# Patient Record
Sex: Male | Born: 1950 | Race: White | Hispanic: No | Marital: Single | Smoking: Former smoker
Health system: Southern US, Community
[De-identification: ages and names within clinical notes are randomized; demographics above are authoritative.]

## PROBLEM LIST (undated history)

## (undated) DIAGNOSIS — J189 Pneumonia, unspecified organism: Secondary | ICD-10-CM

## (undated) DIAGNOSIS — C801 Malignant (primary) neoplasm, unspecified: Secondary | ICD-10-CM

## (undated) DIAGNOSIS — J449 Chronic obstructive pulmonary disease, unspecified: Secondary | ICD-10-CM

## (undated) DIAGNOSIS — Z87442 Personal history of urinary calculi: Secondary | ICD-10-CM

## (undated) DIAGNOSIS — J40 Bronchitis, not specified as acute or chronic: Secondary | ICD-10-CM

## (undated) DIAGNOSIS — I10 Essential (primary) hypertension: Secondary | ICD-10-CM

## (undated) DIAGNOSIS — O2230 Deep phlebothrombosis in pregnancy, unspecified trimester: Secondary | ICD-10-CM

## (undated) HISTORY — PX: TONSILLECTOMY: SUR1361

---

## 2015-05-10 DIAGNOSIS — Z87442 Personal history of urinary calculi: Secondary | ICD-10-CM | POA: Insufficient documentation

## 2015-05-10 DIAGNOSIS — I10 Essential (primary) hypertension: Secondary | ICD-10-CM | POA: Insufficient documentation

## 2017-04-09 DIAGNOSIS — J449 Chronic obstructive pulmonary disease, unspecified: Secondary | ICD-10-CM | POA: Insufficient documentation

## 2017-04-09 DIAGNOSIS — I509 Heart failure, unspecified: Secondary | ICD-10-CM | POA: Insufficient documentation

## 2017-04-09 DIAGNOSIS — I2699 Other pulmonary embolism without acute cor pulmonale: Secondary | ICD-10-CM | POA: Insufficient documentation

## 2017-05-24 DIAGNOSIS — N183 Chronic kidney disease, stage 3 unspecified: Secondary | ICD-10-CM | POA: Insufficient documentation

## 2019-01-07 DIAGNOSIS — Z87891 Personal history of nicotine dependence: Secondary | ICD-10-CM | POA: Insufficient documentation

## 2019-02-04 ENCOUNTER — Other Ambulatory Visit: Payer: Self-pay | Admitting: Urology

## 2019-02-10 ENCOUNTER — Other Ambulatory Visit: Payer: Self-pay | Admitting: Urology

## 2019-02-11 NOTE — Patient Instructions (Addendum)
DUE TO COVID-19 ONLY ONE VISITOR IS ALLOWED TO COME WITH YOU AND STAY IN THE WAITING ROOM ONLY DURING PRE OP AND PROCEDURE DAY OF SURGERY. THE 1 VISITOR MAY VISIT WITH YOU AFTER SURGERY IN YOUR PRIVATE ROOM DURING VISITING HOURS ONLY!  YOU NEED TO HAVE A COVID 19 TEST ON__Monday 01/04/2021_____ @__1045  am____, THIS TEST MUST BE DONE BEFORE SURGERY, COME  [address], [place] , [address].  ([place]) ONCE YOUR COVID TEST IS COMPLETED, PLEASE BEGIN THE QUARANTINE INSTRUCTIONS AS OUTLINED IN YOUR HANDOUT.                Kenneth Fischer    Your procedure is scheduled on: Thursday 02/20/2019   Report to [facility] Main  Entrance    Report to admitting at   [place] AM     Call this number if you have problems the morning of surgery [phone]    Remember: Do not eat food  :After Midnight on Tuesday 02/18/2019 as you will be following a Bowel prep from Dr. Lynne Logan office on Wednesday  02/19/2019 and a clear liquid diet!              Follow the Bowel prep instructions from Dr. Lynne Logan office on Wednesday  02/19/2019.              Drink one-8 ounce bottle of Magnesium Citrate by noon on Wednesday 02/19/2019 and drink clear liquids all day up until midnight!    CLEAR LIQUID DIET   Foods Allowed                                                                     Foods Excluded  Coffee and tea, regular and decaf                             liquids that you cannot  Plain Jell-O any favor except red or purple                                           see through such as: Fruit ices (not with fruit pulp)                                     milk, soups, orange juice  Iced Popsicles                                    All solid food Carbonated beverages, regular and diet                                    Cranberry, grape and apple juices Sports drinks like Gatorade Lightly seasoned clear broth or consume(fat free) Sugar, honey syrup  Sample Menu Breakfast  Lunch                                     Supper Cranberry juice                    Beef broth                            Chicken broth Jell-O                                     Grape juice                           Apple juice Coffee or tea                        Jell-O                                      Popsicle                                                Coffee or tea                        Coffee or tea  _____________________________________________________________________     BRUSH YOUR TEETH MORNING OF SURGERY AND RINSE YOUR MOUTH OUT, NO CHEWING GUM CANDY OR MINTS.     Take these medicines the morning of surgery with A SIP OF WATER: Carvedilol (Coreg)                                 You may not have any metal on your body including hair pins and              piercings  Do not wear jewelry, make-up, lotions, powders or perfumes, deodorant                      Men may shave face and neck.   Do not bring valuables to the hospital. [place].  Contacts, dentures or bridgework may not be worn into surgery.  Leave suitcase in the car. After surgery it may be brought to your room.                  Please read over the following fact sheets you were given: _____________________________________________________________________             [facility] - Preparing for Surgery Before surgery, you can play an important role.  Because skin is not sterile, your skin needs to be as free of germs as possible.  You can reduce the number of germs on your skin by washing with CHG (chlorahexidine gluconate) soap before surgery.  CHG is an antiseptic cleaner which kills germs and bonds with the skin to continue killing  germs even after washing. Please DO NOT use if you have an allergy to CHG or antibacterial soaps.  If your skin becomes reddened/irritated stop using the CHG and inform your nurse when you arrive at  Short Stay. Do not shave (including legs and underarms) for at least 48 hours prior to the first CHG shower.  You may shave your face/neck. Please follow these instructions carefully:  1.  Shower with CHG Soap the night before surgery and the  morning of Surgery.  2.  If you choose to wash your hair, wash your hair first as usual with your  normal  shampoo.  3.  After you shampoo, rinse your hair and body thoroughly to remove the  shampoo.                           4.  Use CHG as you would any other liquid soap.  You can apply chg directly  to the skin and wash                       Gently with a scrungie or clean washcloth.  5.  Apply the CHG Soap to your body ONLY FROM THE NECK DOWN.   Do not use on face/ open                           Wound or open sores. Avoid contact with eyes, ears mouth and genitals (private parts).                       Wash face,  Genitals (private parts) with your normal soap.             6.  Wash thoroughly, paying special attention to the area where your surgery  will be performed.  7.  Thoroughly rinse your body with warm water from the neck down.  8.  DO NOT shower/wash with your normal soap after using and rinsing off  the CHG Soap.                9.  Pat yourself dry with a clean towel.            10.  Wear clean pajamas.            11.  Place clean sheets on your bed the night of your first shower and do not  sleep with pets. Day of Surgery : Do not apply any lotions/deodorants the morning of surgery.  Please wear clean clothes to the hospital/surgery center.  FAILURE TO FOLLOW THESE INSTRUCTIONS MAY RESULT IN THE CANCELLATION OF YOUR SURGERY PATIENT SIGNATURE_________________________________  NURSE SIGNATURE__________________________________  ________________________________________________________________________   Kenneth Fischer  An incentive spirometer is a tool that can help keep your lungs clear and active. This tool measures how well you are  filling your lungs with each breath. Taking long deep breaths may help reverse or decrease the chance of developing breathing (pulmonary) problems (especially infection) following:  A long period of time when you are unable to move or be active. BEFORE THE PROCEDURE   If the spirometer includes an indicator to show your best effort, your nurse or respiratory therapist will set it to a desired goal.  If possible, sit up straight or lean slightly forward. Try not to slouch.  Hold the incentive spirometer in an upright position. INSTRUCTIONS FOR USE  1. Sit on  the edge of your bed if possible, or sit up as far as you can in bed or on a chair. 2. Hold the incentive spirometer in an upright position. 3. Breathe out normally. 4. Place the mouthpiece in your mouth and seal your lips tightly around it. 5. Breathe in slowly and as deeply as possible, raising the piston or the ball toward the top of the column. 6. Hold your breath for 3-5 seconds or for as long as possible. Allow the piston or ball to fall to the bottom of the column. 7. Remove the mouthpiece from your mouth and breathe out normally. 8. Rest for a few seconds and repeat Steps 1 through 7 at least 10 times every 1-2 hours when you are awake. Take your time and take a few normal breaths between deep breaths. 9. The spirometer may include an indicator to show your best effort. Use the indicator as a goal to work toward during each repetition. 10. After each set of 10 deep breaths, practice coughing to be sure your lungs are clear. If you have an incision (the cut made at the time of surgery), support your incision when coughing by placing a pillow or rolled up towels firmly against it. Once you are able to get out of bed, walk around indoors and cough well. You may stop using the incentive spirometer when instructed by your caregiver.  RISKS AND COMPLICATIONS  Take your time so you do not get dizzy or light-headed.  If you are in pain,  you may need to take or ask for pain medication before doing incentive spirometry. It is harder to take a deep breath if you are having pain. AFTER USE  Rest and breathe slowly and easily.  It can be helpful to keep track of a log of your progress. Your caregiver can provide you with a simple table to help with this. If you are using the spirometer at home, follow these instructions: [place] IF:   You are having difficultly using the spirometer.  You have trouble using the spirometer as often as instructed.  Your pain medication is not giving enough relief while using the spirometer.  You develop fever of 100.5 F (38.1 C) or higher. SEEK IMMEDIATE MEDICAL CARE IF:   You cough up bloody sputum that had not been present before.  You develop fever of 102 F (38.9 C) or greater.  You develop worsening pain at or near the incision site. MAKE SURE YOU:   Understand these instructions.  Will watch your condition.  Will get help right away if you are not doing well or get worse. Document Released: 06/12/2006 Document Revised: 04/24/2011 Document Reviewed: 08/13/2006 Exit[employer] Patient Information 2014 Exit[employer], [place].   ________________________________________________________________________  WHAT IS A BLOOD TRANSFUSION? Blood Transfusion Information  A transfusion is the replacement of blood or some of its parts. Blood is made up of multiple cells which provide different functions.  Red blood cells carry oxygen and are used for blood loss replacement.  White blood cells fight against infection.  Platelets control bleeding.  Plasma helps clot blood.  Other blood products are available for specialized needs, such as hemophilia or other clotting disorders. BEFORE THE TRANSFUSION  Who gives blood for transfusions?   Healthy volunteers who are fully evaluated to make sure their blood is safe. This is blood bank blood. Transfusion therapy is the safest it has ever  been in the practice of medicine. Before blood is taken from a donor, a complete  history is taken to make sure that person has no history of diseases nor engages in risky social behavior (examples are intravenous drug use or sexual activity with multiple partners). The donor's travel history is screened to minimize risk of transmitting infections, such as malaria. The donated blood is tested for signs of infectious diseases, such as HIV and hepatitis. The blood is then tested to be sure it is compatible with you in order to minimize the chance of a transfusion reaction. If you or a relative donates blood, this is often done in anticipation of surgery and is not appropriate for emergency situations. It takes many days to process the donated blood. RISKS AND COMPLICATIONS Although transfusion therapy is very safe and saves many lives, the main dangers of transfusion include:   Getting an infectious disease.  Developing a transfusion reaction. This is an allergic reaction to something in the blood you were given. Every precaution is taken to prevent this. The decision to have a blood transfusion has been considered carefully by your caregiver before blood is given. Blood is not given unless the benefits outweigh the risks. AFTER THE TRANSFUSION  Right after receiving a blood transfusion, you will usually feel much better and more energetic. This is especially true if your red blood cells have gotten low (anemic). The transfusion raises the level of the red blood cells which carry oxygen, and this usually causes an energy increase.  The nurse administering the transfusion will monitor you carefully for complications. HOME CARE INSTRUCTIONS  No special instructions are needed after a transfusion. You may find your energy is better. Speak with your caregiver about any limitations on activity for underlying diseases you may have. SEEK MEDICAL CARE IF:   Your condition is not improving after your  transfusion.  You develop redness or irritation at the intravenous (IV) site. SEEK IMMEDIATE MEDICAL CARE IF:  Any of the following symptoms occur over the next 12 hours:  Shaking chills.  You have a temperature by mouth above 102 F (38.9 C), not controlled by medicine.  Chest, back, or muscle pain.  People around you feel you are not acting correctly or are confused.  Shortness of breath or difficulty breathing.  Dizziness and fainting.  You get a rash or develop hives.  You have a decrease in urine output.  Your urine turns a dark color or changes to pink, red, or brown. Any of the following symptoms occur over the next 10 days:  You have a temperature by mouth above 102 F (38.9 C), not controlled by medicine.  Shortness of breath.  Weakness after normal activity.  The white part of the eye turns yellow (jaundice).  You have a decrease in the amount of urine or are urinating less often.  Your urine turns a dark color or changes to pink, red, or brown. Document Released: 01/28/2000 Document Revised: 04/24/2011 Document Reviewed: 09/16/2007 [facility] Patient Information 2014 [place], [place].  _______________________________________________________________________

## 2019-02-13 ENCOUNTER — Encounter (HOSPITAL_COMMUNITY)
Admission: RE | Admit: 2019-02-13 | Discharge: 2019-02-13 | Disposition: A | Discharge status: Home / Self Care | Payer: Medicare Other | Source: Ambulatory Visit | Attending: Urology | Admitting: Urology

## 2019-02-13 ENCOUNTER — Encounter (HOSPITAL_COMMUNITY): Payer: Self-pay

## 2019-02-13 ENCOUNTER — Other Ambulatory Visit: Payer: Self-pay

## 2019-02-13 DIAGNOSIS — J449 Chronic obstructive pulmonary disease, unspecified: Secondary | ICD-10-CM | POA: Diagnosis not present

## 2019-02-13 DIAGNOSIS — Z01818 Encounter for other preprocedural examination: Secondary | ICD-10-CM | POA: Diagnosis present

## 2019-02-13 DIAGNOSIS — Z7982 Long term (current) use of aspirin: Secondary | ICD-10-CM | POA: Insufficient documentation

## 2019-02-13 DIAGNOSIS — Z7901 Long term (current) use of anticoagulants: Secondary | ICD-10-CM | POA: Diagnosis not present

## 2019-02-13 DIAGNOSIS — Z86718 Personal history of other venous thrombosis and embolism: Secondary | ICD-10-CM | POA: Insufficient documentation

## 2019-02-13 DIAGNOSIS — Z87891 Personal history of nicotine dependence: Secondary | ICD-10-CM | POA: Insufficient documentation

## 2019-02-13 DIAGNOSIS — Z86711 Personal history of pulmonary embolism: Secondary | ICD-10-CM | POA: Insufficient documentation

## 2019-02-13 DIAGNOSIS — Z79899 Other long term (current) drug therapy: Secondary | ICD-10-CM | POA: Diagnosis not present

## 2019-02-13 DIAGNOSIS — D49512 Neoplasm of unspecified behavior of left kidney: Secondary | ICD-10-CM | POA: Diagnosis not present

## 2019-02-13 HISTORY — DX: Essential (primary) hypertension: I10

## 2019-02-13 HISTORY — DX: Pneumonia, unspecified organism: J18.9

## 2019-02-13 HISTORY — DX: Malignant (primary) neoplasm, unspecified: C80.1

## 2019-02-13 HISTORY — DX: Personal history of urinary calculi: Z87.442

## 2019-02-13 HISTORY — DX: Chronic obstructive pulmonary disease, unspecified: J44.9

## 2019-02-13 NOTE — Progress Notes (Addendum)
PCP - Dr. Almon Register Cardiologist - Dr. Tonna Boehringer Jasper 01/16/2019 on chart Office visit from 01/07/2019 on chart.  Chest x-ray - n/a EKG - 01/16/2019  From [facility] Stress Test - n/a ECHO - 01/16/2019 on chart from [facility] in [place], [place]. Cardiac Cath - n/a  Sleep Study -  CPAP -   Fasting Blood Sugar -  Checks Blood Sugar _____ times a day  Blood Thinner Instructions:Eliquis Instructed to not take the Sunday evening dose and hold it 72 hours prior to surgery -Last dose to be the morning dose on 02/16/2019. Instructed by [facility]. Aspirin Instructions:stop 5 days prior to surgery Last Dose:Aspirin 81 mg 02/14/2019  Anesthesia review:  Chart given to Konrad Felix, PA to review medical history.  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  Patient denies shortness of breath, fever, cough and chest pain at PAT appointment   Patient verbalized understanding of instructions that were given to them at the PAT appointment. Patient was also instructed that they will need to review over the PAT instructions again at home before surgery.

## 2019-02-13 NOTE — Progress Notes (Signed)
[facility] to request EKG tracing and there was No answer. I will try back on Monday 02/17/2019.

## 2019-02-15 ENCOUNTER — Other Ambulatory Visit (HOSPITAL_COMMUNITY): Payer: Self-pay

## 2019-02-17 ENCOUNTER — Other Ambulatory Visit: Payer: Self-pay

## 2019-02-17 ENCOUNTER — Encounter (HOSPITAL_COMMUNITY)
Admission: RE | Admit: 2019-02-17 | Discharge: 2019-02-17 | Disposition: A | Discharge status: Home / Self Care | Payer: Medicare Other | Source: Ambulatory Visit | Attending: Urology | Admitting: Urology

## 2019-02-17 ENCOUNTER — Other Ambulatory Visit (HOSPITAL_COMMUNITY)
Admission: RE | Admit: 2019-02-17 | Discharge: 2019-02-17 | Disposition: A | Discharge status: Home / Self Care | Payer: Medicare Other | Source: Ambulatory Visit | Attending: Urology | Admitting: Urology

## 2019-02-17 DIAGNOSIS — Z20822 Contact with and (suspected) exposure to covid-19: Secondary | ICD-10-CM | POA: Diagnosis not present

## 2019-02-17 DIAGNOSIS — Z01812 Encounter for preprocedural laboratory examination: Secondary | ICD-10-CM | POA: Diagnosis present

## 2019-02-17 DIAGNOSIS — I10 Essential (primary) hypertension: Secondary | ICD-10-CM | POA: Insufficient documentation

## 2019-02-17 DIAGNOSIS — Z01810 Encounter for preprocedural cardiovascular examination: Secondary | ICD-10-CM | POA: Diagnosis present

## 2019-02-17 LAB — MAGNESIUM: Magnesium: 2.2 mg/dL (ref 1.7–2.4)

## 2019-02-17 LAB — ABO/RH: ABO/RH(D): B POS

## 2019-02-17 LAB — BASIC METABOLIC PANEL
Anion gap: 8 (ref 5–15)
BUN: 15 mg/dL (ref 8–23)
CO2: 31 mmol/L (ref 22–32)
Calcium: 9.3 mg/dL (ref 8.9–10.3)
Chloride: 103 mmol/L (ref 98–111)
Creatinine, Ser: 0.96 mg/dL (ref 0.61–1.24)
GFR calc Af Amer: >60 mL/min (ref >60)
GFR calc non Af Amer: >60 mL/min (ref >60)
Glucose, Bld: 103 mg/dL — ABNORMAL HIGH (ref 70–99)
Potassium: 4.5 mmol/L (ref 3.5–5.1)
Sodium: 142 mmol/L (ref 135–145)

## 2019-02-17 LAB — CBC
HCT: 51.5 % (ref 39.0–52.0)
Hemoglobin: 16.2 g/dL (ref 13.0–17.0)
MCH: 28.2 pg (ref 26.0–34.0)
MCHC: 31.5 g/dL (ref 30.0–36.0)
MCV: 89.6 fL (ref 80.0–100.0)
Platelets: 249 10*3/uL (ref 150–400)
RBC: 5.75 MIL/uL (ref 4.22–5.81)
RDW: 14.5 % (ref 11.5–15.5)
WBC: 5.6 10*3/uL (ref 4.0–10.5)
nRBC: 0 % (ref 0.0–0.2)

## 2019-02-17 NOTE — Progress Notes (Signed)
Anesthesia Chart Review   Case: [id number] Date/Time: 02/20/19 1100   Procedure: XI ROBOTIC ASSITED LAPAROSCOPIC PARTIAL NEPHRECTOMY (Left )   Anesthesia type: General   Pre-op diagnosis: LEFT RENAL NEOPLASM   Location: [facility] ROOM 03 / [facility]   Surgeons: Raynelle Bring, MD      DISCUSSION:69 y.o. 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 Raynelle Bring.   Pt last seen by cardiologist, Dr. Kizzie Ide, 01/16/2019.  Per OV note, "Continue current medications, continue heart healthy diet, activity as tolerated and: I think the patient's perioperative risk will be only mildly elevated secondary to his COPD.  Patient will need blood work to explain new atrial bigeminy and delivered electrolyte abnormality.  We will also expedite he is sent TEE.  If ejection fraction is mildly to at most moderately depressed no additional testing will be planned prior to potential surgery."  Echo 01/17/2019 with EF 55-60%.    Per cardiologist Eliquis can be held 48 hours and if required by surgery 72 hours.  VS: BP (!) 153/86 (BP Location: Left Arm)   Pulse 81   Temp 36.6 C (Oral)   Resp 18   Ht 6' (1.829 m)   Wt 100 kg   SpO2 93%   BMI 29.91 kg/m   PROVIDERS: [name], MD is PCP   Dr. Kizzie Ide is Cardiologist in [place], [place]: Labs reviewed: Acceptable for surgery. (all labs ordered are listed, but only abnormal results are displayed)  Labs Reviewed  BASIC METABOLIC PANEL - Abnormal; Notable for the following components:      Result Value   Glucose, Bld 103 (*)    All other components within normal limits  CBC  MAGNESIUM  TYPE AND SCREEN  ABO/RH     IMAGES:   EKG: 02/17/2019 Rate 79 bpm  Difficult baseline but appears to be NSR Septal infarct, age undetermined  CV: Echo  01/17/2019 Summary 1. Overall left ventricular ejection fraction is estimated at 55 to 60%.  2. Normal global left ventricular systolic function  3. (Grade I) Mildly abnormal left ventricular diastolic filling.  4. Mild concentric left ventricular hypertrophy 5. There is no evidence of pericardial effusion  6. Mild mitral anular calcification 7.  No intracardiac thrombi, mass or vegetations Past Medical History:  Diagnosis Date  . Cancer [facility])    right renal neoplasm  . COPD (chronic obstructive pulmonary disease) ([place])   . History of kidney stones   . Hypertension   . Pneumonia     Past Surgical History:  Procedure Laterality Date  . TONSILLECTOMY      MEDICATIONS: . albuterol (VENTOLIN HFA) 108 (90 Base) MCG/ACT inhaler  . apixaban (ELIQUIS) 5 MG TABS tablet  . aspirin 81 MG chewable tablet  . carvedilol (COREG) 12.5 MG tablet  . furosemide (LASIX) 20 MG tablet  . sacubitril-valsartan (ENTRESTO) 49-51 MG   No current facility-administered medications for this encounter.    Maia Plan [facility] Pre-Surgical Testing [phone] 02/17/19  3:45 PM

## 2019-02-18 LAB — NOVEL CORONAVIRUS, NAA (HOSP ORDER, SEND-OUT TO REF LAB; TAT 18-24 HRS): SARS-CoV-2, NAA: NOT DETECTED

## 2019-02-19 NOTE — H&P (Signed)
$'@media'V$  print    Office Visit Report 01/21/2019    Kenneth Fischer         MRN: [id number] PRIMARY CARE:     REFERRING:  Aloha Gell. Hurt, MD  DOB: Feb 19, 1950, 69 year old Male PROVIDER:  Raynelle Bring, M.D.  SSN:  LOCATION:  [facility] [phone]    CC/HPI: CC: Left renal neoplasms  Physician requesting consult: Dr. Roxanne Gates   Kenneth Fischer is a 69 year old gentleman seen today in follow up in the company of his brother in consultation for bilateral renal masses at the request of Dr. Lerry Liner. He initially saw me in early November in consultation. 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 In September, he underwent a CT angiogram of the chest reportedly. This was performed to see if he could stop his anticoagulation. Unfortunately, the cardiologist that was following him for this problem had moved and so he remained on Eliquis without scheduled follow-up for this problem. Incidentally, he was apparently noted to have a tumor in the upper pole of the right kidney on a CT angiogram. He was seen by Dr. Lerry Liner in underwent a CT scan of the abdomen and pelvis with and without IV contrast. By report, this had confirmed a 4.7 x 4.5 mm enhancing solid mass in the upper pole of the right kidney as well as a 13 mm mass in the upper pole of the left kidney also potentially suspicious for a small renal malignancy. There is no suggestion of lymphadenopathy or other evidence of metastatic disease. His CT angiogram of the chest did not demonstrate any pulmonary lesions and the previously noted pulmonary emboli appeared to be resolved. I did not have his imaging when I initially saw him. Since I have received his actual CT and been able to review it, his mass is noted to actually be in the LEFT upper pole (NOT RIGHT). There appear to be two renal arteries.   His medical comorbidities include a history of COPD. He quit smoking approximately 30 years ago. He also has a history  of hypertension, DVT/PE, and arthritis. He does live independently but is also cared for by his brother. He has denied any hematuria, unintentional weight loss, night sweats, or flank pain. There is no family history of kidney cancer. There is a family and personal history of urolithiasis.   He has seen cardiology since his last visit and follows up today to discuss a plan for treatment of his left sided renal masses. He was seen by Dr. Julian Reil and underwent evaluation including echocardiogram. This demonstrated preserved left ventricular function with an ejection fraction of 55%. He is felt to be acceptable risk to proceed with surgery. He also has been noted to have a history of atrial bigeminy. It was recommended that he have his electrolytes checked preoperatively including a magnesium level to optimize his electrolytes. Unfortunately, there is no mention of recommendations with regard to his anticoagulation. On further questioning Kenneth Fischer and his family today, it appears that his pulmonary embolus actually occurred in 2019. He had remained anticoagulated until September of this year when he underwent a CT angiogram presumably to assess whether he could stop anticoagulation. This did not demonstrate evidence of acute pulmonary emboli. However, there were areas not well opacified on his scan within the left mid lung and left lung base raising question of chronic pulmonary emboli or focal bronchiectasis. There is no mention of a plan  for his anticoagulation from his cardiology note.   Family history of kidney cancer: None.  Family history of ESRD: None.   Imaging: CT scan (11/27/18)  Side of renal neoplasms: Left  Size of renal neoplasms: 4.7 cm and 1.3 cm  Location of renal neoplasm: Left upper pole  Exophytic or endophytic: Exophytic   Renal artery anatomy: Two left renal arteries  Renal vein anatomy: Single renal vein   Contralateral renal lesions: None.  Regional lymphadenopathy:  None.  Adrenal masses: None.  Renal vein/IVC involvement: No.  Metastatic disease to the abdomen: No.   Chest imaging: No metastatic lesions.  LFTs: Normal. Alk Phosphatase (150, NL upper limit is 147)   Baseline renal function: Cr 0.6, eGFR > 60 ml/min   PMH: Past medical history is outlined above.  PSH: No abdominal surgeries.     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  Does not drink anymore.  Drinks 1 caffeinated drink per day.     REVIEW OF SYSTEMS:     GU Review Male:  Patient denies frequent urination, hard to postpone urination, burning/ pain with urination, get up at night to urinate, leakage of urine, stream starts and stops, trouble starting your streams, and have to strain to urinate .    Gastrointestinal (Upper):  Patient denies nausea and vomiting.    Gastrointestinal (Lower):  Patient denies diarrhea and constipation.    Constitutional:  Patient denies fever, night sweats, weight loss, and fatigue.    Skin:  Patient denies skin rash/ lesion and itching.    Eyes:  Patient denies blurred vision and double vision.    Ears/ Nose/ Throat:  Patient denies sinus problems and sore throat.    Hematologic/Lymphatic:  Patient denies swollen glands and easy bruising.    Cardiovascular:  Patient denies leg swelling and chest pains.    Respiratory:  Patient denies cough and shortness of breath.    Endocrine:  Patient denies excessive thirst.      Musculoskeletal:  Patient denies back pain and joint pain.    Neurological:  Patient denies headaches and dizziness.    Psychologic:  Patient denies depression and anxiety.    VITAL SIGNS:       01/21/2019 07:54 AM     Weight 220 lb / 99.79 kg     Height 72 in / 182.88 cm     BP 196/72 mmHg     Pulse 80 /min     Temperature 97.8 F / 36.5 C     BMI 29.8 kg/m     MULTI-SYSTEM PHYSICAL EXAMINATION:      Constitutional: Well-nourished. No physical deformities. Normally developed. Good grooming.     Neck: Neck symmetrical, not swollen. Normal tracheal position.     Respiratory: No labored breathing, no use of accessory muscles. Clear bilaterally.     Cardiovascular: Normal temperature, normal extremity pulses, no  swelling, no varicosities. Regular rate and rhythm.     Lymphatic: No enlargement of neck, axillae, groin.     Skin: No paleness, no jaundice, no cyanosis. No lesion, no ulcer, no rash.     Neurologic / Psychiatric: Oriented to time, oriented to place, oriented to person. No depression, no anxiety, no agitation.     Gastrointestinal: No mass, no tenderness, no rigidity, obese abdomen.      Eyes: Normal conjunctivae. Normal eyelids.     Ears, Nose, Mouth, and Throat: Left ear no scars, no lesions, no masses. Right ear no scars, no lesions, no masses. Nose no scars, no lesions, no masses. Normal hearing. Normal lips.     Musculoskeletal: Normal gait and station of head and neck.            PAST DATA REVIEWED:   Source Of History:  Patient  Lab Test Review:  CMP  X-Ray Review: C.T. Chest/ Abd/Pelvis: Reviewed Films.     Notes:  His creatinine was 0.6, LFTs normal. I have independently reviewed his CT scan of the chest as well as his CT scan of the abdomen and pelvis. Findings are as dictated above.   PROCEDURES: None   ASSESSMENT:     ICD-10 Details  1 GU:  Left renal neoplasm - D49.512    PLAN:   Schedule  Return Visit/Planned Activity: Other See Visit Notes  Note:  Will call to schedule surgery.  Document  Letter(s):  Created for Patient: Clinical Summary   Notes:  1. 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 Since his last visit, I have been able to independently review his imaging studies. This confirmed that both of his renal masses were in fact on his left kidney. It appeared that there was a transcription error on his report of his CT scan of the abdomen. The patient was provided information regarding their renal mass including the relative risk of benign versus malignant pathology and the natural history of renal cell carcinoma and other possible malignancies of the kidney. The role of renal biopsy, laboratory testing, and imaging studies to further characterize renal masses and/or the presence of metastatic disease were explained. We discussed the role of active surveillance, surgical therapy with both radical nephrectomy and nephron-sparing surgery, and ablative therapy in the treatment of renal masses. In addition, we discussed our goals of providing an accurate diagnosis and oncologic control while maintaining optimal renal function as appropriate based on the size, location, and complexity of their renal mass as well as their co-morbidities.   We have discussed the risks of treatment in detail including but not limited to bleeding, infection, heart attack, stroke, death, venothromoboembolism, cancer recurrence, injury/damage to surrounding organs and structures, urine leak, the possibility of open surgical conversion for patients undergoing minimally invasive surgery, the risk of developing chronic kidney disease and its associated implications, and the potential risk of end stage renal disease possibly necessitating dialysis.   He would appear to be at moderately low risk to proceed with surgery from a cardiac standpoint according to his recent  note. However, the issue regarding his anticoagulation has not yet been fully at rest. I will plan to contact his cardiologist today to get further recommendations regarding how to manage his anticoagulation perioperatively. He understands that this is a high risk procedure for bleeding and he would need to discontinue his anticoagulation preoperatively and likely remain off anticoagulation  for approximately 1 week postoperatively.   Once we have final confirmation about his anticoagulation, he will be scheduled for a left robot assisted laparoscopic partial nephrectomy with plans removed both of his left renal masses.   cc: Dr. Roxanne Gates  Dr. Julian Reil   E & M CODES: I spent at least 48 minutes face to face with the patient, more than 50% of that time was spent on counseling and/or coordinating care.   * Signed by Raynelle Bring, M.D. on 01/21/19 at 4:13 PM (EST)*

## 2019-02-20 ENCOUNTER — Inpatient Hospital Stay (HOSPITAL_COMMUNITY)
Admission: RE | Admit: 2019-02-20 | Discharge: 2019-02-21 | DRG: 658 | Disposition: A | Discharge status: Home / Self Care | Payer: Medicare Other | Attending: Urology | Admitting: Urology

## 2019-02-20 ENCOUNTER — Ambulatory Visit (HOSPITAL_COMMUNITY): Payer: Medicare Other | Admitting: Physician Assistant

## 2019-02-20 ENCOUNTER — Ambulatory Visit (HOSPITAL_COMMUNITY): Payer: Medicare Other | Admitting: Certified Registered Nurse Anesthetist

## 2019-02-20 ENCOUNTER — Encounter (HOSPITAL_COMMUNITY)
Admission: RE | Disposition: A | Discharge status: Home / Self Care | Payer: Self-pay | Source: Home / Self Care | Attending: Urology

## 2019-02-20 ENCOUNTER — Encounter (HOSPITAL_COMMUNITY): Payer: Self-pay | Admitting: Urology

## 2019-02-20 ENCOUNTER — Other Ambulatory Visit: Payer: Self-pay

## 2019-02-20 DIAGNOSIS — Z885 Allergy status to narcotic agent status: Secondary | ICD-10-CM

## 2019-02-20 DIAGNOSIS — M1990 Unspecified osteoarthritis, unspecified site: Secondary | ICD-10-CM | POA: Diagnosis present

## 2019-02-20 DIAGNOSIS — Z86711 Personal history of pulmonary embolism: Secondary | ICD-10-CM

## 2019-02-20 DIAGNOSIS — I10 Essential (primary) hypertension: Secondary | ICD-10-CM | POA: Diagnosis present

## 2019-02-20 DIAGNOSIS — N2889 Other specified disorders of kidney and ureter: Secondary | ICD-10-CM | POA: Diagnosis present

## 2019-02-20 DIAGNOSIS — J449 Chronic obstructive pulmonary disease, unspecified: Secondary | ICD-10-CM | POA: Diagnosis present

## 2019-02-20 DIAGNOSIS — Z820 Family history of epilepsy and other diseases of the nervous system: Secondary | ICD-10-CM

## 2019-02-20 DIAGNOSIS — Z8249 Family history of ischemic heart disease and other diseases of the circulatory system: Secondary | ICD-10-CM

## 2019-02-20 DIAGNOSIS — Z86718 Personal history of other venous thrombosis and embolism: Secondary | ICD-10-CM

## 2019-02-20 DIAGNOSIS — Z7901 Long term (current) use of anticoagulants: Secondary | ICD-10-CM

## 2019-02-20 DIAGNOSIS — Z87891 Personal history of nicotine dependence: Secondary | ICD-10-CM

## 2019-02-20 DIAGNOSIS — D49512 Neoplasm of unspecified behavior of left kidney: Secondary | ICD-10-CM | POA: Diagnosis present

## 2019-02-20 DIAGNOSIS — C642 Malignant neoplasm of left kidney, except renal pelvis: Principal | ICD-10-CM | POA: Diagnosis present

## 2019-02-20 HISTORY — PX: ROBOTIC ASSITED PARTIAL NEPHRECTOMY: SHX6087

## 2019-02-20 LAB — TYPE AND SCREEN
ABO/RH(D): B POS
Antibody Screen: NEGATIVE

## 2019-02-20 LAB — HEMOGLOBIN AND HEMATOCRIT, BLOOD
HCT: 48.8 % (ref 39.0–52.0)
Hemoglobin: 15.3 g/dL (ref 13.0–17.0)

## 2019-02-20 SURGERY — NEPHRECTOMY, PARTIAL, ROBOT-ASSISTED
Anesthesia: General | Laterality: Left

## 2019-02-20 MED ORDER — DOCUSATE SODIUM 100 MG PO CAPS
100.0000 mg | ORAL_CAPSULE | Freq: Two times a day (BID) | ORAL | Status: DC
  Administered 2019-02-20 – 2019-02-21 (×2): 100 mg via ORAL
  Filled 2019-02-20 (×2): qty 1

## 2019-02-20 MED ORDER — ESMOLOL HCL 100 MG/10ML IV SOLN
INTRAVENOUS | Status: AC
  Filled 2019-02-20: qty 10

## 2019-02-20 MED ORDER — HYDROMORPHONE HCL 1 MG/ML IJ SOLN
INTRAMUSCULAR | Status: AC
  Filled 2019-02-20: qty 1

## 2019-02-20 MED ORDER — ALBUMIN HUMAN 5 % IV SOLN: INTRAVENOUS | Status: DC | PRN

## 2019-02-20 MED ORDER — ALBUTEROL SULFATE (2.5 MG/3ML) 0.083% IN NEBU
INHALATION_SOLUTION | RESPIRATORY_TRACT | Status: AC
  Administered 2019-02-20: 2.5 mg via RESPIRATORY_TRACT
  Filled 2019-02-20: qty 3

## 2019-02-20 MED ORDER — PROMETHAZINE HCL 25 MG/ML IJ SOLN: 6.2500 mg | INTRAMUSCULAR | Status: DC | PRN

## 2019-02-20 MED ORDER — ALBUTEROL SULFATE (2.5 MG/3ML) 0.083% IN NEBU: 2.5000 mg | INHALATION_SOLUTION | Freq: Four times a day (QID) | RESPIRATORY_TRACT | Status: DC | PRN

## 2019-02-20 MED ORDER — PROPOFOL 10 MG/ML IV BOLUS
INTRAVENOUS | Status: AC
  Filled 2019-02-20: qty 20

## 2019-02-20 MED ORDER — DEXAMETHASONE SODIUM PHOSPHATE 10 MG/ML IJ SOLN
INTRAMUSCULAR | Status: DC | PRN
  Administered 2019-02-20: 10 mg via INTRAVENOUS

## 2019-02-20 MED ORDER — SODIUM CHLORIDE (PF) 0.9 % IJ SOLN
INTRAMUSCULAR | Status: AC
  Filled 2019-02-20: qty 20

## 2019-02-20 MED ORDER — ONDANSETRON HCL 4 MG/2ML IJ SOLN: 4.0000 mg | INTRAMUSCULAR | Status: DC | PRN

## 2019-02-20 MED ORDER — DEXTROSE-NACL 5-0.45 % IV SOLN: INTRAVENOUS | Status: DC

## 2019-02-20 MED ORDER — LACTATED RINGERS IR SOLN
Status: DC | PRN
  Administered 2019-02-20: 1

## 2019-02-20 MED ORDER — SACUBITRIL-VALSARTAN 49-51 MG PO TABS
1.0000 | ORAL_TABLET | Freq: Two times a day (BID) | ORAL | Status: DC
  Administered 2019-02-20 – 2019-02-21 (×2): 1 via ORAL
  Filled 2019-02-20 (×3): qty 1

## 2019-02-20 MED ORDER — LIDOCAINE 2% (20 MG/ML) 5 ML SYRINGE
INTRAMUSCULAR | Status: AC
  Filled 2019-02-20: qty 5

## 2019-02-20 MED ORDER — TRAMADOL HCL 50 MG PO TABS: 50.0000 mg | ORAL_TABLET | Freq: Four times a day (QID) | ORAL | Status: DC | PRN

## 2019-02-20 MED ORDER — CEFAZOLIN SODIUM-DEXTROSE 1-4 GM/50ML-% IV SOLN
1.0000 g | Freq: Three times a day (TID) | INTRAVENOUS | Status: AC
  Administered 2019-02-20 – 2019-02-21 (×2): 1 g via INTRAVENOUS
  Filled 2019-02-20 (×2): qty 50

## 2019-02-20 MED ORDER — ACETAMINOPHEN 10 MG/ML IV SOLN
1000.0000 mg | Freq: Four times a day (QID) | INTRAVENOUS | Status: DC
  Administered 2019-02-20 – 2019-02-21 (×2): 1000 mg via INTRAVENOUS
  Filled 2019-02-20 (×4): qty 100

## 2019-02-20 MED ORDER — SUGAMMADEX SODIUM 200 MG/2ML IV SOLN
INTRAVENOUS | Status: DC | PRN
  Administered 2019-02-20: 400 mg via INTRAVENOUS

## 2019-02-20 MED ORDER — MEPERIDINE HCL 50 MG/ML IJ SOLN: 6.2500 mg | INTRAMUSCULAR | Status: DC | PRN

## 2019-02-20 MED ORDER — ACETAMINOPHEN 160 MG/5ML PO SOLN: 325.0000 mg | Freq: Once | ORAL | Status: DC | PRN

## 2019-02-20 MED ORDER — OXYBUTYNIN CHLORIDE 5 MG PO TABS: 5.0000 mg | ORAL_TABLET | Freq: Three times a day (TID) | ORAL | Status: DC | PRN

## 2019-02-20 MED ORDER — FENTANYL CITRATE (PF) 100 MCG/2ML IJ SOLN: 25.0000 ug | INTRAMUSCULAR | Status: DC | PRN

## 2019-02-20 MED ORDER — FENTANYL CITRATE (PF) 100 MCG/2ML IJ SOLN
INTRAMUSCULAR | Status: AC
  Filled 2019-02-20: qty 2

## 2019-02-20 MED ORDER — TRAMADOL HCL 50 MG PO TABS: 50.0000 mg | ORAL_TABLET | Freq: Four times a day (QID) | ORAL | 0 refills | Status: DC | PRN

## 2019-02-20 MED ORDER — BUPIVACAINE LIPOSOME 1.3 % IJ SUSP
20.0000 mL | Freq: Once | INTRAMUSCULAR | Status: AC
  Administered 2019-02-20: 12:00:00 20 mL
  Filled 2019-02-20: qty 20

## 2019-02-20 MED ORDER — ROCURONIUM BROMIDE 10 MG/ML (PF) SYRINGE
PREFILLED_SYRINGE | INTRAVENOUS | Status: AC
  Filled 2019-02-20: qty 10

## 2019-02-20 MED ORDER — ONDANSETRON HCL 4 MG/2ML IJ SOLN
INTRAMUSCULAR | Status: DC | PRN
  Administered 2019-02-20: 4 mg via INTRAVENOUS

## 2019-02-20 MED ORDER — DOCUSATE SODIUM 100 MG PO CAPS: 100.0000 mg | ORAL_CAPSULE | Freq: Two times a day (BID) | ORAL | Status: DC

## 2019-02-20 MED ORDER — ROCURONIUM BROMIDE 50 MG/5ML IV SOSY
PREFILLED_SYRINGE | INTRAVENOUS | Status: DC | PRN
  Administered 2019-02-20: 60 mg via INTRAVENOUS
  Administered 2019-02-20: 40 mg via INTRAVENOUS
  Administered 2019-02-20: 20 mg via INTRAVENOUS

## 2019-02-20 MED ORDER — MAGNESIUM CITRATE PO SOLN
1.0000 | Freq: Once | ORAL | Status: DC
  Filled 2019-02-20: qty 296

## 2019-02-20 MED ORDER — ACETAMINOPHEN 325 MG PO TABS: 325.0000 mg | ORAL_TABLET | Freq: Once | ORAL | Status: DC | PRN

## 2019-02-20 MED ORDER — ALBUTEROL SULFATE HFA 108 (90 BASE) MCG/ACT IN AERS: 2.0000 | INHALATION_SPRAY | Freq: Four times a day (QID) | RESPIRATORY_TRACT | Status: DC | PRN

## 2019-02-20 MED ORDER — ACETAMINOPHEN 10 MG/ML IV SOLN
1000.0000 mg | Freq: Once | INTRAVENOUS | Status: DC | PRN
  Administered 2019-02-20: 1000 mg via INTRAVENOUS

## 2019-02-20 MED ORDER — LACTATED RINGERS IV SOLN: INTRAVENOUS | Status: DC

## 2019-02-20 MED ORDER — PHENYLEPHRINE 40 MCG/ML (10ML) SYRINGE FOR IV PUSH (FOR BLOOD PRESSURE SUPPORT)
PREFILLED_SYRINGE | INTRAVENOUS | Status: DC | PRN
  Administered 2019-02-20: 120 ug via INTRAVENOUS
  Administered 2019-02-20 (×2): 80 ug via INTRAVENOUS

## 2019-02-20 MED ORDER — ACETAMINOPHEN 10 MG/ML IV SOLN
INTRAVENOUS | Status: AC
  Filled 2019-02-20: qty 100

## 2019-02-20 MED ORDER — ALBUMIN HUMAN 5 % IV SOLN
INTRAVENOUS | Status: AC
  Filled 2019-02-20: qty 250

## 2019-02-20 MED ORDER — ALBUTEROL SULFATE (2.5 MG/3ML) 0.083% IN NEBU: 2.5000 mg | INHALATION_SOLUTION | Freq: Once | RESPIRATORY_TRACT | Status: AC

## 2019-02-20 MED ORDER — DIPHENHYDRAMINE HCL 50 MG/ML IJ SOLN: 12.5000 mg | Freq: Four times a day (QID) | INTRAMUSCULAR | Status: DC | PRN

## 2019-02-20 MED ORDER — PHENYLEPHRINE HCL-NACL 10-0.9 MG/250ML-% IV SOLN
INTRAVENOUS | Status: DC | PRN
  Administered 2019-02-20: 75 ug/min via INTRAVENOUS

## 2019-02-20 MED ORDER — HYDROMORPHONE HCL 1 MG/ML IJ SOLN
0.2500 mg | INTRAMUSCULAR | Status: DC | PRN
  Administered 2019-02-20: 0.5 mg via INTRAVENOUS

## 2019-02-20 MED ORDER — CEFAZOLIN SODIUM-DEXTROSE 2-4 GM/100ML-% IV SOLN
2.0000 g | Freq: Once | INTRAVENOUS | Status: AC
  Administered 2019-02-20: 11:00:00 2 g via INTRAVENOUS
  Filled 2019-02-20: qty 100

## 2019-02-20 MED ORDER — KETAMINE HCL 10 MG/ML IJ SOLN
INTRAMUSCULAR | Status: DC | PRN
  Administered 2019-02-20: 25 mg via INTRAVENOUS

## 2019-02-20 MED ORDER — FENTANYL CITRATE (PF) 100 MCG/2ML IJ SOLN
INTRAMUSCULAR | Status: DC | PRN
  Administered 2019-02-20: 100 ug via INTRAVENOUS

## 2019-02-20 MED ORDER — DIPHENHYDRAMINE HCL 12.5 MG/5ML PO ELIX: 12.5000 mg | ORAL_SOLUTION | Freq: Four times a day (QID) | ORAL | Status: DC | PRN

## 2019-02-20 MED ORDER — BACITRACIN-NEOMYCIN-POLYMYXIN 400-5-5000 EX OINT: 1.0000 "application " | TOPICAL_OINTMENT | Freq: Three times a day (TID) | CUTANEOUS | Status: DC | PRN

## 2019-02-20 MED ORDER — CARVEDILOL 12.5 MG PO TABS
12.5000 mg | ORAL_TABLET | Freq: Two times a day (BID) | ORAL | Status: DC
  Administered 2019-02-20 – 2019-02-21 (×2): 12.5 mg via ORAL
  Filled 2019-02-20 (×2): qty 1

## 2019-02-20 MED ORDER — SODIUM CHLORIDE (PF) 0.9 % IJ SOLN
INTRAMUSCULAR | Status: DC | PRN
  Administered 2019-02-20: 20 mL

## 2019-02-20 MED ORDER — PROPOFOL 10 MG/ML IV BOLUS
INTRAVENOUS | Status: DC | PRN
  Administered 2019-02-20: 150 mg via INTRAVENOUS

## 2019-02-20 MED ORDER — ALBUTEROL SULFATE HFA 108 (90 BASE) MCG/ACT IN AERS
INHALATION_SPRAY | RESPIRATORY_TRACT | Status: AC
  Filled 2019-02-20: qty 6.7

## 2019-02-20 SURGICAL SUPPLY — 59 items
APPLICATOR SURGIFLO ENDO (HEMOSTASIS) ×3 IMPLANT
BAG LAPAROSCOPIC 12 15 PORT 16 (BASKET) ×1 IMPLANT
BAG RETRIEVAL 12/15 (BASKET) ×2
BAG RETRIEVAL 12/15MM (BASKET) ×1
CHLORAPREP W/TINT 26 (MISCELLANEOUS) ×3 IMPLANT
CLIP VESOLOCK LG 6/CT PURPLE (CLIP) ×9 IMPLANT
CLIP VESOLOCK MED LG 6/CT (CLIP) ×3 IMPLANT
COVER SURGICAL LIGHT HANDLE (MISCELLANEOUS) ×3 IMPLANT
COVER TIP SHEARS 8 DVNC (MISCELLANEOUS) ×1 IMPLANT
COVER TIP SHEARS 8MM DA VINCI (MISCELLANEOUS) ×2
COVER WAND RF STERILE (DRAPES) IMPLANT
CUTTER ECHEON FLEX ENDO 45 340 (ENDOMECHANICALS) ×3 IMPLANT
DECANTER SPIKE VIAL GLASS SM (MISCELLANEOUS) ×3 IMPLANT
DERMABOND ADVANCED (GAUZE/BANDAGES/DRESSINGS) ×2
DERMABOND ADVANCED .7 DNX12 (GAUZE/BANDAGES/DRESSINGS) ×1 IMPLANT
DRAIN CHANNEL 15F RND FF 3/16 (WOUND CARE) IMPLANT
DRAPE ARM DVNC X/XI (DISPOSABLE) ×4 IMPLANT
DRAPE COLUMN DVNC XI (DISPOSABLE) ×1 IMPLANT
DRAPE DA VINCI XI ARM (DISPOSABLE) ×8
DRAPE DA VINCI XI COLUMN (DISPOSABLE) ×2
DRAPE INCISE IOBAN 66X45 STRL (DRAPES) ×3 IMPLANT
DRAPE SHEET LG 3/4 BI-LAMINATE (DRAPES) ×3 IMPLANT
ELECT REM PT RETURN 15FT ADLT (MISCELLANEOUS) ×3 IMPLANT
EVACUATOR SILICONE 100CC (DRAIN) ×3 IMPLANT
GLOVE BIO SURGEON STRL SZ 6.5 (GLOVE) ×2 IMPLANT
GLOVE BIO SURGEONS STRL SZ 6.5 (GLOVE) ×1
GLOVE BIOGEL M STRL SZ7.5 (GLOVE) ×6 IMPLANT
GOWN STRL REUS W/TWL LRG LVL3 (GOWN DISPOSABLE) ×9 IMPLANT
HEMOSTAT SURGICEL 4X8 (HEMOSTASIS) IMPLANT
HOLDER FOLEY CATH W/STRAP (MISCELLANEOUS) ×3 IMPLANT
IRRIG SUCT STRYKERFLOW 2 WTIP (MISCELLANEOUS) ×3
IRRIGATION SUCT STRKRFLW 2 WTP (MISCELLANEOUS) ×1 IMPLANT
KIT BASIN OR (CUSTOM PROCEDURE TRAY) ×3 IMPLANT
KIT TURNOVER KIT A (KITS) ×3 IMPLANT
PENCIL SMOKE EVACUATOR (MISCELLANEOUS) IMPLANT
POUCH SPECIMEN RETRIEVAL 10MM (ENDOMECHANICALS) IMPLANT
PROTECTOR NERVE ULNAR (MISCELLANEOUS) ×6 IMPLANT
SEAL CANN UNIV 5-8 DVNC XI (MISCELLANEOUS) ×4 IMPLANT
SEAL XI 5MM-8MM UNIVERSAL (MISCELLANEOUS) ×8
SET TUBE SMOKE EVAC HIGH FLOW (TUBING) ×3 IMPLANT
SOLUTION ELECTROLUBE (MISCELLANEOUS) ×3 IMPLANT
STAPLE RELOAD 45 WHT (STAPLE) ×1 IMPLANT
STAPLE RELOAD 45MM WHITE (STAPLE) ×2
SURGIFLO W/THROMBIN 8M KIT (HEMOSTASIS) ×3 IMPLANT
SUT ETHILON 3 0 PS 1 (SUTURE) IMPLANT
SUT MNCRL AB 4-0 PS2 18 (SUTURE) ×6 IMPLANT
SUT PDS AB 0 CTX 60 (SUTURE) ×6 IMPLANT
SUT V-LOC BARB 180 2/0GR6 GS22 (SUTURE)
SUT VIC AB 0 CT1 27 (SUTURE) ×2
SUT VIC AB 0 CT1 27XBRD ANTBC (SUTURE) ×1 IMPLANT
SUT VICRYL 0 UR6 27IN ABS (SUTURE) ×3 IMPLANT
SUT VLOC BARB 180 ABS3/0GR12 (SUTURE)
SUTURE V-LC BRB 180 2/0GR6GS22 (SUTURE) IMPLANT
SUTURE VLOC BRB 180 ABS3/0GR12 (SUTURE) IMPLANT
TOWEL OR 17X26 10 PK STRL BLUE (TOWEL DISPOSABLE) ×3 IMPLANT
TRAY FOLEY MTR SLVR 16FR STAT (SET/KITS/TRAYS/PACK) ×3 IMPLANT
TRAY LAPAROSCOPIC (CUSTOM PROCEDURE TRAY) ×3 IMPLANT
TROCAR XCEL 12X100 BLDLESS (ENDOMECHANICALS) ×3 IMPLANT
WATER STERILE IRR 1000ML POUR (IV SOLUTION) ×3 IMPLANT

## 2019-02-20 NOTE — Op Note (Signed)
Preoperative diagnosis: Left renal neoplasms  Postoperative diagnosis: Left renal neoplasms  Procedure:  1. Left robotic-assisted laparoscopic radical nephrectomy  Surgeon: Pryor Curia. M.D.  Assistant(s): Debbrah Alar, PA-C  An assistant was required for this surgical procedure.  The duties of the assistant included but were not limited to suctioning, passing suture, camera manipulation, retraction. This procedure would not be able to be performed without an Environmental consultant.  Anesthesia: General  Complications: None  EBL: 200 mL  IVF:  1000 mL crystalloid, 500 cc colloid  Specimens: 1. Left renal neoplasm  Disposition of specimens: Pathology  Drains: 1. # 15 Blake perinephric drain  Indication:  Kenneth Fischer is a 69 y.o. year old patient with a left renal neoplasm.  After a thorough review of the management options for their renal mass, they elected to proceed with surgical treatment and the above procedure.  We have discussed the potential benefits and risks of the procedure, side effects of the proposed treatment, the likelihood of the patient achieving the goals of the procedure, and any potential problems that might occur during the procedure or recuperation. Informed consent has been obtained.   Description of procedure:  The patient was taken to the operating room and a general anesthetic was administered. The patient was given preoperative antibiotics, placed in the left modified flank position with care to pad all potential pressure points, and prepped and draped in the usual sterile fashion. Next a preoperative timeout was performed.  A site was selected in the upper midline for initial port placement. This was placed using a standard open Hassan technique which allowed entry into the peritoneal cavity under direct vision and without difficulty. A 12 mm port was placed and a pneumoperitoneum established. The camera was then used to inspect the abdomen and there  was no evidence of any intra-abdominal injuries or other abnormalities. The remaining abdominal ports were then placed. 8 mm robotic ports were placed in the left upper quadrant, left lower quadrant, and far left lateral abdominal wall. A 8 mm port was placed to the left of the midline just off the rectus muscle for the camera. All ports were placed under direct vision without difficulty. The surgical cart was then docked.   Utilizing the cautery scissors, the white line of Toldt was incised allowing the colon to be mobilized medially and the plane between the mesocolon and the anterior layer of Gerota's fascia to be developed and the kidney to be exposed.  The ureter and gonadal vein were identified inferiorly and the ureter was lifted anteriorly off the psoas muscle.  Dissection proceeded superiorly along the gonadal vein until the renal vein was identified.  The renal hilum was then carefully isolated with a combination of blunt and sharp dissection allowing the renal arterial and venous structures to be separated and isolated in preparation for renal hilar vessel clamping. There were two renal arteries including a renal artery just above the main renal vein and a superior branching renal artery.  There was a single renal vein.  Attention turned to the kidney and the perinephric fat surrounding the renal mass was removed and the kidney was mobilized sufficiently for exposure and resection of the renal mass.  The perinephric fat was densely adherent to the renal capsule resulting in difficult dissection.  As the tumor was mobilized, it became apparent that identifying proper margins on the kidney tumors would be difficult.  The lateral smaller tumor was seen relatively well but the larger upper pole tumor was  not.  Consideration was then given to a possible heminephrectomy.  After further dissection, it was apparent that this too would prove difficult due to the renal anatomy.  In addition, the patient did  require chronic anticoagulation and would need to restart this postoperatively.  It was felt that a total nephrectomy would be in his best interest.   The renal arteries were ligated with multiple Weck clips and divided.  The renal vein was ligated and divided with a 45 Echelon stapler.  The ureter was then also divided between clips.   All remaining attachments were divided with cautery as needed. Hemostasis was ensured and Surgiflo was applied to the renal fossa.  The surgical cart was undocked.  The kidney specimen was removed intact within an endopouch retrieval bag via the upper midline port site which was extended inferiorly.  All other laparoscopic/robotic ports had been removed under direct vision and the pneumoperitoneum let down with inspection of the operative field performed and hemostasis again confirmed. The upper midline incision was then closed at the fascial level with 0-vicryl suture. All incision sites were then injected with local anesthetic and reapproximated at the skin level with 4-0 monocryl subcuticular closures.  Liquiband was applied to the skin.  The patient tolerated the procedure well and without complications.  The patient was able to be extubated and transferred to the recovery unit in satisfactory condition.  Pryor Curia MD

## 2019-02-20 NOTE — Progress Notes (Signed)
Patient ID: Kenneth Fischer, male   DOB: 01-10-1951, 69 y.o.   MRN: [id number]  Post-op note  Subjective: The patient is doing well.  No complaints.  Objective: Vital signs in last 24 hours: Temp:  [97.6 F (36.4 C)-97.8 F (36.6 C)] 97.6 F (36.4 C) (01/07 1600) Pulse Rate:  [62-84] 62 (01/07 1600) Resp:  [15-25] 23 (01/07 1600) BP: (131-168)/(64-85) 134/71 (01/07 1600) SpO2:  [94 %-100 %] 97 % (01/07 1600) Weight:  [97.8 kg] 97.8 kg (01/07 0919)  Intake/Output from previous day: No intake/output data recorded. Intake/Output this shift: Total I/O In: 1700 [I.V.:1100; IV Piggyback:600] Out: 200 [Blood:200]  Physical Exam:  General: Alert and oriented. Abdomen: Soft, Nondistended. Incisions: Clean and dry.  Lab Results: Recent Labs    02/20/19 1536  HGB 15.3  HCT 48.8    Assessment/Plan: POD#0   1) Continue to monitor, ambulate, IS   Kenneth Fischer. MD   LOS: 0 days   Kenneth Fischer 02/20/2019, 4:59 PM

## 2019-02-20 NOTE — Anesthesia Procedure Notes (Signed)
Procedure Name: Intubation Date/Time: 02/20/2019 11:18 AM Performed by: [place] Regis Wiland C, CRNA Pre-anesthesia Checklist: Patient identified, Emergency Drugs available, Suction available and Patient being monitored Patient Re-evaluated:Patient Re-evaluated prior to induction Oxygen Delivery Method: Circle system utilized Preoxygenation: Pre-oxygenation with 100% oxygen Induction Type: IV induction Ventilation: Mask ventilation without difficulty Laryngoscope Size: Mac and 4 Grade View: Grade I Tube type: Oral Tube size: 7.5 mm Number of attempts: 1 Airway Equipment and Method: Stylet and Oral airway Placement Confirmation: ETT inserted through vocal cords under direct vision,  positive ETCO2 and breath sounds checked- equal and bilateral Secured at: 22 cm Tube secured with: Tape Dental Injury: Teeth and Oropharynx as per pre-operative assessment

## 2019-02-20 NOTE — Transfer of Care (Signed)
Immediate Anesthesia Transfer of Care Note  Patient: Kenneth Fischer  Procedure(s) Performed: XI ROBOTIC ASSITED LAPAROSCOPIC PARTIAL NEPHRECTOMY (Left )  Patient Location: PACU  Anesthesia Type:General  Level of Consciousness: awake, alert  and drowsy  Airway & Oxygen Therapy: Patient Spontanous Breathing and Patient connected to face mask oxygen  Post-op Assessment: Report given to RN and Post -op Vital signs reviewed and stable  Post vital signs: Reviewed and stable  Last Vitals:  Vitals Value Taken Time  BP 160/82 02/20/19 1507  Temp 36.6 C 02/20/19 1507  Pulse 68 02/20/19 1509  Resp 27 02/20/19 1509  SpO2 100 % 02/20/19 1509  Vitals shown include unvalidated device data.  Last Pain:  Vitals:   02/20/19 0929  TempSrc:   PainSc: 0-No pain      Patients Stated Pain Goal: 4 ([id number])  Complications: No apparent anesthesia complications

## 2019-02-20 NOTE — Anesthesia Postprocedure Evaluation (Signed)
Anesthesia Post Note  Patient: Kenneth Fischer  Procedure(s) Performed: XI ROBOTIC ASSITED LAPAROSCOPIC ASSISTED RADICAL NEPHRECTOMY (Left )     Patient location during evaluation: PACU Anesthesia Type: General Level of consciousness: awake and alert Pain management: pain level controlled Vital Signs Assessment: post-procedure vital signs reviewed and stable Respiratory status: spontaneous breathing, nonlabored ventilation, respiratory function stable and patient connected to nasal cannula oxygen Cardiovascular status: blood pressure returned to baseline and stable Postop Assessment: no apparent nausea or vomiting Anesthetic complications: no    Last Vitals:  Vitals:   02/20/19 1600 02/20/19 1700  BP: 134/71 (!) 150/72  Pulse: 62 (!) 59  Resp: (!) 23 16  Temp: 36.4 C (!) 36.4 C  SpO2: 97% 99%    Last Pain:  Vitals:   02/20/19 1700  TempSrc:   PainSc: [place] D Sedrick Tober

## 2019-02-20 NOTE — Interval H&P Note (Signed)
History and Physical Interval Note:  02/20/2019 10:54 AM  Kenneth Fischer  has presented today for surgery, with the diagnosis of LEFT RENAL NEOPLASM.  The various methods of treatment have been discussed with the patient and family. After consideration of risks, benefits and other options for treatment, the patient has consented to  Procedure(s): XI ROBOTIC ASSITED LAPAROSCOPIC PARTIAL NEPHRECTOMY (Left) as a surgical intervention.  The patient's history has been reviewed, patient examined, no change in status, stable for surgery.  I have reviewed the patient's chart and labs.  Questions were answered to the patient's satisfaction.     [employer]

## 2019-02-20 NOTE — Anesthesia Preprocedure Evaluation (Addendum)
Anesthesia Evaluation  Patient identified by MRN, date of birth, ID band Patient awake    Reviewed: Allergy & Precautions, NPO status , Patient's Chart, lab work & pertinent test results  Airway Mallampati: II  TM Distance: >3 FB Neck ROM: Full    Dental  (+) Chipped,    Pulmonary COPD, former smoker,     + decreased breath sounds      Cardiovascular hypertension, Pt. on home beta blockers and Pt. on medications  Rhythm:Regular Rate:Normal     Neuro/Psych negative neurological ROS  negative psych ROS   GI/Hepatic negative GI ROS, Neg liver ROS,   Endo/Other  negative endocrine ROS  Renal/GU negative Renal ROS     Musculoskeletal negative musculoskeletal ROS (+)   Abdominal Normal abdominal exam  (+)   Peds  Hematology negative hematology ROS (+)   Anesthesia Other Findings   Reproductive/Obstetrics                            Anesthesia Physical Anesthesia Plan  ASA: III  Anesthesia Plan: General   Post-op Pain Management:    Induction: Intravenous  PONV Risk Score and Plan: 3 and Ondansetron, Dexamethasone and Midazolam  Airway Management Planned: Oral ETT  Additional Equipment: None  Intra-op Plan:   Post-operative Plan: Extubation in OR  Informed Consent: I have reviewed the patients History and Physical, chart, labs and discussed the procedure including the risks, benefits and alternatives for the proposed anesthesia with the patient or authorized representative who has indicated his/her understanding and acceptance.     Dental advisory given  Plan Discussed with: CRNA  Anesthesia Plan Comments:        Anesthesia Quick Evaluation

## 2019-02-21 DIAGNOSIS — C642 Malignant neoplasm of left kidney, except renal pelvis: Secondary | ICD-10-CM | POA: Diagnosis present

## 2019-02-21 DIAGNOSIS — J449 Chronic obstructive pulmonary disease, unspecified: Secondary | ICD-10-CM | POA: Diagnosis present

## 2019-02-21 DIAGNOSIS — Z8249 Family history of ischemic heart disease and other diseases of the circulatory system: Secondary | ICD-10-CM | POA: Diagnosis not present

## 2019-02-21 DIAGNOSIS — Z86718 Personal history of other venous thrombosis and embolism: Secondary | ICD-10-CM | POA: Diagnosis not present

## 2019-02-21 DIAGNOSIS — D49512 Neoplasm of unspecified behavior of left kidney: Secondary | ICD-10-CM | POA: Diagnosis present

## 2019-02-21 DIAGNOSIS — I10 Essential (primary) hypertension: Secondary | ICD-10-CM | POA: Diagnosis present

## 2019-02-21 DIAGNOSIS — M1990 Unspecified osteoarthritis, unspecified site: Secondary | ICD-10-CM | POA: Diagnosis present

## 2019-02-21 DIAGNOSIS — Z820 Family history of epilepsy and other diseases of the nervous system: Secondary | ICD-10-CM | POA: Diagnosis not present

## 2019-02-21 DIAGNOSIS — Z885 Allergy status to narcotic agent status: Secondary | ICD-10-CM | POA: Diagnosis not present

## 2019-02-21 DIAGNOSIS — Z7901 Long term (current) use of anticoagulants: Secondary | ICD-10-CM | POA: Diagnosis not present

## 2019-02-21 DIAGNOSIS — Z87891 Personal history of nicotine dependence: Secondary | ICD-10-CM | POA: Diagnosis not present

## 2019-02-21 DIAGNOSIS — Z86711 Personal history of pulmonary embolism: Secondary | ICD-10-CM | POA: Diagnosis not present

## 2019-02-21 LAB — BASIC METABOLIC PANEL
Anion gap: 12 (ref 5–15)
BUN: 25 mg/dL — ABNORMAL HIGH (ref 8–23)
CO2: 25 mmol/L (ref 22–32)
Calcium: 8.7 mg/dL — ABNORMAL LOW (ref 8.9–10.3)
Chloride: 100 mmol/L (ref 98–111)
Creatinine, Ser: 1.77 mg/dL — ABNORMAL HIGH (ref 0.61–1.24)
GFR calc Af Amer: 45 mL/min — ABNORMAL LOW (ref >60)
GFR calc non Af Amer: 39 mL/min — ABNORMAL LOW (ref >60)
Glucose, Bld: 126 mg/dL — ABNORMAL HIGH (ref 70–99)
Potassium: 4.5 mmol/L (ref 3.5–5.1)
Sodium: 137 mmol/L (ref 135–145)

## 2019-02-21 LAB — HEMOGLOBIN AND HEMATOCRIT, BLOOD
HCT: 46.4 % (ref 39.0–52.0)
Hemoglobin: 14.7 g/dL (ref 13.0–17.0)

## 2019-02-21 MED ORDER — BISACODYL 10 MG RE SUPP
10.0000 mg | Freq: Once | RECTAL | Status: AC
  Administered 2019-02-21: 10 mg via RECTAL
  Filled 2019-02-21: qty 1

## 2019-02-21 NOTE — Discharge Summary (Signed)
  Date of admission: 02/20/2019  Date of discharge: 02/21/2019  Admission diagnosis: Left renal neoplasms  Discharge diagnosis: Left renal neoplasms  Secondary diagnoses: COPD, hypertension  History and Physical: For full details, please see admission history and physical. Briefly, Kenneth Fischer is a 69 y.o. year old patient with incidentally detected left renal masses.  These were enhancing and concerning for renal cell carcinoma.  He elected to proceed with surgical intervention.  Hospital Course: He was taken to the operating room on 02/20/2019.  He underwent a robot-assisted laparoscopic left renal procedure.  The initial intent was to perform partial nephrectomies for both of his lesions.  However, intraoperatively, it became clear that this would not be optimal from a cancer standpoint or for reconstruction of the kidney.  It was therefore decided to perform a radical nephrectomy.  He tolerated his procedure well and without complications.  Postoperatively, he was able to begin ambulating and his diet was advanced.  By postoperative day 1, he was ambulating well, his pain was controlled, and he was tolerating oral intake.  His pathology was pending at the time of discharge.  His renal function was monitored and will be followed up as an outpatient.  Laboratory values:  Recent Labs    02/20/19 1536 02/21/19 0412  HGB 15.3 14.7  HCT 48.8 46.4   Recent Labs    02/21/19 0412  CREATININE 1.77*    Disposition: Home  Discharge instruction: The patient was instructed to be ambulatory but told to refrain from heavy lifting, strenuous activity, or driving.  He has been instructed that he can resume his anticoagulation tomorrow.  Discharge medications:  Allergies as of 02/21/2019      Reactions   Codeine Hives   Nausea, vomiting, hives    Morphine And Related Hives   Hives, nausea and vomiting      Medication List    STOP taking these medications   apixaban 5 MG Tabs tablet Commonly  known as: ELIQUIS   aspirin 81 MG chewable tablet     TAKE these medications   albuterol 108 (90 Base) MCG/ACT inhaler Commonly known as: VENTOLIN HFA Inhale 2 puffs into the lungs every 6 (six) hours as needed for wheezing or shortness of breath.   carvedilol 12.5 MG tablet Commonly known as: COREG Take 12.5 mg by mouth 2 (two) times daily.   furosemide 20 MG tablet Commonly known as: LASIX Take 20 mg by mouth daily.   sacubitril-valsartan 49-51 MG Commonly known as: ENTRESTO Take 1 tablet by mouth 2 (two) times daily.   traMADol 50 MG tablet Commonly known as: Ultram Take 1-2 tablets (50-100 mg total) by mouth every 6 (six) hours as needed for moderate pain or severe pain.       Followup:  Follow-up Information    Raynelle Bring, MD On 03/18/2019.   Specialty: Urology Why: at 10:45 Contact information: [address] [place] [address] [phone]

## 2019-02-21 NOTE — Progress Notes (Signed)
Pt is still unable to urinate an adequate amount. Bladder scan reveals 24ml (completed by two different staff members). Dr. Alinda Money notified and informed me to keep pushing fluids and bladder scan again if no results.

## 2019-02-21 NOTE — Discharge Instructions (Signed)
1.  Activity:  You are encouraged to ambulate frequently (about every hour during waking hours) to help prevent blood clots from forming in your legs or lungs.  However, you should not engage in any heavy lifting (> 10-15 lbs), strenuous activity, or straining. 2. Diet: You should advance your diet as instructed by your physician.  It will be normal to have some bloating, nausea, and abdominal discomfort intermittently. 3. Prescriptions:  You will be provided a prescription for pain medication to take as needed.  If your pain is not severe enough to require the prescription pain medication, you may take extra strength Tylenol instead which will have less side effects.  You should also take a prescribed stool softener to avoid straining with bowel movements as the prescription pain medication may constipate you. 4. Incisions: You may remove your dressing bandages 48 hours after surgery if not removed in the hospital.  You will either have some small staples or special tissue glue at each of the incision sites. Once the bandages are removed (if present), the incisions may stay open to air.  You may start showering (but not soaking or bathing in water) the 2nd day after surgery and the incisions simply need to be patted dry after the shower.  No additional care is needed. 5. What to call us about: You should call the office [phone]) if you develop fever > 101 or develop persistent vomiting.   You may resume aspirin, advil, aleve, vitamins, and supplements 7 days after surgery.   You may resume Eliquis beginning on Saturday evening.

## 2019-02-21 NOTE — Progress Notes (Signed)
Patient ID: Kenneth Fischer, male   DOB: 06-Nov-1950, 69 y.o.   MRN: [id number]  1 Day Post-Op Subjective: Doing well.  Pain controlled. Ambulated well.  Objective: Vital signs in last 24 hours: Temp:  [97.5 F (36.4 C)-97.8 F (36.6 C)] 97.7 F (36.5 C) (01/08 0459) Pulse Rate:  [57-84] 59 (01/08 0459) Resp:  [15-25] 20 (01/08 0459) BP: (131-170)/(64-85) 134/69 (01/08 0459) SpO2:  [94 %-100 %] 100 % (01/08 0459) Weight:  [97.8 kg] 97.8 kg (01/07 0919)  Intake/Output from previous day: 01/07 0701 - 01/08 0700 In: 1896.1 [I.V.:1196.1; IV Piggyback:700] Out: 650 [Urine:450; Blood:200] Intake/Output this shift: No intake/output data recorded.  Physical Exam:  General: Alert and oriented CV: RRR Lungs: Clear Abdomen: Soft, ND, positive BS Incisions: C/D/I GU: Urine clear Ext: NT, No erythema  Lab Results: Recent Labs    02/20/19 1536 02/21/19 0412  HGB 15.3 14.7  HCT 48.8 46.4   BMET Recent Labs    02/21/19 0412  NA 137  K 4.5  CL 100  CO2 25  GLUCOSE 126*  BUN 25*  CREATININE 1.77*  CALCIUM 8.7*     Studies/Results: No results found.  Assessment/Plan: POD # 1 s/p right radical nephrectomy - Ambulate - SL IVF - Dulcolax - Path pending - Advance diet - Oral pain meds - Re-evaluate for possible d/c home later today    LOS: 0 days   Dutch Gray 02/21/2019, 7:24 AM

## 2019-02-24 LAB — SURGICAL PATHOLOGY

## 2020-07-02 ENCOUNTER — Telehealth: Payer: Self-pay | Admitting: Oncology

## 2020-07-02 NOTE — Telephone Encounter (Signed)
Received a new pt referral from dr. Alinda Money for History of locally advanced renal cell carcinoma with solitary enlarging pulmonary nodule. [name] has been scheduled to see Dr. Alen Blew on 5/31 at 11am. Appt date and time has been given to the pt's conservator. Aware to arrive 20 minuets early.

## 2020-07-05 ENCOUNTER — Telehealth: Payer: Self-pay | Admitting: Radiation Oncology

## 2020-07-05 NOTE — Telephone Encounter (Signed)
Called pt to schedule appt per 5/20 sch msg. Spoke to pt's sister in law who said they would call back after they met with Dr. Alen Blew to schedule the nut appt.

## 2020-07-13 ENCOUNTER — Other Ambulatory Visit: Payer: Self-pay

## 2020-07-13 ENCOUNTER — Inpatient Hospital Stay: Payer: Medicare Other | Attending: Oncology | Admitting: Oncology

## 2020-07-13 VITALS — BP 143/74 | HR 72 | Temp 97.8°F | Resp 18 | Wt 207.5 lb

## 2020-07-13 DIAGNOSIS — R918 Other nonspecific abnormal finding of lung field: Secondary | ICD-10-CM | POA: Diagnosis not present

## 2020-07-13 DIAGNOSIS — D49512 Neoplasm of unspecified behavior of left kidney: Secondary | ICD-10-CM

## 2020-07-13 NOTE — Progress Notes (Signed)
Reason for the request:    Kidney cancer  HPI: I was asked by Dr. Alinda Money to evaluate Kenneth Fischer for the evaluation of renal cell carcinoma.  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  He had a CT scan and incidentally found to have a 4.7 x 4.5 enhancing solid mass in the upper pole of the left kidney as well as a 13 mm mass in the upper pole of the left kidney.  He did not have any evidence of metastatic disease at that time.  He subsequently underwent left robotic assisted laparoscopic radical nephrectomy on February 20, 2019.  The final pathology showed 2 foci measuring 4.8 and 1.5 cm clear-cell renal cell carcinoma with tumor invasion into the perirenal fat indicating T3a disease.  Histological grade was 2.  No evidence of lymphadenopathy or metastatic disease on imaging.  He remained on active surveillance since that time.  CT scan obtained on Jun 21, 2020 showed a 13 mm anterior left lower lobe lung nodule which was previously 8 mm on previous imaging.  Additional ovoid and branching nodular opacities in the lungs bilaterally which is unchanged.  Previous scan was obtained in February 2022 including CT abdomen and pelvis showed a status post left nephrectomy without any evidence of metastatic disease in the abdomen and pelvis.  Clinically, he reports no major complaints at this time.  He denies any cough, wheezing or hemoptysis.  He denies any shortness of breath or difficulty breathing.  He denies any excessive fatigue, tiredness or decline in his performance status.  He continues to live independently and attends to activities of daily living.  He does not report any headaches, blurry vision, syncope or seizures. Does not report any fevers, chills or sweats.  Does not report any cough, wheezing or hemoptysis.  Does not report any chest pain, palpitation, orthopnea or leg edema.  Does not report any nausea, vomiting or abdominal pain.  Does  not report any constipation or diarrhea.  Does not report any skeletal complaints.    Does not report frequency, urgency or hematuria.  Does not report any skin rashes or lesions. Does not report any heat or cold intolerance.  Does not report any lymphadenopathy or petechiae.  Does not report any anxiety or depression.  Remaining review of systems is negative.    Past Medical History:  Diagnosis Date  . Cancer Kenneth Fischer)    right renal neoplasm  . COPD (chronic obstructive pulmonary disease) ([place])   . History of kidney stones   . Hypertension   . Pneumonia   :  Past Surgical History:  Procedure Laterality Date  . ROBOTIC ASSITED PARTIAL NEPHRECTOMY Left 02/20/2019   Procedure: XI ROBOTIC ASSITED LAPAROSCOPIC ASSISTED RADICAL NEPHRECTOMY;  Surgeon: Kenneth Bring, MD;  Location: [facility];  Service: Urology;  Laterality: Left;  . TONSILLECTOMY    :   Current Outpatient Medications:  .  albuterol (VENTOLIN HFA) 108 (90 Base) MCG/ACT inhaler, Inhale 2 puffs into the lungs every 6 (six) hours as needed for wheezing or shortness of breath. , Disp: , Rfl:  .  carvedilol (COREG) 12.5 MG tablet, Take 12.5 mg by mouth 2 (two) times daily., Disp: , Rfl:  .  furosemide (LASIX) 20 MG tablet, Take 20 mg by mouth daily., Disp: , Rfl:  .  sacubitril-valsartan (ENTRESTO) 49-51 MG, Take 1 tablet by mouth 2 (two) times daily., Disp: , Rfl:  .  traMADol (ULTRAM) 50  MG tablet, Take 1-2 tablets (50-100 mg total) by mouth every 6 (six) hours as needed for moderate pain or severe pain., Disp: 20 tablet, Rfl: 0:  Allergies  Allergen Reactions  . Codeine Hives    Nausea, vomiting, hives   . Morphine And Related Hives    Hives, nausea and vomiting  :  No family history on file.:  Social History   Socioeconomic History  . Marital status: Single    Spouse name: Not on file  . Number of children: Not on file  . Years of education: Not on file  . Highest education level: Not on file  Occupational History  .  Not on file  Tobacco Use  . Smoking status: Former Smoker    Packs/day: 2.00    Years: 10.00    Pack years: 20.00    Types: Cigarettes    Quit date: 11/24/1988    Years since quitting: 31.6  . Smokeless tobacco: Never Used  Vaping Use  . Vaping Use: Never used  Substance and Sexual Activity  . Alcohol use: Not Currently  . Drug use: Never  . Sexual activity: Not on file  Other Topics Concern  . Not on file  Social History Narrative  . Not on file   Social Determinants of Health   Financial Resource Strain: Not on file  Food Insecurity: Not on file  Transportation Needs: Not on file  Physical Activity: Not on file  Stress: Not on file  Social Connections: Not on file  Intimate Partner Violence: Not on file  :  Pertinent items are noted in HPI.  Exam: Blood pressure (!) 143/74, pulse 72, temperature 97.8 F (36.6 C), temperature source Oral, resp. rate 18, weight 207 lb 8 oz (94.1 kg), SpO2 (!) 89 %.  ECOG 1  General appearance: alert and cooperative appeared without distress. Head: atraumatic without any abnormalities. Eyes: conjunctivae/corneas clear. PERRL.  Sclera anicteric. Throat: lips, mucosa, and tongue normal; without oral thrush or ulcers. Resp: clear to auscultation bilaterally without rhonchi, wheezes or dullness to percussion. Cardio: regular rate and rhythm, S1, S2 normal, no murmur, click, rub or gallop GI: soft, non-tender; bowel sounds normal; no masses,  no organomegaly Skin: Skin color, texture, turgor normal. No rashes or lesions Lymph nodes: Cervical, supraclavicular, and axillary nodes normal. Neurologic: Grossly normal without any motor, sensory or deep tendon reflexes. Musculoskeletal: No joint deformity or effusion.    Assessment and Plan:   70 year old man with:  1.  Renal cell carcinoma diagnosed in 2020.  He underwent left nephrectomy in January 2021.  He was found to have multifocal T3a disease with clear-cell histology and nuclear  grade 2.  He recovered well from surgery without any complications.  He is currently on active surveillance and did not receive any additional therapy.  He has no evidence of metastatic disease in the abdomen pelvis based on imaging studies in February 2022.   His disease status was updated at this time and risk of relapse was discussed.  He does have pulmonary nodule which will be addressed later in this note but no evidence of metastatic disease anywhere else.  At this time, I see no role for any additional systemic therapy for adjuvant purposes.  However, if he has a documented stage IV disease that is resected adjuvant Pembrolizumab can be considered at that time.  2.  Left lung nodule measuring 13 mm noted in May 2022 which has grown from 8 mm in February and February 2022.  The  differential diagnosis at this time was discussed.  Primary lung neoplasm versus metastatic disease from kidney cancer versus other etiologies were reviewed.  Management options moving forward include continued watchful observation versus obtaining tissue biopsy versus surgical resection for diagnostic and therapeutic purposes.  At this time, given the growth of this nodule as well as the isolated nature of the in favor of a evaluation with thoracic surgery for possible both therapeutic and diagnostic resection of the pulmonary nodule.  If this felt to be high risk for this patient, obtaining tissue biopsy first and then consideration for radiation therapy could also be considered.  Overall, I am in favor obtaining tissue diagnosis before presumably assume that this is metastatic kidney cancer.   After discussion today with the patient and his family, he is agreeable to proceed with thoracic surgery consultation in the near future.  3.  Follow-up: Will be in the next few months to follow his progress.   60  minutes were dedicated to this visit. The time was spent on reviewing imaging studies, discussing treatment  options, discussing differential diagnosis and answering questions regarding future plan.    A copy of this consult has been forwarded to the requesting physician.

## 2020-08-20 ENCOUNTER — Telehealth: Payer: Self-pay | Admitting: Oncology

## 2020-08-20 NOTE — Telephone Encounter (Signed)
Rescheduled 07/29 phone visit to 07/25 per provider pal, called patient regarding scheduling change.

## 2020-09-02 ENCOUNTER — Telehealth: Payer: Self-pay | Admitting: *Deleted

## 2020-09-02 ENCOUNTER — Other Ambulatory Visit: Payer: Self-pay | Admitting: Oncology

## 2020-09-02 ENCOUNTER — Inpatient Hospital Stay: Payer: Medicare Other | Attending: Oncology | Admitting: Oncology

## 2020-09-02 ENCOUNTER — Encounter: Payer: Self-pay | Admitting: Urology

## 2020-09-02 DIAGNOSIS — R918 Other nonspecific abnormal finding of lung field: Secondary | ICD-10-CM

## 2020-09-02 DIAGNOSIS — D49512 Neoplasm of unspecified behavior of left kidney: Secondary | ICD-10-CM | POA: Diagnosis not present

## 2020-09-02 NOTE — Progress Notes (Signed)
Hematology and Oncology Follow Up for Telemedicine Visits  Kenneth Fischer [id number] [birth year] 70 y.o. 09/02/2020 9:08 AM [name], MDStambaugh, Merris, MD   I connected with Kenneth Fischer on 09/02/20 at  9:30 AM EDT by telephone visit and verified that I am speaking with the correct person using two identifiers.   I discussed the limitations, risks, security and privacy concerns of performing an evaluation and management service by telemedicine and the availability of in-person appointments. I also discussed with the patient that there may be a patient responsible charge related to this service. The patient expressed understanding and agreed to proceed.  Other persons participating in the visit and their role in the encounter:    Patient's location: Home Provider's location: Office    Principle Diagnosis: 70 year old with renal cell carcinoma diagnosed in 2020 after he was found to have T3a clear-cell histology.  He developed pulmonary nodule in May 2022.   Prior Therapy:  He underwent left robotic assisted laparoscopic radical nephrectomy on February 20, 2019.  The final pathology showed 2 foci measuring 4.8 and 1.5 cm clear-cell renal cell carcinoma with tumor invasion into the perirenal fat indicating T3a disease.  Histological grade was 2.  No evidence of lymphadenopathy or metastatic disease on imaging.  Current therapy: Active surveillance and under evaluation for possible additional therapy.  Interim History: Kenneth Fischer reports no major complaints at this time.  He denies any shortness of breath or difficulty breathing.  He denies any recent hospitalizations or illnesses.     Medications: I have reviewed the patient's current medications.  Current Outpatient Medications  Medication Sig Dispense Refill   albuterol (VENTOLIN HFA) 108 (90 Base) MCG/ACT inhaler Inhale 2 puffs into the lungs every 6 (six) hours as needed for wheezing or shortness of breath.      apixaban  (ELIQUIS) 5 MG TABS tablet Take 5 mg by mouth 2 (two) times daily.     carvedilol (COREG) 12.5 MG tablet Take 12.5 mg by mouth 2 (two) times daily.     furosemide (LASIX) 20 MG tablet Take 20 mg by mouth daily.     sacubitril-valsartan (ENTRESTO) 49-51 MG Take 1 tablet by mouth 2 (two) times daily.     traMADol (ULTRAM) 50 MG tablet Take 1-2 tablets (50-100 mg total) by mouth every 6 (six) hours as needed for moderate pain or severe pain. 20 tablet 0   No current facility-administered medications for this visit.     Allergies:  Allergies  Allergen Reactions   Codeine Hives    Nausea, vomiting, hives    Morphine And Related Hives    Hives, nausea and vomiting       Lab Results: Lab Results  Component Value Date   WBC 5.6 02/17/2019   HGB 14.7 02/21/2019   HCT 46.4 02/21/2019   MCV 89.6 02/17/2019   PLT 249 02/17/2019     Chemistry      Component Value Date/Time   NA 137 02/21/2019 0412   K 4.5 02/21/2019 0412   CL 100 02/21/2019 0412   CO2 25 02/21/2019 0412   BUN 25 (H) 02/21/2019 0412   CREATININE 1.77 (H) 02/21/2019 0412      Component Value Date/Time   CALCIUM 8.7 (L) 02/21/2019 0412          Impression and Plan:   70 year old man with:   1.  T3a clear-cell renal cell carcinoma diagnosed in 2020.  He is status post nephrectomy without any clear-cut evidence of metastatic disease outside  of a lung nodule.   2.  Left lung nodule measuring 13 mm noted in May 2022 which has grown from 8 mm in February and February 2022.   The etiology of this lung nodule is related to metastatic kidney cancer versus primary lung neoplasm versus benign etiology.  Treatment choices were discussed at this time including surgical resection for diagnostic and therapeutic purposes versus continued observation versus radiation.  I did refer him to thoracic surgery previously but he has not completed this evaluation yet.  I recommended follow-up with thoracic surgery first prior to  any additional intervention.  A referral has been made and will follow up to make sure it is scheduled appropriately.   3.  Follow-up: will be in the next few months to follow his progress.    I discussed the assessment and treatment plan with the patient. The patient was provided an opportunity to ask questions and all were answered. The patient agreed with the plan and demonstrated an understanding of the instructions.   The patient was advised to call back or seek an in-person evaluation if the symptoms worsen or if the condition fails to improve as anticipated.  I provided 20 minutes of non face-to-face telephone visit time during this encounter.  Time was dedicated to reviewing imaging studies, treatment choices and answering questions regarding future plan of care.  Zola Button, MD 09/02/2020 9:08 AM

## 2020-09-02 NOTE — Telephone Encounter (Signed)
PC to patient, he was not available, informed family member that Dr. Alen Blew has referred the patient to [facility].  They need a PET scan before patient can be seen.  Dr. Alen Blew has ordered a PET scan which will require PA.  Family member instructed to call Central Scheduling next week to schedule PET.  Understanding verbalized.

## 2020-09-02 NOTE — Telephone Encounter (Signed)
Micki Riley, Referral Coordinator at [facility], contacted regarding referral for this patient from 07/13/20.  She states she was unaware of referral, will be contacting patient.

## 2020-09-06 ENCOUNTER — Ambulatory Visit: Payer: Medicare Other | Admitting: Oncology

## 2020-09-07 ENCOUNTER — Telehealth: Payer: Self-pay | Admitting: *Deleted

## 2020-09-07 NOTE — Telephone Encounter (Signed)
Left VM for Micki Riley, Referral Coordinator at [facility].  Informed her this patient has PET scan scheduled for 09/23/20.

## 2020-09-09 ENCOUNTER — Ambulatory Visit: Payer: Medicare Other | Admitting: Oncology

## 2020-09-23 ENCOUNTER — Other Ambulatory Visit: Payer: Self-pay

## 2020-09-23 ENCOUNTER — Ambulatory Visit (HOSPITAL_COMMUNITY)
Admission: RE | Admit: 2020-09-23 | Discharge: 2020-09-23 | Disposition: A | Discharge status: Home / Self Care | Payer: Medicare Other | Source: Ambulatory Visit | Attending: Oncology | Admitting: Oncology

## 2020-09-23 DIAGNOSIS — R918 Other nonspecific abnormal finding of lung field: Secondary | ICD-10-CM | POA: Insufficient documentation

## 2020-09-23 DIAGNOSIS — Z905 Acquired absence of kidney: Secondary | ICD-10-CM | POA: Insufficient documentation

## 2020-09-23 DIAGNOSIS — Z79899 Other long term (current) drug therapy: Secondary | ICD-10-CM | POA: Diagnosis not present

## 2020-09-23 LAB — GLUCOSE, CAPILLARY: Glucose-Capillary: 103 mg/dL — ABNORMAL HIGH (ref 70–99)

## 2020-09-23 MED ORDER — FLUDEOXYGLUCOSE F - 18 (FDG) INJECTION
10.0000 | Freq: Once | INTRAVENOUS | Status: AC
  Administered 2020-09-23: 10.3 via INTRAVENOUS

## 2020-09-24 ENCOUNTER — Encounter: Payer: Medicare Other | Admitting: Thoracic Surgery (Cardiothoracic Vascular Surgery)

## 2020-10-01 ENCOUNTER — Encounter: Payer: Self-pay | Admitting: Thoracic Surgery (Cardiothoracic Vascular Surgery)

## 2020-10-01 ENCOUNTER — Institutional Professional Consult (permissible substitution) (INDEPENDENT_AMBULATORY_CARE_PROVIDER_SITE_OTHER): Payer: Medicare Other | Admitting: Thoracic Surgery (Cardiothoracic Vascular Surgery)

## 2020-10-01 ENCOUNTER — Other Ambulatory Visit: Payer: Self-pay

## 2020-10-01 ENCOUNTER — Other Ambulatory Visit: Payer: Self-pay | Admitting: Thoracic Surgery (Cardiothoracic Vascular Surgery)

## 2020-10-01 VITALS — BP 170/70 | HR 68 | Resp 20 | Ht 72.0 in | Wt 210.0 lb

## 2020-10-01 DIAGNOSIS — R918 Other nonspecific abnormal finding of lung field: Secondary | ICD-10-CM

## 2020-10-01 DIAGNOSIS — Z85528 Personal history of other malignant neoplasm of kidney: Secondary | ICD-10-CM

## 2020-10-01 NOTE — H&P (View-Only) (Signed)
PCP is [name], MD Referring Provider is Wyatt Portela, MD  Chief Complaint  Patient presents with   Lung Mass    Surgical consult, Chest CT 09/02/20, PET Scan 09/24/20,    HPI: Kenneth Fischer is sent for consultation regarding a left lower lobe lung nodule.  Kenneth Fischer is a 70 year old man with a past medical history significant for hypertension, kidney stones, renal cell carcinoma, left nephrectomy, stage III chronic kidney disease, congestive heart failure, pulmonary embolism, tobacco abuse, and COPD.  He smoked about 2 packs of cigarettes daily prior to quitting in 1990.  He had a left nephrectomy by Dr. Alinda Money back in 2021.  He has been followed with scans since then.  He has had some unusual appearing findings in his lungs and has extensive scarring as well as some bronchiectasis and probably mucous plugging bilaterally.  9 months ago there was a new tiny nodule in the anterior basilar left lower lobe.  That nodule has grown over time.  A PET/CT recently showed moderate uptake.  There was no evidence of disease elsewhere.  Findings were suspicious for metastatic renal cell carcinoma.  Kenneth Fischer is accompanied by his brother and wife.  They provided the majority of the history and medical background.  He denies any chest pain, pressure, tightness.  No history of MI.  He does have a history of congestive heart failure and hypertension that family says started around the time he had his pulmonary emboli.  They think that was about 3 years ago.  He is on Eliquis for that.  Zubrod Score: At the time of surgery this patient's most appropriate activity status/level should be described as: '[]'$     0    Normal activity, no symptoms '[x]'$     1    Restricted in physical strenuous activity but ambulatory, able to do out light work '[]'$     2    Ambulatory and capable of self care, unable to do work activities, up and about >50 % of waking hours                              '[]'$     3    Only  limited self care, in bed greater than 50% of waking hours '[]'$     4    Completely disabled, no self care, confined to bed or chair '[]'$     5    Moribund  Past Medical History:  Diagnosis Date   Cancer ([place])    right renal neoplasm   COPD (chronic obstructive pulmonary disease) ([place])    History of kidney stones    Hypertension    Pneumonia     Past Surgical History:  Procedure Laterality Date   ROBOTIC ASSITED PARTIAL NEPHRECTOMY Left 02/20/2019   Procedure: XI ROBOTIC ASSITED LAPAROSCOPIC ASSISTED RADICAL NEPHRECTOMY;  Surgeon: Raynelle Bring, MD;  Location: [facility];  Service: Urology;  Laterality: Left;   TONSILLECTOMY      No family history on file.  Social History Social History   Tobacco Use   Smoking status: Former    Packs/day: 2.00    Years: 10.00    Pack years: 20.00    Types: Cigarettes    Quit date: 11/24/1988    Years since quitting: 31.8   Smokeless tobacco: Never  Vaping Use   Vaping Use: Never used  Substance Use Topics   Alcohol use: Not Currently   Drug use: Never  Current Outpatient Medications  Medication Sig Dispense Refill   albuterol (VENTOLIN HFA) 108 (90 Base) MCG/ACT inhaler Inhale 2 puffs into the lungs every 6 (six) hours as needed for wheezing or shortness of breath.      apixaban (ELIQUIS) 5 MG TABS tablet Take 5 mg by mouth 2 (two) times daily.     carvedilol (COREG) 12.5 MG tablet Take 12.5 mg by mouth 2 (two) times daily.     furosemide (LASIX) 20 MG tablet Take 20 mg by mouth daily.     sacubitril-valsartan (ENTRESTO) 49-51 MG Take 1 tablet by mouth 2 (two) times daily.     traMADol (ULTRAM) 50 MG tablet Take 1-2 tablets (50-100 mg total) by mouth every 6 (six) hours as needed for moderate pain or severe pain. 20 tablet 0   No current facility-administered medications for this visit.    Allergies  Allergen Reactions   Codeine Hives    Nausea, vomiting, hives    Morphine And Related Hives    Hives, nausea and vomiting    Review  of Systems  Constitutional:  Negative for activity change, appetite change and unexpected weight change.  HENT:  Positive for dental problem.   Eyes:  Negative for visual disturbance.  Respiratory:  Positive for shortness of breath and wheezing.   Cardiovascular:  Positive for leg swelling. Negative for chest pain.  Genitourinary:  Negative for difficulty urinating and dysuria.  Neurological:  Negative for syncope and weakness.  Hematological:  Bruises/bleeds easily.  All other systems reviewed and are negative.  BP (!) 170/70   Pulse 68   Resp 20   Ht 6' (1.829 m)   Wt 210 lb (95.3 kg)   SpO2 90% Comment: RA  BMI 28.48 kg/m  Physical Exam Vitals reviewed.  Constitutional:      General: He is not in acute distress.    Appearance: Normal appearance.  HENT:     Head: Normocephalic and atraumatic.  Eyes:     General: No scleral icterus.    Extraocular Movements: Extraocular movements intact.  Cardiovascular:     Rate and Rhythm: Normal rate and regular rhythm.     Heart sounds: Normal heart sounds. No murmur heard.   No gallop.  Pulmonary:     Effort: Pulmonary effort is normal. No respiratory distress.     Breath sounds: No wheezing or rales.     Comments: Diminished breath sounds bilaterally Abdominal:     General: There is no distension.     Palpations: Abdomen is soft.  Musculoskeletal:     Cervical back: Neck supple.  Lymphadenopathy:     Cervical: No cervical adenopathy.  Skin:    General: Skin is warm and dry.  Neurological:     General: No focal deficit present.     Mental Status: He is alert and oriented to person, place, and time.     Cranial Nerves: No cranial nerve deficit.     Motor: No weakness.     Diagnostic Tests: NUCLEAR MEDICINE PET SKULL BASE TO THIGH   TECHNIQUE: 10.3 mCi F-18 FDG was injected intravenously. Full-ring PET imaging was performed from the skull base to thigh after the radiotracer. CT data was obtained and used for attenuation  correction and anatomic localization.   Fasting blood glucose: 103 mg/dl   COMPARISON:  CT 06/21/2020   FINDINGS: Mediastinal blood pool activity: SUV max 2.2   Liver activity: SUV max NA   NECK: No significant misregistration between the PET data set  in the CT data set in the head and neck.   Incidental CT findings: none   CHEST: The nodule of concern in the anterior LEFT lower lobe measures 16 mm (image 93/CT series 4) compared to 13 mm. Additionally, this enlarging nodule has intense metabolic activity with SUV max equal 4.7 (image 92).   There is pleuroparenchymal thickening in the upper lobes not changed from prior without metabolic activity. Parenchymal thickening along dense calcifications in the RIGHT upper lobe without metabolic activity.   No hypermetabolic mediastinal lymph nodes   Incidental CT findings: none   ABDOMEN/PELVIS: Post LEFT nephrectomy. Nodularity in nephrectomy bed. No abnormal metabolic activity liver. Mild thickened adrenal glands is favored benign. No abdominopelvic adenopathy.   Incidental CT findings: none   SKELETON: No focal hypermetabolic activity to suggest skeletal metastasis.   Incidental CT findings: none   IMPRESSION: 1. Enlarging round hypermetabolic nodule in the LEFT lower lobe consistent with neoplasm. Favor metastatic renal cell carcinoma. 2. No additional evidence of metastatic disease in the chest abdomen pelvis. 3. Post LEFT nephrectomy without evidence local recurrence. 4. No evidence skeletal metastasis 5. Additional peribronchial thickening and scarring in the lungs without metabolic activity is favored benign.     Electronically Signed   By: Suzy Bouchard M.D.   On: 09/24/2020 14:49 I personally reviewed his PET/CT images.  There is a left lower lobe nodule with moderate uptake consistent with metastatic renal cell carcinoma.  I also reviewed his CTs through the PACS system.  The nodule in question was a few  millimeters 9 months ago it has grown not dramatically over time.  Impression: Kenneth Fischer is a 70 year old man with a past medical history significant for hypertension, kidney stones, renal cell carcinoma, left nephrectomy, stage III chronic kidney disease, congestive heart failure, pulmonary embolism, tobacco abuse, and COPD.  He smoked about 2 packs of cigarettes daily prior to quitting in 1990.  He had a left nephrectomy for renal cell carcinoma in 2021.  About 9 months ago there was a new left lower lobe nodule that has increased in size over time.  This is in the background of extensive scarring as well as some bronchiectasis and mucous plugging.  The nodule was hypermetabolic by PET.  Findings are most consistent with metastatic renal cell carcinoma.  Not a typical appearance for a garden-variety primary bronchogenic carcinoma.  Could potentially be a carcinoid tumor.  I discussed with the patient and his family the differential diagnosis.  We reviewed the images.  This is not favorable for biopsy due to its location directly adjacent to the heart.  Also a negative biopsy would not rule out the possibility of metastatic renal cell carcinoma.  I recommended that we proceed with robotic assisted left VATS for wedge resection or possibly partial basilar segmentectomy depending on intraoperative findings.  That would provide definitive diagnosis and treatment.  I informed them of the general nature of the procedure including the need for general anesthesia, the incisions to be used, the use of drains to postoperatively, the expected hospital stay, and the overall recovery.  I informed them of the indications, risks, benefits, and alternatives.  They understand the risk include, but not limited to death, MI, DVT, PE, bleeding, possible need for transfusion, infection, prolonged air leak, cardiac arrhythmias, respiratory or renal failure, as well as the possibility of other unforeseeable complications.  He  is a high risk patient given his history of pulmonary emboli.  He would need to hold Eliquis for  48 hours prior to procedure and likely will be off it for several days after the procedure as well.  He would be covered with sequential compression devices and low-dose Lovenox during that time.  They understand that surgical resection is an acceptable treatment for oligometastatic renal cell carcinoma.  There is no guarantee of a cure with surgery or any other treatment.  He would need pulmonary function testing with and without bronchodilators prior to having a surgical resection.  They did have questions regarding alternative forms of treatment like radiation and chemotherapy.  I referred them back to Dr. Alen Blew to answer those questions.  Plan: Pulmonary function testing with and without bronchodilators Will need to hold Eliquis for 48 hours prior to procedure Left VATS for left lower lobe wedge resection versus basilar segmentectomy for metastatic renal cell carcinoma.  We will call patient to schedule.  [name], MD [facility] [phone]

## 2020-10-01 NOTE — Progress Notes (Signed)
PCP is [name], MD Referring Provider is Wyatt Portela, MD  Chief Complaint  Patient presents with   Lung Mass    Surgical consult, Chest CT 09/02/20, PET Scan 09/24/20,    HPI: Kenneth Fischer is sent for consultation regarding a left lower lobe lung nodule.  Kenneth Fischer is a 70 year old man with a past medical history significant for hypertension, kidney stones, renal cell carcinoma, left nephrectomy, stage III chronic kidney disease, congestive heart failure, pulmonary embolism, tobacco abuse, and COPD.  He smoked about 2 packs of cigarettes daily prior to quitting in 1990.  He had a left nephrectomy by Dr. Alinda Money back in 2021.  He has been followed with scans since then.  He has had some unusual appearing findings in his lungs and has extensive scarring as well as some bronchiectasis and probably mucous plugging bilaterally.  9 months ago there was a new tiny nodule in the anterior basilar left lower lobe.  That nodule has grown over time.  A PET/CT recently showed moderate uptake.  There was no evidence of disease elsewhere.  Findings were suspicious for metastatic renal cell carcinoma.  Kenneth Fischer is accompanied by his brother and wife.  They provided the majority of the history and medical background.  He denies any chest pain, pressure, tightness.  No history of MI.  He does have a history of congestive heart failure and hypertension that family says started around the time he had his pulmonary emboli.  They think that was about 3 years ago.  He is on Eliquis for that.  Zubrod Score: At the time of surgery this patient's most appropriate activity status/level should be described as: '[]'$     0    Normal activity, no symptoms '[x]'$     1    Restricted in physical strenuous activity but ambulatory, able to do out light work '[]'$     2    Ambulatory and capable of self care, unable to do work activities, up and about >50 % of waking hours                              '[]'$     3    Only  limited self care, in bed greater than 50% of waking hours '[]'$     4    Completely disabled, no self care, confined to bed or chair '[]'$     5    Moribund  Past Medical History:  Diagnosis Date   Cancer ([place])    right renal neoplasm   COPD (chronic obstructive pulmonary disease) ([place])    History of kidney stones    Hypertension    Pneumonia     Past Surgical History:  Procedure Laterality Date   ROBOTIC ASSITED PARTIAL NEPHRECTOMY Left 02/20/2019   Procedure: XI ROBOTIC ASSITED LAPAROSCOPIC ASSISTED RADICAL NEPHRECTOMY;  Surgeon: Raynelle Bring, MD;  Location: [facility];  Service: Urology;  Laterality: Left;   TONSILLECTOMY      No family history on file.  Social History Social History   Tobacco Use   Smoking status: Former    Packs/day: 2.00    Years: 10.00    Pack years: 20.00    Types: Cigarettes    Quit date: 11/24/1988    Years since quitting: 31.8   Smokeless tobacco: Never  Vaping Use   Vaping Use: Never used  Substance Use Topics   Alcohol use: Not Currently   Drug use: Never  Current Outpatient Medications  Medication Sig Dispense Refill   albuterol (VENTOLIN HFA) 108 (90 Base) MCG/ACT inhaler Inhale 2 puffs into the lungs every 6 (six) hours as needed for wheezing or shortness of breath.      apixaban (ELIQUIS) 5 MG TABS tablet Take 5 mg by mouth 2 (two) times daily.     carvedilol (COREG) 12.5 MG tablet Take 12.5 mg by mouth 2 (two) times daily.     furosemide (LASIX) 20 MG tablet Take 20 mg by mouth daily.     sacubitril-valsartan (ENTRESTO) 49-51 MG Take 1 tablet by mouth 2 (two) times daily.     traMADol (ULTRAM) 50 MG tablet Take 1-2 tablets (50-100 mg total) by mouth every 6 (six) hours as needed for moderate pain or severe pain. 20 tablet 0   No current facility-administered medications for this visit.    Allergies  Allergen Reactions   Codeine Hives    Nausea, vomiting, hives    Morphine And Related Hives    Hives, nausea and vomiting    Review  of Systems  Constitutional:  Negative for activity change, appetite change and unexpected weight change.  HENT:  Positive for dental problem.   Eyes:  Negative for visual disturbance.  Respiratory:  Positive for shortness of breath and wheezing.   Cardiovascular:  Positive for leg swelling. Negative for chest pain.  Genitourinary:  Negative for difficulty urinating and dysuria.  Neurological:  Negative for syncope and weakness.  Hematological:  Bruises/bleeds easily.  All other systems reviewed and are negative.  BP (!) 170/70   Pulse 68   Resp 20   Ht 6' (1.829 m)   Wt 210 lb (95.3 kg)   SpO2 90% Comment: RA  BMI 28.48 kg/m  Physical Exam Vitals reviewed.  Constitutional:      General: He is not in acute distress.    Appearance: Normal appearance.  HENT:     Head: Normocephalic and atraumatic.  Eyes:     General: No scleral icterus.    Extraocular Movements: Extraocular movements intact.  Cardiovascular:     Rate and Rhythm: Normal rate and regular rhythm.     Heart sounds: Normal heart sounds. No murmur heard.   No gallop.  Pulmonary:     Effort: Pulmonary effort is normal. No respiratory distress.     Breath sounds: No wheezing or rales.     Comments: Diminished breath sounds bilaterally Abdominal:     General: There is no distension.     Palpations: Abdomen is soft.  Musculoskeletal:     Cervical back: Neck supple.  Lymphadenopathy:     Cervical: No cervical adenopathy.  Skin:    General: Skin is warm and dry.  Neurological:     General: No focal deficit present.     Mental Status: He is alert and oriented to person, place, and time.     Cranial Nerves: No cranial nerve deficit.     Motor: No weakness.     Diagnostic Tests: NUCLEAR MEDICINE PET SKULL BASE TO THIGH   TECHNIQUE: 10.3 mCi F-18 FDG was injected intravenously. Full-ring PET imaging was performed from the skull base to thigh after the radiotracer. CT data was obtained and used for attenuation  correction and anatomic localization.   Fasting blood glucose: 103 mg/dl   COMPARISON:  CT 06/21/2020   FINDINGS: Mediastinal blood pool activity: SUV max 2.2   Liver activity: SUV max NA   NECK: No significant misregistration between the PET data set  in the CT data set in the head and neck.   Incidental CT findings: none   CHEST: The nodule of concern in the anterior LEFT lower lobe measures 16 mm (image 93/CT series 4) compared to 13 mm. Additionally, this enlarging nodule has intense metabolic activity with SUV max equal 4.7 (image 92).   There is pleuroparenchymal thickening in the upper lobes not changed from prior without metabolic activity. Parenchymal thickening along dense calcifications in the RIGHT upper lobe without metabolic activity.   No hypermetabolic mediastinal lymph nodes   Incidental CT findings: none   ABDOMEN/PELVIS: Post LEFT nephrectomy. Nodularity in nephrectomy bed. No abnormal metabolic activity liver. Mild thickened adrenal glands is favored benign. No abdominopelvic adenopathy.   Incidental CT findings: none   SKELETON: No focal hypermetabolic activity to suggest skeletal metastasis.   Incidental CT findings: none   IMPRESSION: 1. Enlarging round hypermetabolic nodule in the LEFT lower lobe consistent with neoplasm. Favor metastatic renal cell carcinoma. 2. No additional evidence of metastatic disease in the chest abdomen pelvis. 3. Post LEFT nephrectomy without evidence local recurrence. 4. No evidence skeletal metastasis 5. Additional peribronchial thickening and scarring in the lungs without metabolic activity is favored benign.     Electronically Signed   By: Suzy Bouchard M.D.   On: 09/24/2020 14:49 I personally reviewed his PET/CT images.  There is a left lower lobe nodule with moderate uptake consistent with metastatic renal cell carcinoma.  I also reviewed his CTs through the PACS system.  The nodule in question was a few  millimeters 9 months ago it has grown not dramatically over time.  Impression: Kenneth Fischer is a 70 year old man with a past medical history significant for hypertension, kidney stones, renal cell carcinoma, left nephrectomy, stage III chronic kidney disease, congestive heart failure, pulmonary embolism, tobacco abuse, and COPD.  He smoked about 2 packs of cigarettes daily prior to quitting in 1990.  He had a left nephrectomy for renal cell carcinoma in 2021.  About 9 months ago there was a new left lower lobe nodule that has increased in size over time.  This is in the background of extensive scarring as well as some bronchiectasis and mucous plugging.  The nodule was hypermetabolic by PET.  Findings are most consistent with metastatic renal cell carcinoma.  Not a typical appearance for a garden-variety primary bronchogenic carcinoma.  Could potentially be a carcinoid tumor.  I discussed with the patient and his family the differential diagnosis.  We reviewed the images.  This is not favorable for biopsy due to its location directly adjacent to the heart.  Also a negative biopsy would not rule out the possibility of metastatic renal cell carcinoma.  I recommended that we proceed with robotic assisted left VATS for wedge resection or possibly partial basilar segmentectomy depending on intraoperative findings.  That would provide definitive diagnosis and treatment.  I informed them of the general nature of the procedure including the need for general anesthesia, the incisions to be used, the use of drains to postoperatively, the expected hospital stay, and the overall recovery.  I informed them of the indications, risks, benefits, and alternatives.  They understand the risk include, but not limited to death, MI, DVT, PE, bleeding, possible need for transfusion, infection, prolonged air leak, cardiac arrhythmias, respiratory or renal failure, as well as the possibility of other unforeseeable complications.  He  is a high risk patient given his history of pulmonary emboli.  He would need to hold Eliquis for  48 hours prior to procedure and likely will be off it for several days after the procedure as well.  He would be covered with sequential compression devices and low-dose Lovenox during that time.  They understand that surgical resection is an acceptable treatment for oligometastatic renal cell carcinoma.  There is no guarantee of a cure with surgery or any other treatment.  He would need pulmonary function testing with and without bronchodilators prior to having a surgical resection.  They did have questions regarding alternative forms of treatment like radiation and chemotherapy.  I referred them back to Dr. Alen Blew to answer those questions.  Plan: Pulmonary function testing with and without bronchodilators Will need to hold Eliquis for 48 hours prior to procedure Left VATS for left lower lobe wedge resection versus basilar segmentectomy for metastatic renal cell carcinoma.  We will call patient to schedule.  [name], MD [facility] [phone]

## 2020-10-04 ENCOUNTER — Other Ambulatory Visit: Payer: Self-pay | Admitting: *Deleted

## 2020-10-04 ENCOUNTER — Encounter: Payer: Self-pay | Admitting: *Deleted

## 2020-10-04 DIAGNOSIS — R911 Solitary pulmonary nodule: Secondary | ICD-10-CM

## 2020-10-04 DIAGNOSIS — I509 Heart failure, unspecified: Secondary | ICD-10-CM

## 2020-10-04 DIAGNOSIS — Z85528 Personal history of other malignant neoplasm of kidney: Secondary | ICD-10-CM

## 2020-10-04 DIAGNOSIS — R918 Other nonspecific abnormal finding of lung field: Secondary | ICD-10-CM

## 2020-10-12 NOTE — Progress Notes (Signed)
Surgical Instructions    Your procedure is scheduled on Friday, September 2nd.  Report to [facility] Main Entrance "A" at 5:30 A.M., then check in with the Admitting office.  Call this number if you have problems the morning of surgery:  [phone]   If you have any questions prior to your surgery date call [phone]: Open Monday-Friday 8am-4pm    Remember:  Do not eat or drink after midnight the night before your surgery     Take these medicines the morning of surgery with A SIP OF WATER  Carvedilol (Coreg)  If needed: Albuterol inhaler - bring with you on the day of surgery  Follow your surgeon's instructions on when to stop Eliquis.  If no instructions were given by your surgeon then you will need to call the office to get those instructions.    As of today, STOP taking any Aspirin (unless otherwise instructed by your surgeon) Aleve, Naproxen, Ibuprofen, Motrin, Advil, Goody's, BC's, all herbal medications, fish oil, and all vitamins.          Do not wear jewelry  Do not wear lotions, powders, colognes, or deodorant. Men may shave face and neck. Do not bring valuables to the hospital.             [facility] is not responsible for any belongings or valuables.  Do NOT Smoke (Tobacco/Vaping) or drink Alcohol 24 hours prior to your procedure If you use a CPAP at night, you may bring all equipment for your overnight stay.   Contacts, glasses, dentures or bridgework may not be worn into surgery, please bring cases for these belongings   For patients admitted to the hospital, discharge time will be determined by your treatment team.   Patients discharged the day of surgery will not be allowed to drive home, and someone needs to stay with them for 24 hours.  ONLY 1 SUPPORT PERSON MAY BE PRESENT WHILE YOU ARE IN SURGERY. IF YOU ARE TO BE ADMITTED ONCE YOU ARE IN YOUR ROOM YOU WILL BE ALLOWED TWO (2) VISITORS.  Minor children may have two parents present. Special consideration  for safety and communication needs will be reviewed on a case by case basis.  Special instructions:    Oral Hygiene is also important to reduce your risk of infection.  Remember - BRUSH YOUR TEETH THE MORNING OF SURGERY WITH YOUR REGULAR TOOTHPASTE   [place]- Preparing For Surgery  Before surgery, you can play an important role. Because skin is not sterile, your skin needs to be as free of germs as possible. You can reduce the number of germs on your skin by washing with CHG (chlorahexidine gluconate) Soap before surgery.  CHG is an antiseptic cleaner which kills germs and bonds with the skin to continue killing germs even after washing.     Please do not use if you have an allergy to CHG or antibacterial soaps. If your skin becomes reddened/irritated stop using the CHG.  Do not shave (including legs and underarms) for at least 48 hours prior to first CHG shower. It is OK to shave your face.  Please follow these instructions carefully.     Shower the NIGHT BEFORE SURGERY and the MORNING OF SURGERY with CHG Soap.   If you chose to wash your hair, wash your hair first as usual with your normal shampoo. After you shampoo, rinse your hair and body thoroughly to remove the shampoo.  Then [employer] and genitals (private parts) with your normal  soap and rinse thoroughly to remove soap.  After that Use CHG Soap as you would any other liquid soap. You can apply CHG directly to the skin and wash gently with a scrungie or a clean washcloth.   Apply the CHG Soap to your body ONLY FROM THE NECK DOWN.  Do not use on open wounds or open sores. Avoid contact with your eyes, ears, mouth and genitals (private parts). Wash Face and genitals (private parts)  with your normal soap.   Wash thoroughly, paying special attention to the area where your surgery will be performed.  Thoroughly rinse your body with warm water from the neck down.  DO NOT shower/wash with your normal soap after using and rinsing  off the CHG Soap.  Pat yourself dry with a CLEAN TOWEL.  Wear CLEAN PAJAMAS to bed the night before surgery  Place CLEAN SHEETS on your bed the night before your surgery  DO NOT SLEEP WITH PETS.   Day of Surgery:  Take a shower with CHG soap. Wear Clean/Comfortable clothing the morning of surgery Do not apply any deodorants/lotions.   Remember to brush your teeth WITH YOUR REGULAR TOOTHPASTE.   Please read over the following fact sheets that you were given.

## 2020-10-13 ENCOUNTER — Other Ambulatory Visit: Payer: Self-pay

## 2020-10-13 ENCOUNTER — Encounter (HOSPITAL_COMMUNITY)
Admission: RE | Admit: 2020-10-13 | Discharge: 2020-10-13 | Disposition: A | Discharge status: Home / Self Care | Payer: Medicare Other | Source: Ambulatory Visit | Attending: Thoracic Surgery (Cardiothoracic Vascular Surgery) | Admitting: Thoracic Surgery (Cardiothoracic Vascular Surgery)

## 2020-10-13 ENCOUNTER — Encounter (HOSPITAL_COMMUNITY): Payer: Self-pay

## 2020-10-13 DIAGNOSIS — R918 Other nonspecific abnormal finding of lung field: Secondary | ICD-10-CM

## 2020-10-13 DIAGNOSIS — R911 Solitary pulmonary nodule: Secondary | ICD-10-CM | POA: Insufficient documentation

## 2020-10-13 DIAGNOSIS — I509 Heart failure, unspecified: Secondary | ICD-10-CM | POA: Insufficient documentation

## 2020-10-13 DIAGNOSIS — I491 Atrial premature depolarization: Secondary | ICD-10-CM | POA: Insufficient documentation

## 2020-10-13 DIAGNOSIS — Z85528 Personal history of other malignant neoplasm of kidney: Secondary | ICD-10-CM

## 2020-10-13 DIAGNOSIS — Z20822 Contact with and (suspected) exposure to covid-19: Secondary | ICD-10-CM | POA: Insufficient documentation

## 2020-10-13 DIAGNOSIS — Z01818 Encounter for other preprocedural examination: Secondary | ICD-10-CM

## 2020-10-13 HISTORY — DX: Bronchitis, not specified as acute or chronic: J40

## 2020-10-13 HISTORY — DX: Deep phlebothrombosis in pregnancy, unspecified trimester: O22.30

## 2020-10-13 LAB — CBC
HCT: 46.9 % (ref 39.0–52.0)
Hemoglobin: 14.8 g/dL (ref 13.0–17.0)
MCH: 26.8 pg (ref 26.0–34.0)
MCHC: 31.6 g/dL (ref 30.0–36.0)
MCV: 84.8 fL (ref 80.0–100.0)
Platelets: 260 10*3/uL (ref 150–400)
RBC: 5.53 MIL/uL (ref 4.22–5.81)
RDW: 15.3 % (ref 11.5–15.5)
WBC: 7.1 10*3/uL (ref 4.0–10.5)
nRBC: 0 % (ref 0.0–0.2)

## 2020-10-13 LAB — COMPREHENSIVE METABOLIC PANEL
ALT: 17 U/L (ref 0–44)
AST: 21 U/L (ref 15–41)
Albumin: 3.2 g/dL — ABNORMAL LOW (ref 3.5–5.0)
Alkaline Phosphatase: 136 U/L — ABNORMAL HIGH (ref 38–126)
Anion gap: 10 (ref 5–15)
BUN: 13 mg/dL (ref 8–23)
CO2: 27 mmol/L (ref 22–32)
Calcium: 9.1 mg/dL (ref 8.9–10.3)
Chloride: 100 mmol/L (ref 98–111)
Creatinine, Ser: 0.98 mg/dL (ref 0.61–1.24)
GFR, Estimated: >60 mL/min (ref >60)
Glucose, Bld: 104 mg/dL — ABNORMAL HIGH (ref 70–99)
Potassium: 4.4 mmol/L (ref 3.5–5.1)
Sodium: 137 mmol/L (ref 135–145)
Total Bilirubin: 0.8 mg/dL (ref 0.3–1.2)
Total Protein: 7.1 g/dL (ref 6.5–8.1)

## 2020-10-13 LAB — SURGICAL PCR SCREEN
MRSA, PCR: NEGATIVE
Staphylococcus aureus: NEGATIVE

## 2020-10-13 LAB — BLOOD GAS, ARTERIAL
Acid-Base Excess: 4.6 mmol/L — ABNORMAL HIGH (ref 0.0–2.0)
Bicarbonate: 29.7 mmol/L — ABNORMAL HIGH (ref 20.0–28.0)
Drawn by: 58793
FIO2: 21
O2 Saturation: 94.5 %
Patient temperature: 37
pCO2 arterial: 52.7 mmHg — ABNORMAL HIGH (ref 32.0–48.0)
pH, Arterial: 7.369 (ref 7.350–7.450)
pO2, Arterial: 72.1 mmHg — ABNORMAL LOW (ref 83.0–108.0)

## 2020-10-13 LAB — TYPE AND SCREEN
ABO/RH(D): B POS
Antibody Screen: NEGATIVE

## 2020-10-13 LAB — PROTIME-INR
INR: 1.2 (ref 0.8–1.2)
Prothrombin Time: 14.8 seconds (ref 11.4–15.2)

## 2020-10-13 LAB — APTT: aPTT: 34 seconds (ref 24–36)

## 2020-10-13 LAB — SARS CORONAVIRUS 2 (TAT 6-24 HRS): SARS Coronavirus 2: NEGATIVE

## 2020-10-13 NOTE — Progress Notes (Signed)
PCP - [name], MD Cardiologist - Marcille Blanco, MD  PPM/ICD - Denies  Chest x-ray - 10/13/20 EKG - 10/13/20 Stress Test - Per pt's sister-in-law Marus Woolley, pt had one ~  years ago; results were normal ECHO - 01/16/19 Cardiac Cath - Denies  Sleep Study - No   DM- Denies  Blood Thinner Instructions: Per Dr. Roxan Hockey, stop Eliquis 48 hours prior to sx. Per pt's sister-in-law, last dose 10/12/20. Aspirin Instructions: N/A  ERAS Protcol - Not ordered PRE-SURGERY Ensure or G2- N/A  COVID TEST- 10/13/20; Results pending   Anesthesia review: Yes, cardiac hx  Patient denies shortness of breath, fever, cough and chest pain at PAT appointment   All instructions explained to the patient, with a verbal understanding of the material. Patient agrees to go over the instructions while at home for a better understanding. The opportunity to ask questions was provided.

## 2020-10-13 NOTE — Progress Notes (Signed)
Surgical Instructions    Your procedure is scheduled on Friday, September 2nd.  Report to [facility] Main Entrance "A" at 5:30 A.M., then check in with the Admitting office.  Call this number if you have problems the morning of surgery:  [phone]   If you have any questions prior to your surgery date call [phone]: Open Monday-Friday 8am-4pm    Remember:  Do not eat or drink after midnight the night before your surgery     Take these medicines the morning of surgery with A SIP OF WATER  Carvedilol (Coreg)  If needed: Albuterol inhaler - bring with you on the day of surgery  Per your surgeon's instructions, Stop apixaban (ELIQUIS) 48 hours prior to surgery. Your last dose should be Tuesday, August 30th  As of today, STOP taking any Aspirin (unless otherwise instructed by your surgeon) Aleve, Naproxen, Ibuprofen, Motrin, Advil, Goody's, BC's, all herbal medications, fish oil, and all vitamins.          Do not wear jewelry  Do not wear lotions, powders, colognes, or deodorant. Men may shave face and neck. Do not bring valuables to the hospital.             [facility] is not responsible for any belongings or valuables.  Do NOT Smoke (Tobacco/Vaping) or drink Alcohol 24 hours prior to your procedure If you use a CPAP at night, you may bring all equipment for your overnight stay.   Contacts, glasses, dentures or bridgework may not be worn into surgery, please bring cases for these belongings   For patients admitted to the hospital, discharge time will be determined by your treatment team.   Patients discharged the day of surgery will not be allowed to drive home, and someone needs to stay with them for 24 hours.  ONLY 1 SUPPORT PERSON MAY BE PRESENT WHILE YOU ARE IN SURGERY. IF YOU ARE TO BE ADMITTED ONCE YOU ARE IN YOUR ROOM YOU WILL BE ALLOWED TWO (2) VISITORS.  Minor children may have two parents present. Special consideration for safety and communication needs will be  reviewed on a case by case basis.  Special instructions:    Oral Hygiene is also important to reduce your risk of infection.  Remember - BRUSH YOUR TEETH THE MORNING OF SURGERY WITH YOUR REGULAR TOOTHPASTE   [place]- Preparing For Surgery  Before surgery, you can play an important role. Because skin is not sterile, your skin needs to be as free of germs as possible. You can reduce the number of germs on your skin by washing with CHG (chlorahexidine gluconate) Soap before surgery.  CHG is an antiseptic cleaner which kills germs and bonds with the skin to continue killing germs even after washing.     Please do not use if you have an allergy to CHG or antibacterial soaps. If your skin becomes reddened/irritated stop using the CHG.  Do not shave (including legs and underarms) for at least 48 hours prior to first CHG shower. It is OK to shave your face.  Please follow these instructions carefully.     Shower the NIGHT BEFORE SURGERY and the MORNING OF SURGERY with CHG Soap.   If you chose to wash your hair, wash your hair first as usual with your normal shampoo. After you shampoo, rinse your hair and body thoroughly to remove the shampoo.  Then [employer] and genitals (private parts) with your normal soap and rinse thoroughly to remove soap.  After that Use CHG  Soap as you would any other liquid soap. You can apply CHG directly to the skin and wash gently with a scrungie or a clean washcloth.   Apply the CHG Soap to your body ONLY FROM THE NECK DOWN.  Do not use on open wounds or open sores. Avoid contact with your eyes, ears, mouth and genitals (private parts). Wash Face and genitals (private parts)  with your normal soap.   Wash thoroughly, paying special attention to the area where your surgery will be performed.  Thoroughly rinse your body with warm water from the neck down.  DO NOT shower/wash with your normal soap after using and rinsing off the CHG Soap.  Pat yourself dry with a  CLEAN TOWEL.  Wear CLEAN PAJAMAS to bed the night before surgery  Place CLEAN SHEETS on your bed the night before your surgery  DO NOT SLEEP WITH PETS.   Day of Surgery:  Take a shower with CHG soap. Wear Clean/Comfortable clothing the morning of surgery Do not apply any deodorants/lotions.   Remember to brush your teeth WITH YOUR REGULAR TOOTHPASTE.   Please read over the following fact sheets that you were given.

## 2020-10-14 ENCOUNTER — Ambulatory Visit (HOSPITAL_COMMUNITY)
Admission: RE | Admit: 2020-10-14 | Discharge: 2020-10-14 | Disposition: A | Discharge status: Home / Self Care | Payer: Medicare Other | Source: Ambulatory Visit | Attending: Thoracic Surgery (Cardiothoracic Vascular Surgery) | Admitting: Thoracic Surgery (Cardiothoracic Vascular Surgery)

## 2020-10-14 ENCOUNTER — Other Ambulatory Visit (HOSPITAL_COMMUNITY): Payer: Medicare Other

## 2020-10-14 DIAGNOSIS — R918 Other nonspecific abnormal finding of lung field: Secondary | ICD-10-CM | POA: Insufficient documentation

## 2020-10-14 LAB — PULMONARY FUNCTION TEST
DL/VA % pred: 104 %
DL/VA: 4.2 ml/min/mmHg/L
DLCO cor % pred: 58 %
DLCO cor: 16.05 ml/min/mmHg
DLCO unc % pred: 58 %
DLCO unc: 16.14 ml/min/mmHg
FEF 25-75 Post: 0.51 L/sec
FEF 25-75 Pre: 0.35 L/sec
FEF2575-%Change-Post: 48 %
FEF2575-%Pred-Post: 19 %
FEF2575-%Pred-Pre: 12 %
FEV1-%Change-Post: 24 %
FEV1-%Pred-Post: 31 %
FEV1-%Pred-Pre: 25 %
FEV1-Post: 1.1 L
FEV1-Pre: 0.89 L
FEV1FVC-%Change-Post: 9 %
FEV1FVC-%Pred-Pre: 54 %
FEV6-%Change-Post: 13 %
FEV6-%Pred-Post: 49 %
FEV6-%Pred-Pre: 43 %
FEV6-Post: 2.25 L
FEV6-Pre: 1.98 L
FEV6FVC-%Change-Post: 0 %
FEV6FVC-%Pred-Post: 94 %
FEV6FVC-%Pred-Pre: 94 %
FVC-%Change-Post: 13 %
FVC-%Pred-Post: 52 %
FVC-%Pred-Pre: 46 %
FVC-Post: 2.51 L
FVC-Pre: 2.22 L
Post FEV1/FVC ratio: 44 %
Post FEV6/FVC ratio: 90 %
Pre FEV1/FVC ratio: 40 %
Pre FEV6/FVC Ratio: 89 %
RV % pred: 180 %
RV: 4.61 L
TLC % pred: 93 %
TLC: 6.93 L

## 2020-10-14 MED ORDER — ALBUTEROL SULFATE (2.5 MG/3ML) 0.083% IN NEBU
2.5000 mg | INHALATION_SOLUTION | Freq: Once | RESPIRATORY_TRACT | Status: AC
  Administered 2020-10-14: 2.5 mg via RESPIRATORY_TRACT

## 2020-10-14 NOTE — Progress Notes (Signed)
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  Follows with cardiologist Dr. Dianah Field at [facility] clinic for hx of NICM, HTN, DVT/PE on eliquis, Systolic HF with normalization of EF by echo 2020. Last seen 05/24/20, stable at that time, no changes to management.  Pt reported LD Eliquis 10/12/20.  History of COPD, maintained on as needed albuterol only.   Preop labs reviewed, unremarkable.   EKG 10/13/20: Sinus rhythm with Premature atrial complexes in a pattern of bigeminy. Rate 72.  TTE 01/16/19 (care everywhere): Summary   1. Overall left ventricular ejection fraction is estimated at 55 to 60%.   2. Normal global left ventricular systolic function.   3. (Grade 1) Mildly abnormal left ventricular diastolic filling.   4. Mild concentric left ventricular hypertrophy.   5. There is no evidence of pericardial effusion.   6. Mild mitral annular calcification.   7. No intracardiac thrombi, mass or vegetations.   Kenneth Fischer, Kenneth Fischer [facility]/Anesthesiology Phone [phone] 10/14/2020 9:19 AM

## 2020-10-14 NOTE — Anesthesia Preprocedure Evaluation (Addendum)
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 on medications and Pt. on home beta blockers Normal cardiovascular exam  TTE 01/16/19 (care everywhere): EF 55-60%, grade 1 DD, mild LVH, valves ok   Neuro/Psych negative neurological ROS  negative psych ROS   GI/Hepatic negative GI ROS, Neg liver ROS,   Endo/Other  negative endocrine ROS  Renal/GU Renal InsufficiencyRenal disease (renal ca s/p left nephrectomy)  negative genitourinary   Musculoskeletal negative musculoskeletal ROS (+)   Abdominal   Peds  Hematology negative hematology ROS (+)   Anesthesia Other Findings Day of surgery medications reviewed with patient.  Reproductive/Obstetrics negative OB ROS                           Anesthesia Physical Anesthesia Plan  ASA: 3  Anesthesia Plan: General   Post-op Pain Management:    Induction: Intravenous  PONV Risk Score and Plan: 3 and Treatment may vary due to age or medical condition, Ondansetron and Dexamethasone  Airway Management Planned: Double Lumen EBT  Additional Equipment: Arterial line  Intra-op Plan:   Post-operative Plan: Extubation in OR  Informed Consent: I have reviewed the patients History and Physical, chart, labs and discussed the procedure including the risks, benefits and  alternatives for the proposed anesthesia with the patient or authorized representative who has indicated his/her understanding and acceptance.     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  Follows with cardiologist Dr. Dianah Field at [facility] clinic for hx of NICM, HTN, DVT/PE on eliquis, Systolic HF with normalization of EF by echo 2020. Last seen 05/24/20, stable at that time, no changes to management.  Pt reported LD Eliquis 10/12/20.  History of COPD, maintained on as needed albuterol only.   Preop labs reviewed, unremarkable.   EKG 10/13/20: Sinus rhythm with Premature atrial complexes in a pattern of bigeminy. Rate 72.  )     Anesthesia Quick Evaluation

## 2020-10-15 ENCOUNTER — Inpatient Hospital Stay (HOSPITAL_COMMUNITY): Payer: Medicare Other | Admitting: Certified Registered Nurse Anesthetist

## 2020-10-15 ENCOUNTER — Inpatient Hospital Stay (HOSPITAL_COMMUNITY): Payer: Medicare Other

## 2020-10-15 ENCOUNTER — Inpatient Hospital Stay (HOSPITAL_COMMUNITY): Payer: Medicare Other | Admitting: Physician Assistant

## 2020-10-15 ENCOUNTER — Encounter (HOSPITAL_COMMUNITY): Payer: Self-pay | Admitting: Thoracic Surgery (Cardiothoracic Vascular Surgery)

## 2020-10-15 ENCOUNTER — Encounter (HOSPITAL_COMMUNITY)
Admission: RE | Disposition: A | Discharge status: Home Health | Payer: Self-pay | Source: Home / Self Care | Attending: Thoracic Surgery (Cardiothoracic Vascular Surgery)

## 2020-10-15 ENCOUNTER — Other Ambulatory Visit: Payer: Self-pay

## 2020-10-15 ENCOUNTER — Inpatient Hospital Stay (HOSPITAL_COMMUNITY)
Admission: RE | Admit: 2020-10-15 | Discharge: 2020-10-26 | DRG: 164 | Disposition: A | Discharge status: Home Health | Payer: Medicare Other | Attending: Thoracic Surgery (Cardiothoracic Vascular Surgery) | Admitting: Thoracic Surgery (Cardiothoracic Vascular Surgery)

## 2020-10-15 DIAGNOSIS — N183 Chronic kidney disease, stage 3 unspecified: Secondary | ICD-10-CM | POA: Diagnosis not present

## 2020-10-15 DIAGNOSIS — Z79899 Other long term (current) drug therapy: Secondary | ICD-10-CM

## 2020-10-15 DIAGNOSIS — Z85528 Personal history of other malignant neoplasm of kidney: Secondary | ICD-10-CM

## 2020-10-15 DIAGNOSIS — Z86711 Personal history of pulmonary embolism: Secondary | ICD-10-CM

## 2020-10-15 DIAGNOSIS — Z905 Acquired absence of kidney: Secondary | ICD-10-CM

## 2020-10-15 DIAGNOSIS — J939 Pneumothorax, unspecified: Secondary | ICD-10-CM | POA: Diagnosis not present

## 2020-10-15 DIAGNOSIS — C7802 Secondary malignant neoplasm of left lung: Principal | ICD-10-CM | POA: Diagnosis present

## 2020-10-15 DIAGNOSIS — I130 Hypertensive heart and chronic kidney disease with heart failure and stage 1 through stage 4 chronic kidney disease, or unspecified chronic kidney disease: Secondary | ICD-10-CM | POA: Diagnosis not present

## 2020-10-15 DIAGNOSIS — I509 Heart failure, unspecified: Secondary | ICD-10-CM

## 2020-10-15 DIAGNOSIS — Z7901 Long term (current) use of anticoagulants: Secondary | ICD-10-CM | POA: Diagnosis not present

## 2020-10-15 DIAGNOSIS — Z4682 Encounter for fitting and adjustment of non-vascular catheter: Secondary | ICD-10-CM

## 2020-10-15 DIAGNOSIS — R911 Solitary pulmonary nodule: Secondary | ICD-10-CM | POA: Diagnosis present

## 2020-10-15 DIAGNOSIS — Z20822 Contact with and (suspected) exposure to covid-19: Secondary | ICD-10-CM | POA: Diagnosis not present

## 2020-10-15 DIAGNOSIS — R918 Other nonspecific abnormal finding of lung field: Secondary | ICD-10-CM

## 2020-10-15 DIAGNOSIS — Z902 Acquired absence of lung [part of]: Secondary | ICD-10-CM

## 2020-10-15 DIAGNOSIS — C649 Malignant neoplasm of unspecified kidney, except renal pelvis: Secondary | ICD-10-CM | POA: Diagnosis not present

## 2020-10-15 DIAGNOSIS — Z09 Encounter for follow-up examination after completed treatment for conditions other than malignant neoplasm: Secondary | ICD-10-CM

## 2020-10-15 DIAGNOSIS — J449 Chronic obstructive pulmonary disease, unspecified: Secondary | ICD-10-CM | POA: Diagnosis not present

## 2020-10-15 DIAGNOSIS — Z87891 Personal history of nicotine dependence: Secondary | ICD-10-CM

## 2020-10-15 DIAGNOSIS — Z419 Encounter for procedure for purposes other than remedying health state, unspecified: Secondary | ICD-10-CM

## 2020-10-15 HISTORY — PX: NODE DISSECTION: SHX5269

## 2020-10-15 HISTORY — PX: INTERCOSTAL NERVE BLOCK: SHX5021

## 2020-10-15 SURGERY — WEDGE RESECTION, LUNG, ROBOT-ASSISTED, THORACOSCOPIC
Anesthesia: General | Site: Chest | Laterality: Left

## 2020-10-15 MED ORDER — SUGAMMADEX SODIUM 200 MG/2ML IV SOLN
INTRAVENOUS | Status: DC | PRN
  Administered 2020-10-15: 200 mg via INTRAVENOUS

## 2020-10-15 MED ORDER — ACETAMINOPHEN 500 MG PO TABS
1000.0000 mg | ORAL_TABLET | Freq: Four times a day (QID) | ORAL | Status: AC
  Administered 2020-10-15 – 2020-10-20 (×18): 1000 mg via ORAL
  Filled 2020-10-15 (×19): qty 2

## 2020-10-15 MED ORDER — CARVEDILOL 12.5 MG PO TABS
12.5000 mg | ORAL_TABLET | Freq: Two times a day (BID) | ORAL | Status: DC
  Administered 2020-10-16 – 2020-10-26 (×21): 12.5 mg via ORAL
  Filled 2020-10-15 (×7): qty 1
  Filled 2020-10-15: qty 2
  Filled 2020-10-15 (×14): qty 1

## 2020-10-15 MED ORDER — FENTANYL CITRATE (PF) 250 MCG/5ML IJ SOLN
INTRAMUSCULAR | Status: DC | PRN
  Administered 2020-10-15: 100 ug via INTRAVENOUS
  Administered 2020-10-15: 50 ug via INTRAVENOUS

## 2020-10-15 MED ORDER — GLYCOPYRROLATE PF 0.2 MG/ML IJ SOSY
PREFILLED_SYRINGE | INTRAMUSCULAR | Status: AC
  Filled 2020-10-15: qty 1

## 2020-10-15 MED ORDER — HEMOSTATIC AGENTS (NO CHARGE) OPTIME
TOPICAL | Status: DC | PRN
  Administered 2020-10-15: 1 via TOPICAL

## 2020-10-15 MED ORDER — FENTANYL CITRATE PF 50 MCG/ML IJ SOSY
25.0000 ug | PREFILLED_SYRINGE | INTRAMUSCULAR | Status: DC | PRN
  Administered 2020-10-18: 50 ug via INTRAVENOUS
  Administered 2020-10-21: 25 ug via INTRAVENOUS
  Filled 2020-10-15 (×2): qty 1

## 2020-10-15 MED ORDER — SACUBITRIL-VALSARTAN 49-51 MG PO TABS
1.0000 | ORAL_TABLET | Freq: Two times a day (BID) | ORAL | Status: DC
  Administered 2020-10-16 – 2020-10-26 (×21): 1 via ORAL
  Filled 2020-10-15 (×22): qty 1

## 2020-10-15 MED ORDER — DEXAMETHASONE SODIUM PHOSPHATE 10 MG/ML IJ SOLN
INTRAMUSCULAR | Status: DC | PRN
  Administered 2020-10-15: 10 mg via INTRAVENOUS

## 2020-10-15 MED ORDER — ALBUTEROL SULFATE HFA 108 (90 BASE) MCG/ACT IN AERS
INHALATION_SPRAY | RESPIRATORY_TRACT | Status: AC
  Filled 2020-10-15: qty 6.7

## 2020-10-15 MED ORDER — TRAMADOL HCL 50 MG PO TABS
50.0000 mg | ORAL_TABLET | Freq: Four times a day (QID) | ORAL | Status: DC | PRN
  Administered 2020-10-16 – 2020-10-19 (×4): 100 mg via ORAL
  Administered 2020-10-25: 50 mg via ORAL
  Filled 2020-10-15: qty 2
  Filled 2020-10-15: qty 1
  Filled 2020-10-15 (×3): qty 2

## 2020-10-15 MED ORDER — PROPOFOL 10 MG/ML IV BOLUS
INTRAVENOUS | Status: AC
  Filled 2020-10-15: qty 20

## 2020-10-15 MED ORDER — ONDANSETRON HCL 4 MG/2ML IJ SOLN
INTRAMUSCULAR | Status: AC
  Filled 2020-10-15: qty 2

## 2020-10-15 MED ORDER — ACETAMINOPHEN 500 MG PO TABS
ORAL_TABLET | ORAL | Status: AC
  Administered 2020-10-15: 1000 mg via ORAL
  Filled 2020-10-15: qty 2

## 2020-10-15 MED ORDER — PROPOFOL 10 MG/ML IV BOLUS
INTRAVENOUS | Status: DC | PRN
  Administered 2020-10-15: 50 mg via INTRAVENOUS
  Administered 2020-10-15: 120 mg via INTRAVENOUS

## 2020-10-15 MED ORDER — CEFAZOLIN SODIUM-DEXTROSE 2-4 GM/100ML-% IV SOLN
2.0000 g | INTRAVENOUS | Status: AC
  Administered 2020-10-15: 2 g via INTRAVENOUS

## 2020-10-15 MED ORDER — CEFAZOLIN SODIUM-DEXTROSE 2-4 GM/100ML-% IV SOLN
INTRAVENOUS | Status: AC
  Administered 2020-10-15: 2 g via INTRAVENOUS
  Filled 2020-10-15: qty 100

## 2020-10-15 MED ORDER — ACETAMINOPHEN 500 MG PO TABS: 1000.0000 mg | ORAL_TABLET | Freq: Once | ORAL | Status: AC

## 2020-10-15 MED ORDER — ONDANSETRON HCL 4 MG/2ML IJ SOLN
INTRAMUSCULAR | Status: DC | PRN
  Administered 2020-10-15: 4 mg via INTRAVENOUS

## 2020-10-15 MED ORDER — ONDANSETRON HCL 4 MG/2ML IJ SOLN
4.0000 mg | Freq: Four times a day (QID) | INTRAMUSCULAR | Status: DC | PRN
Start: 2020-10-15 — End: 2020-10-26
  Filled 2020-10-15: qty 2

## 2020-10-15 MED ORDER — ROCURONIUM BROMIDE 10 MG/ML (PF) SYRINGE
PREFILLED_SYRINGE | INTRAVENOUS | Status: DC | PRN
  Administered 2020-10-15 (×2): 10 mg via INTRAVENOUS
  Administered 2020-10-15: 60 mg via INTRAVENOUS
  Administered 2020-10-15 (×2): 10 mg via INTRAVENOUS

## 2020-10-15 MED ORDER — ENOXAPARIN SODIUM 40 MG/0.4ML IJ SOSY
40.0000 mg | PREFILLED_SYRINGE | Freq: Every day | INTRAMUSCULAR | Status: DC
Start: 2020-10-16 — End: 2020-10-19
  Administered 2020-10-16 – 2020-10-18 (×3): 40 mg via SUBCUTANEOUS
  Filled 2020-10-15 (×3): qty 0.4

## 2020-10-15 MED ORDER — CEFAZOLIN SODIUM-DEXTROSE 2-4 GM/100ML-% IV SOLN
2.0000 g | Freq: Three times a day (TID) | INTRAVENOUS | Status: AC
Start: 2020-10-15 — End: 2020-10-16
  Administered 2020-10-16: 2 g via INTRAVENOUS
  Filled 2020-10-15 (×2): qty 100

## 2020-10-15 MED ORDER — LIDOCAINE 2% (20 MG/ML) 5 ML SYRINGE
INTRAMUSCULAR | Status: DC | PRN
  Administered 2020-10-15: 100 mg via INTRAVENOUS

## 2020-10-15 MED ORDER — BISACODYL 5 MG PO TBEC
10.0000 mg | DELAYED_RELEASE_TABLET | Freq: Every day | ORAL | Status: DC
  Administered 2020-10-16 – 2020-10-26 (×7): 10 mg via ORAL
  Filled 2020-10-15 (×9): qty 2

## 2020-10-15 MED ORDER — LIDOCAINE 2% (20 MG/ML) 5 ML SYRINGE
INTRAMUSCULAR | Status: AC
  Filled 2020-10-15: qty 5

## 2020-10-15 MED ORDER — DEXTROSE-NACL 5-0.9 % IV SOLN: INTRAVENOUS | Status: DC

## 2020-10-15 MED ORDER — SODIUM CHLORIDE FLUSH 0.9 % IV SOLN
INTRAVENOUS | Status: DC | PRN
  Administered 2020-10-15: 92 mL

## 2020-10-15 MED ORDER — MIDAZOLAM HCL 2 MG/2ML IJ SOLN
INTRAMUSCULAR | Status: AC
  Filled 2020-10-15: qty 2

## 2020-10-15 MED ORDER — PHENYLEPHRINE 40 MCG/ML (10ML) SYRINGE FOR IV PUSH (FOR BLOOD PRESSURE SUPPORT)
PREFILLED_SYRINGE | INTRAVENOUS | Status: AC
  Filled 2020-10-15: qty 10

## 2020-10-15 MED ORDER — FUROSEMIDE 20 MG PO TABS
20.0000 mg | ORAL_TABLET | Freq: Every day | ORAL | Status: DC
  Administered 2020-10-16 – 2020-10-17 (×2): 20 mg via ORAL
  Filled 2020-10-15 (×2): qty 1

## 2020-10-15 MED ORDER — ALBUMIN HUMAN 5 % IV SOLN: INTRAVENOUS | Status: DC | PRN

## 2020-10-15 MED ORDER — CHLORHEXIDINE GLUCONATE 0.12 % MT SOLN: 15.0000 mL | Freq: Once | OROMUCOSAL | Status: AC

## 2020-10-15 MED ORDER — OXYCODONE HCL 5 MG PO TABS: 5.0000 mg | ORAL_TABLET | Freq: Once | ORAL | Status: DC | PRN

## 2020-10-15 MED ORDER — SODIUM CHLORIDE 0.9 % IV SOLN
INTRAVENOUS | Status: AC | PRN
  Administered 2020-10-15: 1000 mL via INTRAMUSCULAR

## 2020-10-15 MED ORDER — PHENYLEPHRINE HCL-NACL 20-0.9 MG/250ML-% IV SOLN
INTRAVENOUS | Status: DC | PRN
  Administered 2020-10-15 (×2): 20 ug/min via INTRAVENOUS

## 2020-10-15 MED ORDER — ACETAMINOPHEN 10 MG/ML IV SOLN: 1000.0000 mg | Freq: Once | INTRAVENOUS | Status: DC | PRN

## 2020-10-15 MED ORDER — CHLORHEXIDINE GLUCONATE CLOTH 2 % EX PADS
6.0000 | MEDICATED_PAD | Freq: Every day | CUTANEOUS | Status: DC
  Administered 2020-10-18 – 2020-10-25 (×7): 6 via TOPICAL

## 2020-10-15 MED ORDER — ROCURONIUM BROMIDE 10 MG/ML (PF) SYRINGE
PREFILLED_SYRINGE | INTRAVENOUS | Status: AC
  Filled 2020-10-15: qty 10

## 2020-10-15 MED ORDER — LACTATED RINGERS IV SOLN: INTRAVENOUS | Status: DC

## 2020-10-15 MED ORDER — ACETAMINOPHEN 160 MG/5ML PO SOLN
1000.0000 mg | Freq: Four times a day (QID) | ORAL | Status: AC
Start: 2020-10-15 — End: 2020-10-20

## 2020-10-15 MED ORDER — LACTATED RINGERS IV SOLN: INTRAVENOUS | Status: DC | PRN

## 2020-10-15 MED ORDER — OXYCODONE HCL 5 MG/5ML PO SOLN: 5.0000 mg | Freq: Once | ORAL | Status: DC | PRN

## 2020-10-15 MED ORDER — ALBUTEROL SULFATE HFA 108 (90 BASE) MCG/ACT IN AERS
INHALATION_SPRAY | RESPIRATORY_TRACT | Status: DC | PRN
  Administered 2020-10-15: 3 via RESPIRATORY_TRACT
  Administered 2020-10-15: 6 via RESPIRATORY_TRACT

## 2020-10-15 MED ORDER — SENNOSIDES-DOCUSATE SODIUM 8.6-50 MG PO TABS
1.0000 | ORAL_TABLET | Freq: Every day | ORAL | Status: DC
  Administered 2020-10-16 – 2020-10-25 (×8): 1 via ORAL
  Filled 2020-10-15 (×10): qty 1

## 2020-10-15 MED ORDER — BUPIVACAINE LIPOSOME 1.3 % IJ SUSP
INTRAMUSCULAR | Status: AC
  Filled 2020-10-15: qty 20

## 2020-10-15 MED ORDER — ORAL CARE MOUTH RINSE: 15.0000 mL | Freq: Once | OROMUCOSAL | Status: AC

## 2020-10-15 MED ORDER — PHENYLEPHRINE 40 MCG/ML (10ML) SYRINGE FOR IV PUSH (FOR BLOOD PRESSURE SUPPORT)
PREFILLED_SYRINGE | INTRAVENOUS | Status: DC | PRN
  Administered 2020-10-15 (×2): 120 ug via INTRAVENOUS
  Administered 2020-10-15: 80 ug via INTRAVENOUS

## 2020-10-15 MED ORDER — FENTANYL CITRATE (PF) 250 MCG/5ML IJ SOLN
INTRAMUSCULAR | Status: AC
  Filled 2020-10-15: qty 5

## 2020-10-15 MED ORDER — 0.9 % SODIUM CHLORIDE (POUR BTL) OPTIME
TOPICAL | Status: DC | PRN
  Administered 2020-10-15: 2000 mL

## 2020-10-15 MED ORDER — CHLORHEXIDINE GLUCONATE 0.12 % MT SOLN
OROMUCOSAL | Status: AC
  Administered 2020-10-15: 15 mL via OROMUCOSAL
  Filled 2020-10-15: qty 15

## 2020-10-15 MED ORDER — ALBUTEROL SULFATE (2.5 MG/3ML) 0.083% IN NEBU
2.5000 mg | INHALATION_SOLUTION | RESPIRATORY_TRACT | Status: DC
  Administered 2020-10-16 – 2020-10-17 (×6): 2.5 mg via RESPIRATORY_TRACT
  Filled 2020-10-15 (×7): qty 3

## 2020-10-15 MED ORDER — DEXAMETHASONE SODIUM PHOSPHATE 10 MG/ML IJ SOLN
INTRAMUSCULAR | Status: AC
  Filled 2020-10-15: qty 1

## 2020-10-15 MED ORDER — FENTANYL CITRATE (PF) 100 MCG/2ML IJ SOLN: 25.0000 ug | INTRAMUSCULAR | Status: DC | PRN

## 2020-10-15 MED ORDER — BUPIVACAINE HCL (PF) 0.5 % IJ SOLN
INTRAMUSCULAR | Status: AC
  Filled 2020-10-15: qty 30

## 2020-10-15 MED ORDER — EPHEDRINE SULFATE-NACL 50-0.9 MG/10ML-% IV SOSY
PREFILLED_SYRINGE | INTRAVENOUS | Status: DC | PRN
  Administered 2020-10-15: 10 mg via INTRAVENOUS

## 2020-10-15 MED ORDER — GLYCOPYRROLATE PF 0.2 MG/ML IJ SOSY
PREFILLED_SYRINGE | INTRAMUSCULAR | Status: DC | PRN
  Administered 2020-10-15: .2 mg via INTRAVENOUS

## 2020-10-15 SURGICAL SUPPLY — 112 items
APPLIER CLIP ROT 10 11.4 M/L (STAPLE)
BLADE CLIPPER SURG (BLADE) ×2 IMPLANT
BNDG COHESIVE 6X5 TAN STRL LF (GAUZE/BANDAGES/DRESSINGS) IMPLANT
CANISTER SUCT 3000ML PPV (MISCELLANEOUS) ×4 IMPLANT
CANNULA REDUC XI 12-8 STAPL (CANNULA) ×2
CANNULA REDUCER 12-8 DVNC XI (CANNULA) ×2 IMPLANT
CATH THORACIC 28FR (CATHETERS) IMPLANT
CATH THORACIC 28FR RT ANG (CATHETERS) IMPLANT
CATH THORACIC 36FR (CATHETERS) IMPLANT
CATH THORACIC 36FR RT ANG (CATHETERS) IMPLANT
CLIP APPLIE ROT 10 11.4 M/L (STAPLE) IMPLANT
CLIP VESOCCLUDE MED 6/CT (CLIP) IMPLANT
CNTNR URN SCR LID CUP LEK RST (MISCELLANEOUS) ×5 IMPLANT
CONN ST 1/4X3/8  BEN (MISCELLANEOUS) ×1
CONN ST 1/4X3/8 BEN (MISCELLANEOUS) ×1 IMPLANT
CONT SPEC 4OZ STRL OR WHT (MISCELLANEOUS) ×5
DEFOGGER SCOPE WARMER CLEARIFY (MISCELLANEOUS) ×4 IMPLANT
DERMABOND ADVANCED (GAUZE/BANDAGES/DRESSINGS) ×1
DERMABOND ADVANCED .7 DNX12 (GAUZE/BANDAGES/DRESSINGS) ×1 IMPLANT
DRAIN CHANNEL 28F RND 3/8 FF (WOUND CARE) ×2 IMPLANT
DRAIN CHANNEL 32F RND 10.7 FF (WOUND CARE) IMPLANT
DRAPE ARM DVNC X/XI (DISPOSABLE) ×4 IMPLANT
DRAPE COLUMN DVNC XI (DISPOSABLE) ×1 IMPLANT
DRAPE CV SPLIT W-CLR ANES SCRN (DRAPES) ×2 IMPLANT
DRAPE DA VINCI XI ARM (DISPOSABLE) ×4
DRAPE DA VINCI XI COLUMN (DISPOSABLE) ×1
DRAPE HALF SHEET 40X57 (DRAPES) ×2 IMPLANT
DRAPE INCISE IOBAN 66X45 STRL (DRAPES) ×2 IMPLANT
DRAPE ORTHO SPLIT 77X108 STRL (DRAPES) ×1
DRAPE SURG ORHT 6 SPLT 77X108 (DRAPES) ×1 IMPLANT
ELECT BLADE 6.5 EXT (BLADE) IMPLANT
ELECT REM PT RETURN 9FT ADLT (ELECTROSURGICAL) ×2
ELECTRODE REM PT RTRN 9FT ADLT (ELECTROSURGICAL) ×1 IMPLANT
GAUZE KITTNER 4X5 RF (MISCELLANEOUS) ×8 IMPLANT
GAUZE SPONGE 4X4 12PLY STRL (GAUZE/BANDAGES/DRESSINGS) ×2 IMPLANT
GLOVE TRIUMPH SURG SIZE 7.5 (KITS) ×4 IMPLANT
GOWN STRL REUS W/ TWL LRG LVL3 (GOWN DISPOSABLE) ×2 IMPLANT
GOWN STRL REUS W/ TWL XL LVL3 (GOWN DISPOSABLE) ×2 IMPLANT
GOWN STRL REUS W/TWL 2XL LVL3 (GOWN DISPOSABLE) ×2 IMPLANT
GOWN STRL REUS W/TWL LRG LVL3 (GOWN DISPOSABLE) ×2
GOWN STRL REUS W/TWL XL LVL3 (GOWN DISPOSABLE) ×2
HEMOSTAT SURGICEL 2X14 (HEMOSTASIS) ×4 IMPLANT
IRRIGATION STRYKERFLOW (MISCELLANEOUS) ×1 IMPLANT
IRRIGATOR STRYKERFLOW (MISCELLANEOUS) ×2
KIT BASIN OR (CUSTOM PROCEDURE TRAY) ×2 IMPLANT
KIT SUCTION CATH 14FR (SUCTIONS) IMPLANT
KIT TURNOVER KIT B (KITS) ×2 IMPLANT
LOOP VESSEL SUPERMAXI WHITE (MISCELLANEOUS) IMPLANT
NEEDLE HYPO 25GX1X1/2 BEV (NEEDLE) ×2 IMPLANT
NEEDLE SPNL 22GX3.5 QUINCKE BK (NEEDLE) ×2 IMPLANT
NS IRRIG 1000ML POUR BTL (IV SOLUTION) ×2 IMPLANT
OBTURATOR OPTICAL STANDARD 8MM (TROCAR)
OBTURATOR OPTICAL STND 8 DVNC (TROCAR)
OBTURATOR OPTICALSTD 8 DVNC (TROCAR) IMPLANT
PACK CHEST (CUSTOM PROCEDURE TRAY) ×2 IMPLANT
PAD ARMBOARD 7.5X6 YLW CONV (MISCELLANEOUS) ×4 IMPLANT
PORT ACCESS TROCAR AIRSEAL 12 (TROCAR) ×1 IMPLANT
PORT ACCESS TROCAR AIRSEAL 5M (TROCAR) ×1
RELOAD STAPLER 3.5X45 BLU DVNC (STAPLE) ×8 IMPLANT
RELOAD STAPLER 4.3X45 GRN DVNC (STAPLE) ×2 IMPLANT
RELOAD STAPLER 45 4.6 BLK DVNC (STAPLE) ×1 IMPLANT
SCISSORS LAP 5X35 DISP (ENDOMECHANICALS) IMPLANT
SEAL CANN UNIV 5-8 DVNC XI (MISCELLANEOUS) ×2 IMPLANT
SEAL XI 5MM-8MM UNIVERSAL (MISCELLANEOUS) ×2
SEALANT PROGEL (MISCELLANEOUS) ×2 IMPLANT
SEALANT SURG COSEAL 4ML (VASCULAR PRODUCTS) IMPLANT
SEALANT SURG COSEAL 8ML (VASCULAR PRODUCTS) IMPLANT
SET TRI-LUMEN FLTR TB AIRSEAL (TUBING) ×2 IMPLANT
SET TUBE SMOKE EVAC HIGH FLOW (TUBING) IMPLANT
SOLUTION ELECTROLUBE (MISCELLANEOUS) ×2 IMPLANT
SPONGE INTESTINAL PEANUT (DISPOSABLE) IMPLANT
SPONGE TONSIL TAPE 1 RFD (DISPOSABLE) IMPLANT
STAPLER 45 DA VINCI SURE FORM (STAPLE) ×1
STAPLER 45 SUREFORM DVNC (STAPLE) ×1 IMPLANT
STAPLER CANNULA SEAL DVNC XI (STAPLE) ×2 IMPLANT
STAPLER CANNULA SEAL XI (STAPLE) ×2
STAPLER RELOAD 3.5X45 BLU DVNC (STAPLE) ×8
STAPLER RELOAD 3.5X45 BLUE (STAPLE) ×8
STAPLER RELOAD 4.3X45 GREEN (STAPLE) ×2
STAPLER RELOAD 4.3X45 GRN DVNC (STAPLE) ×2
STAPLER RELOAD 45 4.6 BLK (STAPLE) ×1
STAPLER RELOAD 45 4.6 BLK DVNC (STAPLE) ×1
SUT PDS AB 3-0 SH 27 (SUTURE) IMPLANT
SUT PROLENE 4 0 RB 1 (SUTURE)
SUT PROLENE 4-0 RB1 .5 CRCL 36 (SUTURE) IMPLANT
SUT SILK  1 MH (SUTURE) ×2
SUT SILK 1 MH (SUTURE) ×2 IMPLANT
SUT SILK 1 TIES 10X30 (SUTURE) IMPLANT
SUT SILK 2 0 SH (SUTURE) IMPLANT
SUT SILK 2 0SH CR/8 30 (SUTURE) ×2 IMPLANT
SUT SILK 3 0SH CR/8 30 (SUTURE) IMPLANT
SUT VIC AB 1 CTX 36 (SUTURE)
SUT VIC AB 1 CTX36XBRD ANBCTR (SUTURE) IMPLANT
SUT VIC AB 2-0 CTX 36 (SUTURE) IMPLANT
SUT VIC AB 3-0 MH 27 (SUTURE) IMPLANT
SUT VIC AB 3-0 X1 27 (SUTURE) ×4 IMPLANT
SUT VICRYL 0 TIES 12 18 (SUTURE) ×2 IMPLANT
SUT VICRYL 0 UR6 27IN ABS (SUTURE) ×4 IMPLANT
SUT VICRYL 2 TP 1 (SUTURE) IMPLANT
SYR 20ML LL LF (SYRINGE) ×4 IMPLANT
SYSTEM RETRIEVAL ANCHOR 8 (MISCELLANEOUS) ×2 IMPLANT
SYSTEM SAHARA CHEST DRAIN ATS (WOUND CARE) ×2 IMPLANT
TAPE CLOTH 4X10 WHT NS (GAUZE/BANDAGES/DRESSINGS) ×2 IMPLANT
TAPE CLOTH SURG 4X10 WHT LF (GAUZE/BANDAGES/DRESSINGS) ×2 IMPLANT
TIP APPLICATOR SPRAY EXTEND 16 (VASCULAR PRODUCTS) ×4 IMPLANT
TOWEL GREEN STERILE (TOWEL DISPOSABLE) ×4 IMPLANT
TRAY FOLEY MTR SLVR 16FR STAT (SET/KITS/TRAYS/PACK) ×2 IMPLANT
TRAY WAYNE PNEUMOTHORAX 14X18 (TRAY / TRAY PROCEDURE) ×2 IMPLANT
TROCAR BLADELESS 15MM (ENDOMECHANICALS) IMPLANT
TROCAR XCEL 12X100 BLDLESS (ENDOMECHANICALS) IMPLANT
TROCAR XCEL BLADELESS 5X75MML (TROCAR) IMPLANT
WATER STERILE IRR 1000ML POUR (IV SOLUTION) ×2 IMPLANT

## 2020-10-15 NOTE — Discharge Summary (Addendum)
Physician Discharge Summary  Patient ID: Beckhem Damery MRN: [id number] DOB/AGE: August 13, 1950 70 y.o.  Admit date: 10/15/2020 Discharge date: 10/26/2020  Admission Diagnoses: Left lower lobe lung nodule  Discharge Diagnoses:  S/p Xi robotic assisted left VATS, lysis of adhesions, wedge LLL, LN sampling, pleural biopsy, and intercostal nerve block Metastatic renal cell carcinoma  Patient Active Problem List   Diagnosis Date Noted   Mass of left lung 10/15/2020   Neoplasm of left kidney 02/21/2019   Renal mass 02/20/2019   Former smoker 01/07/2019   CKD (chronic kidney disease), stage III ([place]) 05/24/2017   CHF (congestive heart failure) ([place]) 04/09/2017   COPD (chronic obstructive pulmonary disease) ([place]) 04/09/2017   Pulmonary embolism ([place]) 04/09/2017   History of renal stone 05/10/2015   HTN (hypertension), benign 05/10/2015     Discharged Condition: stable  HPI: at time of consultation   Tiam Neller is a 70 year old man with a past medical history significant for hypertension, kidney stones, renal cell carcinoma, left nephrectomy, stage III chronic kidney disease, congestive heart failure, pulmonary embolism, tobacco abuse, and COPD.  He smoked about 2 packs of cigarettes daily prior to quitting in 1990.  He had a left nephrectomy by Dr. Alinda Money back in 2021.  He has been followed with scans since then.  He has had some unusual appearing findings in his lungs and has extensive scarring as well as some bronchiectasis and probably mucous plugging bilaterally.  9 months ago there was a new tiny nodule in the anterior basilar left lower lobe.  That nodule has grown over time.  A PET/CT recently showed moderate uptake.  There was no evidence of disease elsewhere.  Findings were suspicious for metastatic renal cell carcinoma.  Mr. Regina is accompanied by his brother and wife.  They provided the majority of the history and medical background.  He denies any chest pain, pressure,  tightness.  No history of MI.  He does have a history of congestive heart failure and hypertension that family says started around the time he had his pulmonary emboli.  They think that was about 3 years ago.  He is on Eliquis for that.  The patient and all studies were reviewed by Dr. Roxan Hockey who recommended proceeding with elective surgical intervention.  Hospital course: The patient was admitted electively and taken the operating room on 10/15/2020 at which time he underwent left robotic assisted thoracoscopic surgery.  He had a left lower lobe wedge resection, lymph node sampling, and pleural biopsies.  Initial frozen section findings were consistent with metastatic renal cell carcinoma.  Postoperative hospital course: After recovery in the PACU Mr. Muska was transferred to [facility] with the chest tube on water seal. The CXR showed good expansion of the left lung. On post-op day 1, tthe CXR showed he had developed a pneumothorax.  The tube was placed back to suction with resolution of the PTX. On the following day the air leak was smaller so the tube was placed back to water seal but he again developed a PTX and became dyspneic.  The tube was placed back to suction and his symptoms improved. He was restarted on Apixaban for history of PE on 09/06. We tried water seal on 9/7. He continued to have an air leak. He continued to have a small air leak, which did resolve. We hooked his chest tube to a mini express on 9/9.  Follow up CXR showed decrease in pneumothorax and improvement of medial air.  He has been difficult to wean  off oxygen due to his severe underlying COPD.  Home use has been arranged.  The patient surgical incisions are healing without evidence of infection.  His blake drain will be removed on 9/12. His pigtail catheter was removed on 9/13. His repeat CXR showed stable size pntx.  He is medically stable for discharge home today.    Consults: None  Significant Diagnostic Studies: nuclear  medicine:   IMPRESSION: 1. Enlarging round hypermetabolic nodule in the LEFT lower lobe consistent with neoplasm. Favor metastatic renal cell carcinoma. 2. No additional evidence of metastatic disease in the chest abdomen pelvis. 3. Post LEFT nephrectomy without evidence local recurrence. 4. No evidence skeletal metastasis 5. Additional peribronchial thickening and scarring in the lungs without metabolic activity is favored benign.  Treatments:  Xi robotic-assisted left video-assisted thoracoscopy, lysis of adhesions, wedge resection of left lower lobe nodule, lymph node sampling and pleural biopsy and intercostal nerve blocks levels 3 through 10 by Dr. Roxan Hockey on 10/15/2020.  Pathology:  SURGICAL PATHOLOGY  CASE: [id number]  PATIENT: Toy Baker  Surgical Pathology Report   Clinical History: LLL lung nodule (cm)   FINAL MICROSCOPIC DIAGNOSIS:   A. LUNG, LEFT LOWER LOBE, WEDGE RESECTION:  - Metastatic carcinoma with cytoplasmic clearing, 1.6 cm, consistent  with patient's clinical history of primary renal cell carcinoma  - Stapled resection margins negative for carcinoma   B. LYMPH NODE, 9, EXCISION:  - Fibrotic nodule with dystrophic calcification  - Negative for carcinoma   C. LYMPH NODE, 10, EXCISION:  - Fibrotic nodule with dystrophic calcification  - Negative for carcinoma   D. PLEURA, BIOPSY:  - Benign pleura with hyaline fibrosis  - Negative for carcinoma   INTRAOPERATIVE DIAGNOSIS:  A.  Left lower lobe wedge: "Morphologically consistent with renal cell  carcinoma; margins free (rim of lung at ink)."  Intraoperative diagnosis rendered by Dr. Melina Copa at 11:23 AM on 10/15/2020.   GROSS DESCRIPTION:   A: Received fresh for rapid intraoperative consult is a 5 g, 4.5 x 2.2 x  1.6 cm wedge resection of lung.  The margin is stapled.  The pleura is  smooth, tan-red anthracotic.  The cut surface shows a 1.6 x 1.5 x 1.2 cm  well-circumscribed firm tan-yellow  nodule.  The nodules located 0.5 cm  from the stapled margin.  The surrounding parenchyma is spongy  red-brown.  A section of the nodule to include the inked margin is  submitted for frozen section.  Sections are submitted in 3 blocks.  1 = section submitted for frozen section  2, 3 = additional sections of nodule   B: Received fresh is a 0.6 x 0.6 x 0.5 cm calcified anthracotic nodule.  The specimen is entirely submitted in 1 cassette following  decalcification.   C: Received fresh are portions of tan to anthracotic, focally calcified  tissue measuring 1.5 x 1.5 x 0.3 cm in aggregate.  The specimen is  entirely submitted in 1 cassette following decalcification.   D: Received fresh are 3 portions of tan-white membranous tissue  measuring 4.8 x 3 x 0.3 cm in aggregate.  The surfaces are smooth to  slightly roughened.  Sections are submitted in 1 cassette.  [facility]  10/15/2020)    Final Diagnosis performed by Jaquita Folds, MD.   Electronically  signed 10/19/2020  Technical component performed at [facility]. [facility], [place] [address] [place], [place] [address].   Professional component performed at [facility],  [place] [address] [place], [place]  [address].   Immunohistochemistry Technical component (if applicable) was performed  at [facility]. [address], [place],  [place], [place] [address].   IMMUNOHISTOCHEMISTRY DISCLAIMER (if applicable):  Some of these immunohistochemical stains may have been developed and the  performance characteristics determine by [facility]. Some  may not have been cleared or approved by the U.S. Food and Drug  Administration. The FDA has determined that such clearance or approval  is not necessary. This test is used for clinical purposes. It should not  be regarded as investigational or for research. This laboratory is  certified under the [place]   (CLIA-88) as qualified to perform high complexity clinical laboratory  testing.  The controls stained appropriately.   Discharge Exam: Blood pressure 122/69, pulse 60, temperature 97.9 F (36.6 C), temperature source Oral, resp. rate (!) 22, height 6' (1.829 m), weight 99.8 kg, SpO2 97 %.  General appearance: alert, cooperative, and no distress Heart: irregularly irregular rhythm Lungs: clear to auscultation bilaterally Abdomen: soft, non-tender; bowel sounds normal; no masses,  no organomegaly Extremities: extremities normal, atraumatic, no cyanosis or edema Wound: clean and dry  Disposition: Discharge disposition: 01-Home or Self Care    Stable and discharge.  Discharge Instructions     Discharge patient   Complete by: As directed    With home O2   Discharge disposition: 01-Home or Self Care   Discharge patient date: 10/26/2020      Allergies as of 10/26/2020       Reactions   Codeine Hives   Nausea, vomiting, hives    Morphine And Related Hives   Hives, nausea and vomiting        Medication List     TAKE these medications    albuterol 108 (90 Base) MCG/ACT inhaler Commonly known as: VENTOLIN HFA Inhale 2 puffs into the lungs every 6 (six) hours as needed for wheezing or shortness of breath.   carvedilol 12.5 MG tablet Commonly known as: COREG Take 12.5 mg by mouth 2 (two) times daily.   Eliquis 5 MG Tabs tablet Generic drug: apixaban Take 5 mg by mouth 2 (two) times daily.   furosemide 20 MG tablet Commonly known as: LASIX Take 20 mg by mouth daily.   sacubitril-valsartan 49-51 MG Commonly known as: ENTRESTO Take 1 tablet by mouth 2 (two) times daily.   traMADol 50 MG tablet Commonly known as: ULTRAM Take 1 tablet (50 mg total) by mouth every 6 (six) hours as needed (mild pain).               Durable Medical Equipment  (From admission, onward)           Start     Ordered   10/25/20 1258  For home use only DME oxygen  Once        Question Answer Comment  Length of Need 6 Months   Mode or (Route) Nasal cannula   Liters per Minute 2   Frequency Continuous (stationary and portable oxygen unit needed)   Oxygen delivery system Gas      10/25/20 1257            Follow-up Information     [name], MD Follow up.   Specialty: Cardiothoracic Surgery Why: Please see discharge paperwork for follow-up appointment with Dr. Roxan Hockey.  Also, obtain a chest x-ray at [place] 1/2-hour prior to appointment.  It is located in the same office complex on the first  floor. Contact information: [address] [place] [address] [place] [address] [phone]         [facility] Follow up.   Why: home oxygen Contact information: [place] [address] [phone]         [facility], [facility] Follow up.   Why: HHRN contact [place] at [phone] for any quiestions regarding home health Contact information: [place] [address] [phone]                 Signed: John Giovanni PA-C 10/26/2020, 12:59 PM

## 2020-10-15 NOTE — Brief Op Note (Addendum)
10/15/2020  12:04 PM  PATIENT:  Kenneth Fischer  70 y.o. male  PRE-OPERATIVE DIAGNOSIS:  LLL LUNG NODULE  POST-OPERATIVE DIAGNOSIS:  METASTATIC RENAL CELL CARCINOMA  PROCEDURE:  Xi- ROBOTIC ASSISTED LEFT VATS LYSIS OF ADHESIONS WEDGE RESECTION LEFT LOWER LOBE NODULE LYMPH NODE SAMPLING PLEURAL BIOPSY  FROZEN: C/W RENAL CELL CA MET  SURGEON:  Surgeon(s) and Role:    * [name], MD - Primary  PHYSICIAN ASSISTANT: WAYNE GOLD PA-C  ANESTHESIA:   general  EBL:  150 mL   BLOOD ADMINISTERED:none  DRAINS:  LEFT 28 F BLAKE DRAIN AND PIGTAIL CATHETER    LOCAL MEDICATIONS USED:  EXPAREL  SPECIMEN:  Source of Specimen:  LN SAMPLES , LLL WEDGE RESECTION, PLEURAL BIOPSY  DISPOSITION OF SPECIMEN:  PATHOLOGY  COUNTS:  YES  TOURNIQUET:  * No tourniquets in log *  DICTATION: .Other Dictation: Dictation Number [id number]  PLAN OF CARE: Admit to inpatient   PATIENT DISPOSITION:  PACU - hemodynamically stable.   Delay start of Pharmacological VTE agent (>24hrs) due to surgical blood loss or risk of bleeding: yes  COMPLICATIONS: NO KNOWN

## 2020-10-15 NOTE — Transfer of Care (Addendum)
Immediate Anesthesia Transfer of Care Note  Patient: Kenneth Fischer  Procedure(s) Performed: XI ROBOTIC ASSISTED THORASCOPY-WEDGE RESECTION (Left: Chest) INTERCOSTAL NERVE BLOCK (Left: Chest) NODE DISSECTION (Left: Chest)  Patient Location: PACU  Anesthesia Type:General  Level of Consciousness: drowsy  Airway & Oxygen Therapy: Patient Spontanous Breathing and Patient connected to face mask oxygen  Post-op Assessment: Report given to RN and Post -op Vital signs reviewed and stable  Post vital signs: Reviewed and stable  Last Vitals:  Vitals Value Taken Time  BP 136/66 10/15/20 1245  Temp    Pulse 80 10/15/20 1245  Resp 18 10/15/20 1245  SpO2 97 % 10/15/20 1245  Vitals shown include unvalidated device data.  Last Pain:  Vitals:   10/15/20 0604  TempSrc:   PainSc: 0-No pain         Complications: No notable events documented.

## 2020-10-15 NOTE — Interval H&P Note (Signed)
History and Physical Interval Note:  10/15/2020 7:18 AM  Kenneth Fischer  has presented today for surgery, with the diagnosis of LLL LUNG NODULE.  The various methods of treatment have been discussed with the patient and family. After consideration of risks, benefits and other options for treatment, the patient has consented to  Procedure(s): XI ROBOTIC ASSISTED THORASCOPY-WEDGE RESECTION VS BASILAR SEGMENTECTOMY (Left) as a surgical intervention.  The patient's history has been reviewed, patient examined, no change in status, stable for surgery.  I have reviewed the patient's chart and labs.  Questions were answered to the patient's satisfaction.     [name]

## 2020-10-15 NOTE — Plan of Care (Signed)

## 2020-10-15 NOTE — Anesthesia Procedure Notes (Addendum)
Arterial Line Insertion Start/End9/03/2020 7:13 AM, 10/15/2020 7:15 AM Performed by: Brennan Bailey, MD, Reece Agar, CRNA, anesthesiologist  Patient location: Pre-op. Preanesthetic checklist: patient identified, IV checked, site marked, risks and benefits discussed, surgical consent, monitors and equipment checked, pre-op evaluation, timeout performed and anesthesia consent Lidocaine 1% used for infiltration Left, radial was placed Catheter size: 20 G Hand hygiene performed  and maximum sterile barriers used   Attempts: 1 Procedure performed using ultrasound guided technique. Ultrasound Notes:anatomy identified, needle tip was noted to be adjacent to the nerve/plexus identified, no ultrasound evidence of intravascular and/or intraneural injection and image(s) printed for medical record Following insertion, dressing applied and Biopatch. Post procedure assessment: normal and unchanged  Post procedure complications: second provider assisted and unsuccessful attempts. Patient tolerated the procedure well with no immediate complications. Additional procedure comments: CRNA attempt(s) to left radial unsuccessful. One attempt by myself with ultrasound guidance. Daiva Huge, MD.

## 2020-10-15 NOTE — Hospital Course (Addendum)
HPI: at time of consultation   Kenneth Fischer is a 70 year old man with a past medical history significant for hypertension, kidney stones, renal cell carcinoma, left nephrectomy, stage III chronic kidney disease, congestive heart failure, pulmonary embolism, tobacco abuse, and COPD.  He smoked about 2 packs of cigarettes daily prior to quitting in 1990.  He had a left nephrectomy by Dr. Alinda Money back in 2021.  He has been followed with scans since then.  He has had some unusual appearing findings in his lungs and has extensive scarring as well as some bronchiectasis and probably mucous plugging bilaterally.  9 months ago there was a new tiny nodule in the anterior basilar left lower lobe.  That nodule has grown over time.  A PET/CT recently showed moderate uptake.  There was no evidence of disease elsewhere.  Findings were suspicious for metastatic renal cell carcinoma.  [name] is accompanied by his brother and wife.  They provided the majority of the history and medical background.  He denies any chest pain, pressure, tightness.  No history of MI.  He does have a history of congestive heart failure and hypertension that family says started around the time he had his pulmonary emboli.  They think that was about 3 years ago.  He is on Eliquis for that.  The patient and all studies were reviewed by Dr. Roxan Hockey who recommended proceeding with elective surgical intervention.  Hospital course: The patient was admitted electively and taken the operating room on 10/15/2020 at which time he underwent left robotic assisted thoracoscopic surgery.  He had a left lower lobe wedge resection, lymph node sampling, and pleural biopsies.  Initial frozen section findings were consistent with metastatic renal cell carcinoma.  Postoperative hospital course: After recovery in the PACU Mr. Kirschman was transferred to [facility] with the chest tube on water seal. The CXR showed good expansion of the left lung. On post-op day 1,  tthe CXR showed he had developed a pneumothorax.  The tube was placed back to suction with resolution of the PTX. On the following day the air leak was smaller so the tube was placed back to water seal but he again developed a PTX and became dyspneic.  The tube was placed back to suction and his symptoms improved. He was restarted on Apixaban for history of PE on 09/06. We tried water seal on 9/7. He continued to have an air leak. He continued to have a small air leak, which did resolve. We hooked his chest tube to a mini express on 9/9.  Follow up CXR showed decrease in pneumothorax and improvement of medial air.  He has been difficult to wean off oxygen due to his severe underlying COPD.  Home use has been arranged.  The patient surgical incisions are healing without evidence of infection.  His blake drain will be removed on 9/12. He will keep the pigtail in place and plan to go home with the mini express. He is medically stable for discharge home today.

## 2020-10-15 NOTE — Anesthesia Procedure Notes (Signed)
Procedure Name: Intubation Date/Time: 10/15/2020 7:54 AM Performed by: Brennan Bailey, MD Pre-anesthesia Checklist: Patient identified, Emergency Drugs available, Suction available and Patient being monitored Patient Re-evaluated:Patient Re-evaluated prior to induction Oxygen Delivery Method: Circle System Utilized Preoxygenation: Pre-oxygenation with 100% oxygen Induction Type: IV induction Ventilation: Mask ventilation without difficulty and Oral airway inserted - appropriate to patient size Laryngoscope Size: Mac and 3 Grade View: Grade I Endobronchial tube: Left, Double lumen EBT and EBT position confirmed by fiberoptic bronchoscope and 39 Fr Number of attempts: 1 Airway Equipment and Method: Stylet, Oral airway and Fiberoptic brochoscope Placement Confirmation: ETT inserted through vocal cords under direct vision, positive ETCO2 and breath sounds checked- equal and bilateral Tube secured with: Tape Dental Injury: Teeth and Oropharynx as per pre-operative assessment  Comments: Left DLT placed by CRNA. Placement confirmed by myself with fiberoptic bronchoscope. Daiva Huge, MD

## 2020-10-16 ENCOUNTER — Inpatient Hospital Stay (HOSPITAL_COMMUNITY): Payer: Medicare Other

## 2020-10-16 LAB — BASIC METABOLIC PANEL
Anion gap: 4 — ABNORMAL LOW (ref 5–15)
BUN: 15 mg/dL (ref 8–23)
CO2: 32 mmol/L (ref 22–32)
Calcium: 8.7 mg/dL — ABNORMAL LOW (ref 8.9–10.3)
Chloride: 105 mmol/L (ref 98–111)
Creatinine, Ser: 1.27 mg/dL — ABNORMAL HIGH (ref 0.61–1.24)
GFR, Estimated: >60 mL/min (ref >60)
Glucose, Bld: 150 mg/dL — ABNORMAL HIGH (ref 70–99)
Potassium: 5 mmol/L (ref 3.5–5.1)
Sodium: 141 mmol/L (ref 135–145)

## 2020-10-16 LAB — CBC
HCT: 42.9 % (ref 39.0–52.0)
Hemoglobin: 12.9 g/dL — ABNORMAL LOW (ref 13.0–17.0)
MCH: 26.7 pg (ref 26.0–34.0)
MCHC: 30.1 g/dL (ref 30.0–36.0)
MCV: 88.8 fL (ref 80.0–100.0)
Platelets: 225 10*3/uL (ref 150–400)
RBC: 4.83 MIL/uL (ref 4.22–5.81)
RDW: 15.7 % — ABNORMAL HIGH (ref 11.5–15.5)
WBC: 8.2 10*3/uL (ref 4.0–10.5)
nRBC: 0 % (ref 0.0–0.2)

## 2020-10-16 NOTE — Op Note (Signed)
NAME: Kenneth Fischer, Kenneth Fischer MEDICAL RECORD NO: [id number] ACCOUNT NO: [id number] DATE OF BIRTH: 12/11/1950 FACILITY: [facility] LOCATION: [facility] PHYSICIAN: Revonda Standard. Roxan Hockey, MD  Operative Report   DATE OF PROCEDURE: 10/15/2020  PREOPERATIVE DIAGNOSIS:  Left lower lobe lung nodule.  POSTOPERATIVE DIAGNOSIS:  Metastatic renal cell carcinoma.  PROCEDURE PERFORMED:   Xi robotic-assisted left video-assisted thoracoscopy,  Lysis of adhesions, Wedge resection of left lower lobe nodule, Lymph node sampling and pleural biopsy and Intercostal nerve blocks levels 3 through 10.  SURGEON:  Revonda Standard. Roxan Hockey, MD   ASSISTANT:  Jadene Pierini, PA-C.  ANESTHESIA:  General.  FINDINGS:  Severe adhesions.  A nodule in the anterior aspect of basilar segment of left lower lobe.  Frozen section revealed metastatic renal cell carcinoma, margin was clear, calcified lymph nodes.  CLINICAL NOTE:  Mr Meuse is a 70 year old man with a history of renal cell carcinoma, found to have an enlarging nodule in the left lower lobe.  There was uptake on PET/CT.  There was no evidence of other disease.  He was advised to undergo surgical  resection for definitive diagnosis and treatment.  The indications, risks, benefits, and alternatives were discussed in detail with the patient.  He understood and accepted the risks and agreed to proceed.  OPERATIVE NOTE:  Mr. Denoble was brought to the preoperative holding area on 10/15/2020.  Anesthesia placed an arterial blood pressure monitoring line and established intravenous access.  He was taken to the operating room, anesthetized, and intubated  with a double lumen endotracheal tube.  Intravenous antibiotics were administered.  A Foley catheter was placed.  The chest, abdomen and legs were prepped and draped in the usual sterile fashion.  Single lung ventilation of the right lung was initiated  and was tolerated well throughout the procedure.  A timeout was performed.  A solution  containing 20 mL of liposomal bupivacaine, 30 mL of 0.5% bupivacaine and 50 mL of saline was prepared.  This solution was used for the local at the incision sites as well as for the intercostal nerve blocks.  An incision was  made in the eighth interspace in the mid axillary line.  An 8 mm port was inserted.  Initially, there was poor visualization, so an incision was made 5 cm posterior to that, again through the eighth interspace. With insertion of the port site, there was  lung entry. Incision was made in the eighth interspace anterior to the camera port and a 12 mm port was inserted.  There was a free space there.  There were adhesions posteriorly, apically, to the mediastinum and to the diaphragm.  The adhesions were  taken down with blunt dissection to allow placement of the remaining ports. Once the 4 robotic ports had been placed in the eighth interspace, a 12 mm AirSeal port was placed in the tenth interspace.  Because of the adhesions, decision was made to  perform the intercostal nerve blocks at the end of the procedure. The robot was deployed.  The camera arm was docked.  Targeting was performed.  The remaining ports were connected to the robot.  The robotic instruments were inserted with thoracoscopic  visualization.  Lysis of adhesions then was performed.  This was a slow and tedious process.  Initially, the adhesions were relatively thin and filmy, particularly with the lower lobe to the posterolateral chest wall and these came down rather easily,  but more superiorly. Near the upper lobe, there were more dense adhesions, and ultimately, the apical adhesions were left  untouched.  There was some bleeding with the takedown of adhesions.  The lower lobe then was inspected along the diaphragm and there  were significant adhesions in that area.  These were taken down, ultimately freeing up the lower lobe completely inferiorly. The inferior ligament then was divided.  There was a small calcified  lymph node that was removed and sent for permanent  pathology.  Working posteriorly, additional adhesions were taken down and the hilum was inspected.  There was a level 10 node that was also calcified.  This was removed and sent for permanent pathology as well.  Attention then was turned to the fissure.   There were adhesions in the fissure.  These were taken down with bipolar cautery and blunt dissection.  Ultimately, the fissure was completed anteriorly with a stapler.  The nodule was in view in the anterior basilar segment of the lower lobe.  It was  felt that an adequate gross margin could be obtained on the lesion and it was removed with sequential firings of the robotic stapler, both green and black cartridges were used.  The specimen was placed into an endoscopic retrieval bag, removed, and sent  for frozen section of the nodule and the margins.  Frozen section ultimately returned showing the nodule was metastatic renal cell carcinoma.  There was a clear microscopic margin, although it was relatively close. Because of the severe adhesions, it was not possible to take additional margin without doing a major pulmonary resection.  The area where there were adhesions and a tear from placement of the thoracoscope and one other area that had a tear from the adhesion takedown were stapled with  the robotic stapler.  The sponges and vessel loop that had been placed during the dissection were removed except for one sponge that was removed after the robot was undocked.  The chest was copiously irrigated with saline.  There was thickened parietal  pleura around the posterior port.  This had been elevated off the chest wall with placement of that port.  This pleura was excised and sent for permanent pathology.  The robotic instruments then were removed.  Progel was applied to the areas in the  fissure where the lung was denuded with the dissection.  A 14-French pigtail catheter was placed from the anterior  approach and directed towards the apex.  A 28-French Blake drain was placed through the anterior eighth interspace incision and secured  with a #1 silk suture.  It was directed posteriorly and superiorly.  Final inspection was made.  There was no apparent ongoing bleeding.  Dual lung ventilation was resumed.  The scope was removed.  The remaining incisions were closed in standard  fashion.  A chest x-ray was performed in the operating room because of damage to the tip-up robotic instrument.  It showed no retained foreign body.  The pigtail and chest tube were placed to Pleur-Evac on waterseal.  The patient was placed back in a  supine position.  He was extubated in the operating room and taken to the [facility] in good condition.      PAA D: 10/15/2020 3:09:05 pm T: 10/16/2020 1:40:00 am  JOB: [id number]

## 2020-10-16 NOTE — Progress Notes (Signed)
1 Day Post-Op Procedure(s) (LRB): XI ROBOTIC ASSISTED THORASCOPY-WEDGE RESECTION (Left) INTERCOSTAL NERVE BLOCK (Left) NODE DISSECTION (Left) Subjective: Some soreness, no shortness of breath  Objective: Vital signs in last 24 hours: Temp:  [97 F (36.1 C)-98.6 F (37 C)] 98 F (36.7 C) (09/03 0754) Pulse Rate:  [68-82] 80 (09/03 0754) Cardiac Rhythm: Normal sinus rhythm (09/03 0700) Resp:  [14-30] 30 (09/03 0754) BP: (108-136)/(47-72) 131/64 (09/03 0754) SpO2:  [90 %-100 %] 99 % (09/03 0920) Arterial Line BP: (109-156)/(44-67) 156/67 (09/02 1350)  Hemodynamic parameters for last 24 hours:    Intake/Output from previous day: 09/02 0701 - 09/03 0700 In: 3039.6 [I.V.:2327.4; IV Piggyback:712.2] Out: 475 [Urine:225; Blood:150; Chest Tube:100] Intake/Output this shift: Total I/O In: -  Out: 550 [Urine:550]  General appearance: alert, cooperative, and no distress Neurologic: intact Heart: regular rate and rhythm Lungs: clear to auscultation bilaterally + air leak  Lab Results: Recent Labs    10/13/20 1530 10/16/20 0008  WBC 7.1 8.2  HGB 14.8 12.9*  HCT 46.9 42.9  PLT 260 225   BMET:  Recent Labs    10/13/20 1530 10/16/20 0008  NA 137 141  K 4.4 5.0  CL 100 105  CO2 27 32  GLUCOSE 104* 150*  BUN 13 15  CREATININE 0.98 1.27*  CALCIUM 9.1 8.7*    PT/INR:  Recent Labs    10/13/20 1530  LABPROT 14.8  INR 1.2   ABG    Component Value Date/Time   PHART 7.369 10/13/2020 1520   HCO3 29.7 (H) 10/13/2020 1520   O2SAT 94.5 10/13/2020 1520   CBG (last 3)  No results for input(s): GLUCAP in the last 72 hours.  Assessment/Plan: S/P Procedure(s) (LRB): XI ROBOTIC ASSISTED THORASCOPY-WEDGE RESECTION (Left) INTERCOSTAL NERVE BLOCK (Left) NODE DISSECTION (Left) POD # 1 looks good Has a small pneumothorax on CXR and an air leak on exam- will place tube to suction Pain is well controlled Up in chair, ambulate Creatinine up minimally, monitor SCD +  enoxaparin for DVT prophylaxis IS   LOS: 1 day    Kenneth Fischer 10/16/2020

## 2020-10-17 ENCOUNTER — Inpatient Hospital Stay (HOSPITAL_COMMUNITY): Payer: Medicare Other

## 2020-10-17 LAB — COMPREHENSIVE METABOLIC PANEL
ALT: 11 U/L (ref 0–44)
AST: 20 U/L (ref 15–41)
Albumin: 2.8 g/dL — ABNORMAL LOW (ref 3.5–5.0)
Alkaline Phosphatase: 100 U/L (ref 38–126)
Anion gap: 3 — ABNORMAL LOW (ref 5–15)
BUN: 16 mg/dL (ref 8–23)
CO2: 33 mmol/L — ABNORMAL HIGH (ref 22–32)
Calcium: 8.5 mg/dL — ABNORMAL LOW (ref 8.9–10.3)
Chloride: 104 mmol/L (ref 98–111)
Creatinine, Ser: 1.12 mg/dL (ref 0.61–1.24)
GFR, Estimated: >60 mL/min (ref >60)
Glucose, Bld: 111 mg/dL — ABNORMAL HIGH (ref 70–99)
Potassium: 4.9 mmol/L (ref 3.5–5.1)
Sodium: 140 mmol/L (ref 135–145)
Total Bilirubin: 0.6 mg/dL (ref 0.3–1.2)
Total Protein: 5.5 g/dL — ABNORMAL LOW (ref 6.5–8.1)

## 2020-10-17 LAB — CBC
HCT: 41 % (ref 39.0–52.0)
Hemoglobin: 12.3 g/dL — ABNORMAL LOW (ref 13.0–17.0)
MCH: 26.9 pg (ref 26.0–34.0)
MCHC: 30 g/dL (ref 30.0–36.0)
MCV: 89.5 fL (ref 80.0–100.0)
Platelets: 196 10*3/uL (ref 150–400)
RBC: 4.58 MIL/uL (ref 4.22–5.81)
RDW: 15.9 % — ABNORMAL HIGH (ref 11.5–15.5)
WBC: 8 10*3/uL (ref 4.0–10.5)
nRBC: 0 % (ref 0.0–0.2)

## 2020-10-17 MED ORDER — PANTOPRAZOLE SODIUM 40 MG PO TBEC
40.0000 mg | DELAYED_RELEASE_TABLET | Freq: Every day | ORAL | Status: DC
Start: 2020-10-17 — End: 2020-10-26
  Administered 2020-10-17 – 2020-10-26 (×10): 40 mg via ORAL
  Filled 2020-10-17 (×9): qty 1

## 2020-10-17 MED ORDER — ALBUTEROL SULFATE (2.5 MG/3ML) 0.083% IN NEBU
2.5000 mg | INHALATION_SOLUTION | RESPIRATORY_TRACT | Status: DC
  Administered 2020-10-17 – 2020-10-20 (×13): 2.5 mg via RESPIRATORY_TRACT
  Filled 2020-10-17 (×14): qty 3

## 2020-10-17 MED ORDER — GUAIFENESIN ER 600 MG PO TB12
1200.0000 mg | ORAL_TABLET | Freq: Two times a day (BID) | ORAL | Status: AC
  Administered 2020-10-17 – 2020-10-21 (×10): 1200 mg via ORAL
  Filled 2020-10-17 (×10): qty 2

## 2020-10-17 NOTE — Progress Notes (Signed)
2 Days Post-Op Procedure(s) (LRB): XI ROBOTIC ASSISTED THORASCOPY-WEDGE RESECTION (Left) INTERCOSTAL NERVE BLOCK (Left) NODE DISSECTION (Left) Subjective: C/o cough and congestion, some pain from tubes  Objective: Vital signs in last 24 hours: Temp:  [98.3 F (36.8 C)-98.9 F (37.2 C)] 98.4 F (36.9 C) (09/04 0403) Pulse Rate:  [73-97] 73 (09/04 0403) Cardiac Rhythm: Normal sinus rhythm (09/04 0700) Resp:  [20-39] 20 (09/04 0403) BP: (112-170)/(56-72) 121/67 (09/04 0403) SpO2:  [90 %-99 %] 93 % (09/04 0500)  Hemodynamic parameters for last 24 hours:    Intake/Output from previous day: 09/03 0701 - 09/04 0700 In: 360 [P.O.:360] Out: 1875 [Urine:1675; Chest Tube:200] Intake/Output this shift: No intake/output data recorded.  General appearance: alert, cooperative, and no distress Neurologic: intact Heart: regular rate and rhythm Lungs: clear to auscultation bilaterally Small air leak  Lab Results: Recent Labs    10/16/20 0008 10/17/20 0027  WBC 8.2 8.0  HGB 12.9* 12.3*  HCT 42.9 41.0  PLT 225 196   BMET:  Recent Labs    10/16/20 0008 10/17/20 0027  NA 141 140  K 5.0 4.9  CL 105 104  CO2 32 33*  GLUCOSE 150* 111*  BUN 15 16  CREATININE 1.27* 1.12  CALCIUM 8.7* 8.5*    PT/INR: No results for input(s): LABPROT, INR in the last 72 hours. ABG    Component Value Date/Time   PHART 7.369 10/13/2020 1520   HCO3 29.7 (H) 10/13/2020 1520   O2SAT 94.5 10/13/2020 1520   CBG (last 3)  No results for input(s): GLUCAP in the last 72 hours.  Assessment/Plan: S/P Procedure(s) (LRB): XI ROBOTIC ASSISTED THORASCOPY-WEDGE RESECTION (Left) INTERCOSTAL NERVE BLOCK (Left) NODE DISSECTION (Left) - POD # 2 wedge resection insetting of severe adhesions Air leak decreased- will try on water seal today May be able to remove large bore CT in next day or two and manage with pigtail catheter Will add flutter valve and give mucinex to help clear secretions Ambulate SCD +  enoxaparin for DVT prophylaxis Creatinine back to normal   LOS: 2 days    Kenneth Fischer 10/17/2020

## 2020-10-18 ENCOUNTER — Inpatient Hospital Stay (HOSPITAL_COMMUNITY): Payer: Medicare Other

## 2020-10-18 ENCOUNTER — Encounter (HOSPITAL_COMMUNITY): Payer: Self-pay | Admitting: Thoracic Surgery (Cardiothoracic Vascular Surgery)

## 2020-10-18 MED ORDER — FUROSEMIDE 10 MG/ML IJ SOLN
60.0000 mg | Freq: Four times a day (QID) | INTRAMUSCULAR | Status: AC
Start: 2020-10-18 — End: 2020-10-18
  Administered 2020-10-18 (×2): 60 mg via INTRAVENOUS
  Filled 2020-10-18 (×2): qty 6

## 2020-10-18 NOTE — Progress Notes (Addendum)
3 Days Post-Op Procedure(s) (LRB): XI ROBOTIC ASSISTED THORASCOPY-WEDGE RESECTION (Left) INTERCOSTAL NERVE BLOCK (Left) NODE DISSECTION (Left) Subjective: Says he does not feel as well today, more short of breath this morning. Respiratory rate 32, O2 sat 96% on room air  Objective: Vital signs in last 24 hours: Temp:  [97.4 F (36.3 C)-99.1 F (37.3 C)] 98.6 F (37 C) (09/05 0350) Pulse Rate:  [72-90] 79 (09/05 0817) Cardiac Rhythm: Normal sinus rhythm (09/05 0706) Resp:  [18-37] 22 (09/05 0817) BP: (124-147)/(57-80) 142/72 (09/05 0817) SpO2:  [90 %-98 %] 97 % (09/05 0817)     Intake/Output from previous day: 09/04 0701 - 09/05 0700 In: -  Out: 1300 [Urine:1100; Chest Tube:200] Intake/Output this shift: No intake/output data recorded.  General appearance: alert, cooperative, appears mildly dyspneic, respiratory rate 32 Neurologic: intact Heart: regular rate and rhythm Lungs: Breath sounds coarse with some expiratory wheezing. Chest tubes on waterseal, chest x-ray shows increasing left lateral and basilar pneumothorax.  Subcu air has changed in distribution but is about is about the same and volume. Small air leak  Lab Results: Recent Labs    10/16/20 0008 10/17/20 0027  WBC 8.2 8.0  HGB 12.9* 12.3*  HCT 42.9 41.0  PLT 225 196    BMET:  Recent Labs    10/16/20 0008 10/17/20 0027  NA 141 140  K 5.0 4.9  CL 105 104  CO2 32 33*  GLUCOSE 150* 111*  BUN 15 16  CREATININE 1.27* 1.12  CALCIUM 8.7* 8.5*     PT/INR: No results for input(s): LABPROT, INR in the last 72 hours. ABG    Component Value Date/Time   PHART 7.369 10/13/2020 1520   HCO3 29.7 (H) 10/13/2020 1520   O2SAT 94.5 10/13/2020 1520   CBG (last 3)  No results for input(s): GLUCAP in the last 72 hours.  CLINICAL DATA:  Status post partial left lobectomy 10/15/2020   EXAM: PORTABLE CHEST 1 VIEW   COMPARISON:  Chest radiograph from one day prior.   FINDINGS: Two stable left chest  tubes. Suture lines overlie the left lung base. Right rotated chest radiograph. Stable cardiomediastinal silhouette with normal heart size. No right pneumothorax. Small to moderate ex vacuo left pneumothorax, probably unchanged accounting for differences in patient positioning. No pleural effusion. Similar subcutaneous emphysema in the lateral left chest wall. Patchy streaky opacities and parenchymal distortion throughout both lungs, unchanged.   IMPRESSION: 1. Small to moderate ex vacuo left pneumothorax, probably unchanged accounting for differences in patient positioning. Stable left chest tubes. 2. Stable patchy streaky opacities and parenchymal distortion throughout both lungs compatible with a combination of scarring and atelectasis.     Electronically Signed   By: Ilona Sorrel M.D.   On: 10/18/2020 07:56    Assessment/Plan: S/P Procedure(s) (LRB): XI ROBOTIC ASSISTED THORASCOPY-WEDGE RESECTION (Left) INTERCOSTAL NERVE BLOCK (Left) NODE DISSECTION (Left) - POD # 3wedge resection insetting of severe adhesions Air leak persists.  Small to moderate left pneumothorax developed overnight with chest tube on waterseal.  He was placed back to suction, large volume of air was evacuated in Kenneth Fischer felt he was able to breathe easier.   Need to continue working on pulmonary hygiene with flutter valve, incentive spirometry, and Mucinex.  We will also give a couple doses of IV Lasix today. Ambulate SCD + enoxaparin for DVT prophylaxis Creatinine back to normal   LOS: 3 days    Antony Odea, PA-C [phone] 10/18/2020  Agree with above. Dyspneic overnight.  Chest  tube placed suction.  Patient is more comfortable today. Patient continues to have an air leak.  Continue pulmonary hygiene. Will remove Foley.  Kenneth Fischer

## 2020-10-18 NOTE — Anesthesia Postprocedure Evaluation (Signed)
Anesthesia Post Note  Patient: Kenneth Fischer  Procedure(s) Performed: XI ROBOTIC ASSISTED THORASCOPY-WEDGE RESECTION (Left: Chest) INTERCOSTAL NERVE BLOCK (Left: Chest) NODE DISSECTION (Left: Chest)     Patient location during evaluation: PACU Anesthesia Type: General Level of consciousness: awake and alert and oriented Pain management: pain level controlled Vital Signs Assessment: post-procedure vital signs reviewed and stable Respiratory status: spontaneous breathing, nonlabored ventilation and respiratory function stable Cardiovascular status: blood pressure returned to baseline Postop Assessment: no apparent nausea or vomiting Anesthetic complications: no   No notable events documented.      Marthenia Rolling

## 2020-10-19 ENCOUNTER — Inpatient Hospital Stay (HOSPITAL_COMMUNITY): Payer: Medicare Other

## 2020-10-19 LAB — CBC
HCT: 40.9 % (ref 39.0–52.0)
Hemoglobin: 12.3 g/dL — ABNORMAL LOW (ref 13.0–17.0)
MCH: 26.7 pg (ref 26.0–34.0)
MCHC: 30.1 g/dL (ref 30.0–36.0)
MCV: 88.7 fL (ref 80.0–100.0)
Platelets: 187 K/uL (ref 150–400)
RBC: 4.61 MIL/uL (ref 4.22–5.81)
RDW: 15.3 % (ref 11.5–15.5)
WBC: 6.7 K/uL (ref 4.0–10.5)
nRBC: 0 % (ref 0.0–0.2)

## 2020-10-19 LAB — BASIC METABOLIC PANEL WITH GFR
Anion gap: 4 — ABNORMAL LOW (ref 5–15)
BUN: 20 mg/dL (ref 8–23)
CO2: 37 mmol/L — ABNORMAL HIGH (ref 22–32)
Calcium: 9 mg/dL (ref 8.9–10.3)
Chloride: 99 mmol/L (ref 98–111)
Creatinine, Ser: 1.03 mg/dL (ref 0.61–1.24)
GFR, Estimated: >60 mL/min
Glucose, Bld: 99 mg/dL (ref 70–99)
Potassium: 4.5 mmol/L (ref 3.5–5.1)
Sodium: 140 mmol/L (ref 135–145)

## 2020-10-19 LAB — SURGICAL PATHOLOGY

## 2020-10-19 MED ORDER — APIXABAN 5 MG PO TABS
5.0000 mg | ORAL_TABLET | Freq: Two times a day (BID) | ORAL | Status: DC
  Administered 2020-10-19 – 2020-10-26 (×15): 5 mg via ORAL
  Filled 2020-10-19 (×15): qty 1

## 2020-10-19 MED ORDER — FUROSEMIDE 20 MG PO TABS
20.0000 mg | ORAL_TABLET | Freq: Every day | ORAL | Status: DC
  Administered 2020-10-19 – 2020-10-26 (×8): 20 mg via ORAL
  Filled 2020-10-19 (×8): qty 1

## 2020-10-19 NOTE — Plan of Care (Signed)
  Problem: Education: Goal: Knowledge of General Education information will improve Description: Including pain rating scale, medication(s)/side effects and non-pharmacologic comfort measures Outcome: Progressing   Problem: Activity: Goal: Risk for activity intolerance will decrease Outcome: Progressing   Problem: Nutrition: Goal: Adequate nutrition will be maintained Outcome: Progressing   Problem: Pain Managment: Goal: General experience of comfort will improve Outcome: Progressing   Problem: Activity: Goal: Risk for activity intolerance will decrease Outcome: Progressing   Problem: Coping: Goal: Level of anxiety will decrease Outcome: Not Applicable

## 2020-10-19 NOTE — Progress Notes (Signed)
   10/19/20 1928  Assess: MEWS Score  Temp 98.1 F (36.7 C)  BP 136/70  Pulse Rate 72  ECG Heart Rate 72  Resp (!) 28  Level of Consciousness Alert  SpO2 96 %  O2 Device Nasal Cannula  Assess: MEWS Score  MEWS Temp 0  MEWS Systolic 0  MEWS Pulse 0  MEWS RR 2  MEWS LOC 0  MEWS Score 2  MEWS Score Color Yellow  Assess: if the MEWS score is Yellow or Red  Were vital signs taken at a resting state? Yes  Focused Assessment No change from prior assessment  Early Detection of Sepsis Score *See Row Information* Low  MEWS guidelines implemented *See Row Information* No, vital signs rechecked  Treat  MEWS Interventions Administered scheduled meds/treatments  Pain Scale 0-10  Pain Score 4  Pain Type Surgical pain  Pain Location Chest  Pain Orientation Left  Pain Intervention(s) Repositioned  Notify: Charge Nurse/RN  Name of Charge Nurse/RN Notified Jessica, RN  Date Charge Nurse/RN Notified 10/19/20  Time Charge Nurse/RN Notified 2000

## 2020-10-19 NOTE — Progress Notes (Addendum)
      [address]       [place], [address]             [phone]       4 Days Post-Op Procedure(s) (LRB): XI ROBOTIC ASSISTED THORASCOPY-WEDGE RESECTION (Left) INTERCOSTAL NERVE BLOCK (Left) NODE DISSECTION (Left)  Subjective: Patient asking for assistance to lie back in bed. He states his breathing is ok  Objective: Vital signs in last 24 hours: Temp:  [97.9 F (36.6 C)-98.2 F (36.8 C)] 98 F (36.7 C) (09/06 0420) Pulse Rate:  [58-84] 64 (09/06 0426) Cardiac Rhythm: Normal sinus rhythm (09/06 0400) Resp:  [16-31] 24 (09/06 0426) BP: (116-143)/(68-73) 131/71 (09/06 0420) SpO2:  [93 %-98 %] 94 % (09/06 0440) FiO2 (%):  [32 %] 32 % (09/05 1521)    Intake/Output from previous day: 09/05 0701 - 09/06 0700 In: -  Out: 1910 [Urine:1750; Chest Tube:160]   Physical Exam:  Cardiovascular: RRR Pulmonary: Coarse breath sounds, expiratory wheezing Abdomen: Soft, non tender, bowel sounds present. Extremities: No lower extremity edema. Wounds: Clean and dry.  No erythema or signs of infection. Chest Tube: to water seal, small + 1 air leak  Lab Results: CBC: Recent Labs    10/17/20 0027 10/19/20 0035  WBC 8.0 6.7  HGB 12.3* 12.3*  HCT 41.0 40.9  PLT 196 187   BMET:  Recent Labs    10/17/20 0027 10/19/20 0035  NA 140 140  K 4.9 4.5  CL 104 99  CO2 33* 37*  GLUCOSE 111* 99  BUN 16 20  CREATININE 1.12 1.03  CALCIUM 8.5* 9.0    PT/INR: No results for input(s): LABPROT, INR in the last 72 hours. ABG:  INR: Will add last result for INR, ABG once components are confirmed Will add last 4 CBG results once components are confirmed  Assessment/Plan:  1. CV - SR. On Coreg 12.5 mg bid and Entresto bid. 2.  Pulmonary - History of COPD. On room air this am. Chest tube is to water seal. There is a +1 air leak. CXR this am appears stable (subcutaneous emphysema left lateral chest wall, small left lateral pneumothorax). Encourage incentive  spirometer, flutter valve. Mucinex bid. Check CXR in am. Hope to remove chest tube soon. Await final pathology 3. Encouraged ambulation  Donielle M ZimmermanPA-C 10/19/2020,6:56 AM [phone]   Patient seen and examined, agree with above CXR improved with CT on suction Still has an air leak but is decreased Will restart Eliquis as he has a history of PE, stop enoxaparin Final path pending  Remo Lipps C. Roxan Hockey, MD [facility] [phone]

## 2020-10-20 ENCOUNTER — Inpatient Hospital Stay (HOSPITAL_COMMUNITY): Payer: Medicare Other

## 2020-10-20 MED ORDER — IPRATROPIUM-ALBUTEROL 0.5-2.5 (3) MG/3ML IN SOLN
3.0000 mL | RESPIRATORY_TRACT | Status: DC | PRN
  Administered 2020-10-21 – 2020-10-25 (×5): 3 mL via RESPIRATORY_TRACT
  Filled 2020-10-20 (×6): qty 3

## 2020-10-20 NOTE — Care Management Important Message (Signed)
Important Message  Patient Details  Name: Kenneth Fischer MRN: [id number] Date of Birth: 26-Jan-1951   Medicare Important Message Given:  Yes     [name] 10/20/2020, 3:59 PM

## 2020-10-20 NOTE — TOC Initial Note (Signed)
Transition of Care [facility]) - Initial/Assessment Note    Patient Details  Name: Kenneth Fischer MRN: [id number] Date of Birth: 11-20-1950  Transition of Care [facility] CM/SW Contact:    Zenon Mayo, RN Phone Number: 10/20/2020, 8:47 AM  Clinical Narrative:                 from home, s/p robotic ast Vats, chest tube drain in.has small air leak, TOC will continue to follow for dc needs.        Patient Goals and CMS Choice        Expected Discharge Plan and Services                                                Prior Living Arrangements/Services                       Activities of Daily Living Home Assistive Devices/Equipment: Blood pressure cuff ADL Screening (condition at time of admission) Patient's cognitive ability adequate to safely complete daily activities?: Yes Is the patient deaf or have difficulty hearing?: No Does the patient have difficulty seeing, even when wearing glasses/contacts?: No Does the patient have difficulty concentrating, remembering, or making decisions?: Yes Patient able to express need for assistance with ADLs?: Yes Does the patient have difficulty dressing or bathing?: No Independently performs ADLs?: Yes (appropriate for developmental age) Does the patient have difficulty walking or climbing stairs?: No Weakness of Legs: None Weakness of Arms/Hands: None  Permission Sought/Granted                  Emotional Assessment              Admission diagnosis:  Mass of left lung [R91.8] Patient Active Problem List   Diagnosis Date Noted   Mass of left lung 10/15/2020   Neoplasm of left kidney 02/21/2019   Renal mass 02/20/2019   Former smoker 01/07/2019   CKD (chronic kidney disease), stage III ([place]) 05/24/2017   CHF (congestive heart failure) ([place]) 04/09/2017   COPD (chronic obstructive pulmonary disease) ([place]) 04/09/2017   Pulmonary embolism ([place]) 04/09/2017   History of renal stone 05/10/2015   HTN  (hypertension), benign 05/10/2015   PCP:  [name], MD Pharmacy:   [facility] [id number] - [place], [address] [place] [address] Phone: [phone] Fax: [phone]     Social Determinants of Health (SDOH) Interventions    Readmission Risk Interventions No flowsheet data found.

## 2020-10-20 NOTE — Progress Notes (Addendum)
      [address]       [place] [address]             [phone]      5 Days Post-Op Procedure(s) (LRB): XI ROBOTIC ASSISTED THORASCOPY-WEDGE RESECTION (Left) INTERCOSTAL NERVE BLOCK (Left) NODE DISSECTION (Left) Subjective: He walked some in the halls yesterday and had some shortness of breath. He wants to go home.   Objective: Vital signs in last 24 hours: Temp:  [97.8 F (36.6 C)-98.2 F (36.8 C)] 97.8 F (36.6 C) (09/07 0313) Pulse Rate:  [62-74] 64 (09/07 0313) Cardiac Rhythm: Normal sinus rhythm (09/07 0000) Resp:  [22-32] 32 (09/07 0313) BP: (135-144)/(61-86) 138/79 (09/07 0313) SpO2:  [93 %-97 %] 96 % (09/07 0339)  Hemodynamic parameters for last 24 hours:    Intake/Output from previous day: 09/06 0701 - 09/07 0700 In: 180 [P.O.:180] Out: 905 [Urine:750; Chest Tube:155] Intake/Output this shift: No intake/output data recorded.  General appearance: alert, cooperative, and no distress Heart: irregularly irregular rhythm Lungs: clear to auscultation bilaterally Abdomen: soft, non-tender; bowel sounds normal; no masses,  no organomegaly Extremities: extremities normal, atraumatic, no cyanosis or edema Wound: clean and dry  Lab Results: Recent Labs    10/19/20 0035  WBC 6.7  HGB 12.3*  HCT 40.9  PLT 187   BMET:  Recent Labs    10/19/20 0035  NA 140  K 4.5  CL 99  CO2 37*  GLUCOSE 99  BUN 20  CREATININE 1.03  CALCIUM 9.0    PT/INR: No results for input(s): LABPROT, INR in the last 72 hours. ABG    Component Value Date/Time   PHART 7.369 10/13/2020 1520   HCO3 29.7 (H) 10/13/2020 1520   O2SAT 94.5 10/13/2020 1520   CBG (last 3)  No results for input(s): GLUCAP in the last 72 hours.  Assessment/Plan: S/P Procedure(s) (LRB): XI ROBOTIC ASSISTED THORASCOPY-WEDGE RESECTION (Left) INTERCOSTAL NERVE BLOCK (Left) NODE DISSECTION (Left)  CV- irregular rhythm, seems to be new for him, will order an EKG. Continue coreg and  Entresto Pulm-Hx COPD, tolerating 2L [place] with good oxygen saturation. CXR stable from yesterday with continue subq emphysema. 1+ air leak, CT is on suction.  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 Margins negative for carcinoma.   Plan: Will f/u on EKG. Hopeful for chest tube removal soon. Dr. Roxan Hockey to discuss pathology with the patient today. Weaning oxygen as able. Continue to ambulate in the halls and encourage flutter valve/IS    LOS: 5 days    Elgie Collard 10/20/2020  Patient seen and examined, agree with above Still has an air leak, remains relatively small- will try on water seal again today Continue ambulation  Remo Lipps C. Roxan Hockey, MD [facility] [phone]

## 2020-10-21 ENCOUNTER — Inpatient Hospital Stay (HOSPITAL_COMMUNITY): Payer: Medicare Other

## 2020-10-21 NOTE — Progress Notes (Signed)
Patient called RN into room shortly after CXR completed. Patient c/o of left chest wall pain. EKG preformed per protocol. Patient states pain is 8/10. Achy and progressive. Fentanyl 70mg administered per order.

## 2020-10-21 NOTE — Progress Notes (Signed)
Pt reassessed for pain. States that pain is now gone. Patient sitting up, eating breakfast.

## 2020-10-21 NOTE — Progress Notes (Addendum)
      [address]       [place] [address]             [phone]      6 Days Post-Op Procedure(s) (LRB): XI ROBOTIC ASSISTED THORASCOPY-WEDGE RESECTION (Left) INTERCOSTAL NERVE BLOCK (Left) NODE DISSECTION (Left) Subjective: Pain this morning after his CXR, resolved with IV Fentanyl. Seems to be comfortable now however, he does seem more short of breath this morning.   Objective: Vital signs in last 24 hours: Temp:  [97.6 F (36.4 C)-98.5 F (36.9 C)] 97.9 F (36.6 C) (09/08 0716) Pulse Rate:  [61-73] 73 (09/08 0716) Cardiac Rhythm: Normal sinus rhythm (09/07 1905) Resp:  [16-29] 29 (09/08 0716) BP: (126-158)/(64-127) 158/87 (09/08 0716) SpO2:  [92 %-97 %] 92 % (09/08 0716)     Intake/Output from previous day: 09/07 0701 - 09/08 0700 In: 720 [P.O.:720] Out: 1330 [Urine:1100; Chest Tube:230] Intake/Output this shift: No intake/output data recorded.  General appearance: alert, cooperative, and no distress Heart: irregularly irregular rhythm Lungs: clear to auscultation bilaterally Abdomen: soft, non-tender; bowel sounds normal; no masses,  no organomegaly Extremities: extremities normal, atraumatic, no cyanosis or edema Wound: clean and dry  Lab Results: Recent Labs    10/19/20 0035  WBC 6.7  HGB 12.3*  HCT 40.9  PLT 187   BMET:  Recent Labs    10/19/20 0035  NA 140  K 4.5  CL 99  CO2 37*  GLUCOSE 99  BUN 20  CREATININE 1.03  CALCIUM 9.0    PT/INR: No results for input(s): LABPROT, INR in the last 72 hours. ABG    Component Value Date/Time   PHART 7.369 10/13/2020 1520   HCO3 29.7 (H) 10/13/2020 1520   O2SAT 94.5 10/13/2020 1520   CBG (last 3)  No results for input(s): GLUCAP in the last 72 hours.  Assessment/Plan: S/P Procedure(s) (LRB): XI ROBOTIC ASSISTED THORASCOPY-WEDGE RESECTION (Left) INTERCOSTAL NERVE BLOCK (Left) NODE DISSECTION (Left)  CV- irregular rhythm, rate is 80s-and hit 150bpm, EKG done this morning  shows NSR with PVCs.  Continue Coreg and Entresto Pulm-Hx COPD, tolerating 2L [employer] with good oxygen saturation. CXR stable from yesterday with continue subq emphysema. Residual pneumo slightly larger. CT is on WS. 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 Margins negative for carcinoma.    Plan: Watch HR closely. Breathing treatment this morning, tachypnea. Using IS but not well. Given some instructions this morning on proper use. Has a 4+ air leak on water seal. May need a mini express.    LOS: 6 days    Elgie Collard 10/21/2020 Patient seen and examined, agree with above Not tachypnic at present No pain at present Still has air leak, will try to leave tubes on water seal  Remo Lipps C. Roxan Hockey, MD [facility] [phone]

## 2020-10-22 ENCOUNTER — Inpatient Hospital Stay (HOSPITAL_COMMUNITY): Payer: Medicare Other

## 2020-10-22 LAB — CBC
HCT: 41.9 % (ref 39.0–52.0)
Hemoglobin: 12.6 g/dL — ABNORMAL LOW (ref 13.0–17.0)
MCH: 26.2 pg (ref 26.0–34.0)
MCHC: 30.1 g/dL (ref 30.0–36.0)
MCV: 87.1 fL (ref 80.0–100.0)
Platelets: 229 10*3/uL (ref 150–400)
RBC: 4.81 MIL/uL (ref 4.22–5.81)
RDW: 15.2 % (ref 11.5–15.5)
WBC: 5.8 10*3/uL (ref 4.0–10.5)
nRBC: 0 % (ref 0.0–0.2)

## 2020-10-22 LAB — BASIC METABOLIC PANEL
Anion gap: 6 (ref 5–15)
BUN: 18 mg/dL (ref 8–23)
CO2: 36 mmol/L — ABNORMAL HIGH (ref 22–32)
Calcium: 9.4 mg/dL (ref 8.9–10.3)
Chloride: 97 mmol/L — ABNORMAL LOW (ref 98–111)
Creatinine, Ser: 0.82 mg/dL (ref 0.61–1.24)
GFR, Estimated: >60 mL/min (ref >60)
Glucose, Bld: 93 mg/dL (ref 70–99)
Potassium: 4.3 mmol/L (ref 3.5–5.1)
Sodium: 139 mmol/L (ref 135–145)

## 2020-10-22 MED ORDER — ORAL CARE MOUTH RINSE
15.0000 mL | Freq: Two times a day (BID) | OROMUCOSAL | Status: DC
  Administered 2020-10-22 – 2020-10-26 (×8): 15 mL via OROMUCOSAL

## 2020-10-22 NOTE — Progress Notes (Signed)
      [address]       [place] [address]             [phone]      7 Days Post-Op Procedure(s) (LRB): XI ROBOTIC ASSISTED THORASCOPY-WEDGE RESECTION (Left) INTERCOSTAL NERVE BLOCK (Left) NODE DISSECTION (Left) Subjective: Feels okay this morning, did not walk at all yesterday but will try to walk today. He was up in the chair most of the day yesterday.   Objective: Vital signs in last 24 hours: Temp:  [97.6 F (36.4 C)-98.6 F (37 C)] 97.8 F (36.6 C) (09/09 0345) Pulse Rate:  [60-76] 70 (09/09 0345) Cardiac Rhythm: Normal sinus rhythm (09/09 0710) Resp:  [22-34] 22 (09/09 0345) BP: (134-151)/(68-90) 137/90 (09/09 0345) SpO2:  [92 %-97 %] 96 % (09/09 0345)     Intake/Output from previous day: 09/08 0701 - 09/09 0700 In: 720 [P.O.:720] Out: 1570 [Urine:1450; Chest Tube:120] Intake/Output this shift: No intake/output data recorded.  General appearance: alert, cooperative, and no distress Heart: regular rate and rhythm, S1, S2 normal, no murmur, click, rub or gallop Lungs: clear to auscultation bilaterally Abdomen: soft, non-tender; bowel sounds normal; no masses,  no organomegaly Extremities: extremities normal, atraumatic, no cyanosis or edema Wound: clean and dry  Lab Results: Recent Labs    10/22/20 0045  WBC 5.8  HGB 12.6*  HCT 41.9  PLT 229   BMET:  Recent Labs    10/22/20 0045  NA 139  K 4.3  CL 97*  CO2 36*  GLUCOSE 93  BUN 18  CREATININE 0.82  CALCIUM 9.4    PT/INR: No results for input(s): LABPROT, INR in the last 72 hours. ABG    Component Value Date/Time   PHART 7.369 10/13/2020 1520   HCO3 29.7 (H) 10/13/2020 1520   O2SAT 94.5 10/13/2020 1520   CBG (last 3)  No results for input(s): GLUCAP in the last 72 hours.  Assessment/Plan: S/P Procedure(s) (LRB): XI ROBOTIC ASSISTED THORASCOPY-WEDGE RESECTION (Left) INTERCOSTAL NERVE BLOCK (Left) NODE DISSECTION (Left)  CV- NSR in the 70s, BP well controlled.  Continue Coreg and Entresto Pulm-Hx COPD, tolerating 2L [place] with good oxygen saturation. CXR stable from yesterday with continue subq emphysema. Residual pneumo. CT is on WS. 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 Margins negative for carcinoma.  Plan: We will try his chest tubes to a mini express today to assist with mobility, continued 1+ air leak with cough on water seal. Encouraged ambulation around the unit and use of incentive spirometer.    LOS: 7 days    Elgie Collard 10/22/2020

## 2020-10-22 NOTE — TOC Progression Note (Addendum)
hTransition of Care [facility]) - Progression Note    Patient Details  Name: Kaydon Wittler MRN: [id number] Date of Birth: [birth year]  Transition of Care [facility]) CM/SW Contact  Zenon Mayo, RN Phone Number: 10/22/2020, 5:18 PM  Clinical Narrative:    NCM spoke with patient , offered choice, he is ok with [facility] for [facility] for mini express chest tube,  and also [employer] for home oxygen.  He states he would like for me to let his brother know as well.  NCM made referral to [facility] with [facility] for [facility], she states she can take with soc date Monday or Tuesday.  NCM made referral to [facility] with [employer] for the home oxygen.  Will need to follow up with [facility] and [employer] to let know when patient is being discharged. NCM spoke with patient 's brother and informed him of the [facility] from [place] will supply the oxygen.  The Staff RN should educate patient and family on the drainage of the mini express and send a syringe home with the patient.        Expected Discharge Plan and Services                                                 Social Determinants of Health (SDOH) Interventions    Readmission Risk Interventions No flowsheet data found.

## 2020-10-22 NOTE — Progress Notes (Signed)
Pt switched to mini express chest drain. Pt educated. Tolerated well. Jarrett Soho, RN

## 2020-10-23 ENCOUNTER — Inpatient Hospital Stay (HOSPITAL_COMMUNITY): Payer: Medicare Other

## 2020-10-23 MED ORDER — TRAMADOL HCL 50 MG PO TABS: 50.0000 mg | ORAL_TABLET | Freq: Four times a day (QID) | ORAL | 0 refills | Status: AC | PRN

## 2020-10-23 NOTE — Progress Notes (Addendum)
      [address]       [place] [address]             [phone]      8 Days Post-Op Procedure(s) (LRB): XI ROBOTIC ASSISTED THORASCOPY-WEDGE RESECTION (Left) INTERCOSTAL NERVE BLOCK (Left) NODE DISSECTION (Left)  Subjective:  Patient without new complaints.  Has to use the bathroom.  Objective: Vital signs in last 24 hours: Temp:  [97.7 F (36.5 C)-98.2 F (36.8 C)] 97.7 F (36.5 C) (09/10 0800) Pulse Rate:  [62-77] 71 (09/10 0800) Cardiac Rhythm: Normal sinus rhythm;Bundle branch block (09/10 0731) Resp:  [20-30] 20 (09/10 0800) BP: (125-141)/(56-86) 125/56 (09/10 0800) SpO2:  [93 %-96 %] 95 % (09/10 0800)  Intake/Output from previous day: 09/09 0701 - 09/10 0700 In: -  Out: 725 [Urine:625; Chest Tube:100] Intake/Output this shift: Total I/O In: 240 [P.O.:240] Out: 300 [Urine:300]  General appearance: alert, cooperative, and no distress Heart: regular rate and rhythm Lungs: clear to auscultation bilaterally Abdomen: soft, non-tender; bowel sounds normal; no masses,  no organomegaly Wound: clean and dry  Lab Results: Recent Labs    10/22/20 0045  WBC 5.8  HGB 12.6*  HCT 41.9  PLT 229   BMET:  Recent Labs    10/22/20 0045  NA 139  K 4.3  CL 97*  CO2 36*  GLUCOSE 93  BUN 18  CREATININE 0.82  CALCIUM 9.4    PT/INR: No results for input(s): LABPROT, INR in the last 72 hours. ABG    Component Value Date/Time   PHART 7.369 10/13/2020 1520   HCO3 29.7 (H) 10/13/2020 1520   O2SAT 94.5 10/13/2020 1520   CBG (last 3)  No results for input(s): GLUCAP in the last 72 hours.  Assessment/Plan: S/P Procedure(s) (LRB): XI ROBOTIC ASSISTED THORASCOPY-WEDGE RESECTION (Left) INTERCOSTAL NERVE BLOCK (Left) NODE DISSECTION (Left)  CV- hemodynamically stable in NSR on home Coreg, Entresto Pulm- severe COPD, home oxygen has been arranged, CT to Mini Express... pneumothorax remains stable, improvement in pleural air.. no air leak  present H/O PE on Eliquis Dispo- patient stable, converted to Mini Express with stable pneumothorax improvement of air... no air leak appreciated, home oxygen, RN arranged, discussed with Dr. Cyndia Bent plan is to keep CT to water seal over the weekend, not quite ready for d/c   LOS: 8 days    Ellwood Handler, PA-C 10/23/2020   Chart reviewed, patient examined, agree with above. No air leak seen this am. Keep chest tube to Mini Express and [facility] will reevaluate Monday.

## 2020-10-23 NOTE — Progress Notes (Signed)
SATURATION QUALIFICATIONS: (This note is used to comply with regulatory documentation for home oxygen)  Patient Saturations on Room Air at Rest = 87%  Patient Saturations on Room Air while Ambulating = 77%  Patient Saturations on 2 Liters of oxygen while Ambulating = 88%  Please briefly explain why patient needs home oxygen:  Pt becomes distress walking without oxygen and saturation drops to 77, he needs oxygen for home.  Palma Holter, RN

## 2020-10-24 ENCOUNTER — Inpatient Hospital Stay (HOSPITAL_COMMUNITY): Payer: Medicare Other

## 2020-10-24 NOTE — Progress Notes (Signed)
Pt ambulated in a hallway without distress and chest pain almost 271f. Pt said he is feeling better than before. Chest tube site dressing is done and output recorded, pt denies pain, will continue to monitor the patient  RPalma Holter RN

## 2020-10-24 NOTE — Progress Notes (Addendum)
      [address]       [place] [address]             [phone]      9 Days Post-Op Procedure(s) (LRB): XI ROBOTIC ASSISTED THORASCOPY-WEDGE RESECTION (Left) INTERCOSTAL NERVE BLOCK (Left) NODE DISSECTION (Left)  Subjective:  Patient had a rough night he felt like he couldn't catch his breath.    Objective: Vital signs in last 24 hours: Temp:  [97.6 F (36.4 C)-97.9 F (36.6 C)] 97.8 F (36.6 C) (09/11 0732) Pulse Rate:  [57-71] 71 (09/11 0732) Cardiac Rhythm: Normal sinus rhythm (09/11 0700) Resp:  [18-26] 22 (09/11 0732) BP: (113-140)/(53-75) 127/53 (09/11 0732) SpO2:  [95 %-100 %] 100 % (09/11 0948) FiO2 (%):  [28 %] 28 % (09/11 0948)  Intake/Output from previous day: 09/10 0701 - 09/11 0700 In: 960 [P.O.:960] Out: 1200 [Urine:1100; Chest Tube:100] Intake/Output this shift: Total I/O In: 240 [P.O.:240] Out: 200 [Urine:200]  General appearance: alert, cooperative, and no distress Heart: regular rate and rhythm Lungs: clear to auscultation bilaterally Abdomen: soft, non-tender; bowel sounds normal; no masses,  no organomegaly Extremities: extremities normal, atraumatic, no cyanosis or edema Wound: clean and dry  Lab Results: Recent Labs    10/22/20 0045  WBC 5.8  HGB 12.6*  HCT 41.9  PLT 229   BMET:  Recent Labs    10/22/20 0045  NA 139  K 4.3  CL 97*  CO2 36*  GLUCOSE 93  BUN 18  CREATININE 0.82  CALCIUM 9.4    PT/INR: No results for input(s): LABPROT, INR in the last 72 hours. ABG    Component Value Date/Time   PHART 7.369 10/13/2020 1520   HCO3 29.7 (H) 10/13/2020 1520   O2SAT 94.5 10/13/2020 1520   CBG (last 3)  No results for input(s): GLUCAP in the last 72 hours.  Assessment/Plan: S/P Procedure(s) (LRB): XI ROBOTIC ASSISTED THORASCOPY-WEDGE RESECTION (Left) INTERCOSTAL NERVE BLOCK (Left) NODE DISSECTION (Left)  CV- NSR on Coreg and Entresto Pulm- no air leak present, connected to Mini Express  pneumothorax improved pneumothorax, continue nebs prn H/O of PE on Eliquis Dispo- patient stable, improvement of pneumothorax, no air leak present, nebs prn, home oxygen has been delivered.   Repeat CXR in AM   LOS: 9 days    Ellwood Handler, PA-C 10/24/2020   Chart reviewed, patient examined, agree with above. CXR looks fine and no air leak on mini express. Will see what Dr. Roxan Hockey wants to do with his tube tomorrow.

## 2020-10-25 ENCOUNTER — Inpatient Hospital Stay (HOSPITAL_COMMUNITY): Payer: Medicare Other

## 2020-10-25 MED ORDER — IPRATROPIUM-ALBUTEROL 0.5-2.5 (3) MG/3ML IN SOLN
3.0000 mL | Freq: Four times a day (QID) | RESPIRATORY_TRACT | Status: DC
  Administered 2020-10-25 – 2020-10-26 (×6): 3 mL via RESPIRATORY_TRACT
  Filled 2020-10-25 (×6): qty 3

## 2020-10-25 NOTE — Plan of Care (Signed)
  Problem: Health Behavior/Discharge Planning: Goal: Ability to manage health-related needs will improve Outcome: Progressing   Problem: Clinical Measurements: Goal: Will remain free from infection Outcome: Progressing   Problem: Clinical Measurements: Goal: Respiratory complications will improve Outcome: Progressing   Problem: Education: Goal: Knowledge of disease or condition will improve Outcome: Progressing

## 2020-10-25 NOTE — Progress Notes (Addendum)
      [address]       [place] [address]             [phone]      10 Days Post-Op Procedure(s) (LRB): XI ROBOTIC ASSISTED THORASCOPY-WEDGE RESECTION (Left) INTERCOSTAL NERVE BLOCK (Left) NODE DISSECTION (Left) Subjective: Feels okay this morning, he is currently getting his breathing treatment.   Objective: Vital signs in last 24 hours: Temp:  [97.5 F (36.4 C)-98.1 F (36.7 C)] 97.8 F (36.6 C) (09/12 0322) Pulse Rate:  [56-74] 72 (09/12 0400) Cardiac Rhythm: Normal sinus rhythm (09/11 2100) Resp:  [20-31] 22 (09/12 0400) BP: (113-142)/(53-78) 113/62 (09/12 0322) SpO2:  [92 %-100 %] 93 % (09/12 0400) FiO2 (%):  [28 %] 28 % (09/11 0948)    Intake/Output from previous day: 09/11 0701 - 09/12 0700 In: 720 [P.O.:720] Out: 905 [Urine:775; Chest Tube:130] Intake/Output this shift: No intake/output data recorded.  General appearance: alert, cooperative, and no distress Heart: irregular rhythm, no murmur Lungs: clear to auscultation bilaterally Abdomen: soft, non-tender; bowel sounds normal; no masses,  no organomegaly Extremities: extremities normal, atraumatic, no cyanosis or edema Wound: clean and dry  Lab Results: No results for input(s): WBC, HGB, HCT, PLT in the last 72 hours. BMET: No results for input(s): NA, K, CL, CO2, GLUCOSE, BUN, CREATININE, CALCIUM in the last 72 hours.  PT/INR: No results for input(s): LABPROT, INR in the last 72 hours. ABG    Component Value Date/Time   PHART 7.369 10/13/2020 1520   HCO3 29.7 (H) 10/13/2020 1520   O2SAT 94.5 10/13/2020 1520   CBG (last 3)  No results for input(s): GLUCAP in the last 72 hours.  Assessment/Plan: S/P Procedure(s) (LRB): XI ROBOTIC ASSISTED THORASCOPY-WEDGE RESECTION (Left) INTERCOSTAL NERVE BLOCK (Left) NODE DISSECTION (Left)  CV- NSR on Coreg and Entresto Pulm- no air leak present, connected to Mini Express, lateral pneumo,  continue nebs prn, home oxygen is in the room.   H/O of PE on Eliquis Dispo- patient stable, improvement of pneumothorax, no air leak present, nebs prn, home oxygen has been delivered.   Will discuss timing of discharge.    LOS: 10 days    Elgie Collard 10/25/2020  Patient seen and examined, agree with above Will dc Blake drain this AM, leave pigtail in another day Dc [place]. Roxan Hockey, MD [facility] [phone]

## 2020-10-25 NOTE — Plan of Care (Signed)
  Problem: Education: Goal: Knowledge of General Education information will improve Description: Including pain rating scale, medication(s)/side effects and non-pharmacologic comfort measures Outcome: Progressing   Problem: Health Behavior/Discharge Planning: Goal: Ability to manage health-related needs will improve Outcome: Progressing   Problem: Clinical Measurements: Goal: Ability to maintain clinical measurements within normal limits will improve Outcome: Progressing Goal: Will remain free from infection Outcome: Progressing Goal: Diagnostic test results will improve Outcome: Progressing Goal: Respiratory complications will improve Outcome: Progressing Goal: Cardiovascular complication will be avoided Outcome: Progressing   Problem: Activity: Goal: Risk for activity intolerance will decrease Outcome: Progressing   Problem: Nutrition: Goal: Adequate nutrition will be maintained Outcome: Progressing   Problem: Elimination: Goal: Will not experience complications related to bowel motility Outcome: Progressing Goal: Will not experience complications related to urinary retention Outcome: Progressing   Problem: Pain Managment: Goal: General experience of comfort will improve Outcome: Progressing   Problem: Safety: Goal: Ability to remain free from injury will improve Outcome: Progressing   Problem: Skin Integrity: Goal: Risk for impaired skin integrity will decrease Outcome: Progressing   Problem: Education: Goal: Knowledge of disease or condition will improve Outcome: Progressing Goal: Knowledge of the prescribed therapeutic regimen will improve Outcome: Progressing   Problem: Activity: Goal: Risk for activity intolerance will decrease Outcome: Progressing   Problem: Cardiac: Goal: Will achieve and/or maintain hemodynamic stability Outcome: Progressing   Problem: Clinical Measurements: Goal: Postoperative complications will be avoided or  minimized Outcome: Progressing   Problem: Respiratory: Goal: Respiratory status will improve Outcome: Progressing   Problem: Pain Management: Goal: Pain level will decrease Outcome: Progressing   Problem: Skin Integrity: Goal: Wound healing without signs and symptoms infection will improve Outcome: Progressing

## 2020-10-26 ENCOUNTER — Inpatient Hospital Stay (HOSPITAL_COMMUNITY): Payer: Medicare Other

## 2020-10-26 NOTE — Progress Notes (Addendum)
      [address]       [place] [address]             [phone]      11 Days Post-Op Procedure(s) (LRB): XI ROBOTIC ASSISTED THORASCOPY-WEDGE RESECTION (Left) INTERCOSTAL NERVE BLOCK (Left) NODE DISSECTION (Left) Subjective: Feels okay this morning, had some pain after chest tube removal yesterday but felt better after he received some pain medication. No further pain since removal.  Objective: Vital signs in last 24 hours: Temp:  [97.7 F (36.5 C)-98.4 F (36.9 C)] 97.7 F (36.5 C) (09/13 0303) Pulse Rate:  [49-116] 116 (09/13 0738) Cardiac Rhythm: Normal sinus rhythm (09/13 0704) Resp:  [17-23] 20 (09/13 0738) BP: (113-135)/(54-74) 115/56 (09/13 0600) SpO2:  [94 %-99 %] 97 % (09/13 0738)     Intake/Output from previous day: 09/12 0701 - 09/13 0700 In: 220 [P.O.:220] Out: 550 [Urine:500; Chest Tube:50] Intake/Output this shift: No intake/output data recorded.  General appearance: alert, cooperative, and no distress Heart: irregularly irregular rhythm Lungs: clear to auscultation bilaterally Abdomen: soft, non-tender; bowel sounds normal; no masses,  no organomegaly Extremities: extremities normal, atraumatic, no cyanosis or edema Wound: clean and dry  Lab Results: No results for input(s): WBC, HGB, HCT, PLT in the last 72 hours. BMET: No results for input(s): NA, K, CL, CO2, GLUCOSE, BUN, CREATININE, CALCIUM in the last 72 hours.  PT/INR: No results for input(s): LABPROT, INR in the last 72 hours. ABG    Component Value Date/Time   PHART 7.369 10/13/2020 1520   HCO3 29.7 (H) 10/13/2020 1520   O2SAT 94.5 10/13/2020 1520   CBG (last 3)  No results for input(s): GLUCAP in the last 72 hours.  Assessment/Plan: S/P Procedure(s) (LRB): XI ROBOTIC ASSISTED THORASCOPY-WEDGE RESECTION (Left) INTERCOSTAL NERVE BLOCK (Left) NODE DISSECTION (Left)  CV- NSR on Coreg and Entresto Pulm- connected to Mini Express, lateral pneumo is stable,  continue  nebs prn, home oxygen is in the room.  H/O of PE on Eliquis Dispo- patient stable, nebs prn, home oxygen has been delivered.   Will discuss timing of discharge with Dr. Roxan Hockey   LOS: 11 days    Kenneth Fischer 10/26/2020 Wants to go home I do not see any air leak- will dc pigtail If follow up CXR ok, will dc home later today  [place]. Roxan Hockey, MD [facility] [phone]

## 2020-10-26 NOTE — Progress Notes (Signed)
Chest tube removed without complications. Pt tolerated well. Site clean, dry, and intact.

## 2020-10-26 NOTE — TOC Transition Note (Signed)
Transition of Care [facility]) - CM/SW Discharge Note   Patient Details  Name: Kenneth Fischer MRN: [id number] Date of Birth: [birth year]  Transition of Care [facility]) CM/SW Contact:  Zenon Mayo, RN Phone Number: 10/26/2020, 12:51 PM   Clinical Narrative:    Mini express chest tube has been removed, the [facility] will still come out to check on patient after dc.  He has the home oxygen in his room.     Final next level of care: [place] Barriers to Discharge: No Barriers Identified   Patient Goals and CMS Choice Patient states their goals for this hospitalization and ongoing recovery are:: return home CMS Medicare.gov Compare Post Acute Care list provided to:: Patient Choice offered to / list presented to : Patient  Discharge Placement                       Discharge Plan and Services                DME Arranged: Oxygen DME Agency: [employer] Date DME Agency Contacted: 10/22/20 Time DME Agency Contacted: 1700 Representative spoke with at DME Agency: Thedore Mins HH Arranged: RN [place] Agency: [place] Date [place]: 10/22/20 Time [place]: 1700 Representative spoke with at [place]: [place] Determinants of Health ([place]) Interventions     Readmission Risk Interventions No flowsheet data found.

## 2020-10-28 ENCOUNTER — Other Ambulatory Visit: Payer: Self-pay | Admitting: *Deleted

## 2020-10-28 NOTE — Progress Notes (Signed)
The proposed treatment discussed in conference is for discussion purpose only and is not a binding recommendation.  The patients have not been physically examined, or presented with their treatment options.  Therefore, final treatment plans cannot be decided.  

## 2020-11-01 ENCOUNTER — Other Ambulatory Visit: Payer: Self-pay | Admitting: Thoracic Surgery (Cardiothoracic Vascular Surgery)

## 2020-11-01 DIAGNOSIS — R918 Other nonspecific abnormal finding of lung field: Secondary | ICD-10-CM

## 2020-11-02 ENCOUNTER — Other Ambulatory Visit: Payer: Self-pay

## 2020-11-02 ENCOUNTER — Ambulatory Visit
Admission: RE | Admit: 2020-11-02 | Discharge: 2020-11-02 | Disposition: A | Discharge status: Home / Self Care | Payer: Medicare Other | Source: Ambulatory Visit | Attending: Thoracic Surgery (Cardiothoracic Vascular Surgery) | Admitting: Thoracic Surgery (Cardiothoracic Vascular Surgery)

## 2020-11-02 ENCOUNTER — Ambulatory Visit (INDEPENDENT_AMBULATORY_CARE_PROVIDER_SITE_OTHER): Payer: Self-pay | Admitting: Physician Assistant

## 2020-11-02 VITALS — BP 149/70 | HR 74 | Resp 20 | Ht 72.0 in | Wt 205.0 lb

## 2020-11-02 DIAGNOSIS — Z09 Encounter for follow-up examination after completed treatment for conditions other than malignant neoplasm: Secondary | ICD-10-CM

## 2020-11-02 DIAGNOSIS — R918 Other nonspecific abnormal finding of lung field: Secondary | ICD-10-CM

## 2020-11-02 NOTE — Progress Notes (Signed)
[address]       [place], [address]             [phone]         Kenneth Fischer is a 70 y.o. male who was admitted electively and taken the operating room on 10/15/2020 at which time he underwent left robotic assisted thoracoscopic surgery.  He had a left lower lobe wedge resection, lymph node sampling, and pleural biopsies.  Initial frozen section findings were consistent with metastatic renal cell carcinoma. He has issues with a persistent air leak.  His blake drain will be removed on 9/12. His pigtail catheter was removed on 9/13. His repeat CXR showed stable size pntx.     He presents to the office today for a follow-up visit and a repeat CXR. Overall, he feels okay and his shortness of breath is improving but still there. He walks about 200 meters twice a day.    No diagnosis found. Past Medical History:  Diagnosis Date   Bronchitis    Cancer ([place])    right renal neoplasm   COPD (chronic obstructive pulmonary disease) (HCC)    DVT (deep vein thrombosis) in pregnancy    History of kidney stones    Hypertension    Pneumonia    No past surgical history pertinent negatives on file. Scheduled Meds: Current Outpatient Medications on File Prior to Visit  Medication Sig Dispense Refill   albuterol (VENTOLIN HFA) 108 (90 Base) MCG/ACT inhaler Inhale 2 puffs into the lungs every 6 (six) hours as needed for wheezing or shortness of breath.      apixaban (ELIQUIS) 5 MG TABS tablet Take 5 mg by mouth 2 (two) times daily.     carvedilol (COREG) 12.5 MG tablet Take 12.5 mg by mouth 2 (two) times daily.     furosemide (LASIX) 20 MG tablet Take 20 mg by mouth daily.     sacubitril-valsartan (ENTRESTO) 49-51 MG Take 1 tablet by mouth 2 (two) times daily.     traMADol (ULTRAM) 50 MG tablet Take 1 tablet (50 mg total) by mouth every 6 (six) hours as needed (mild pain). 30 tablet 0   No current facility-administered medications on file prior to visit.     Allergies   Allergen Reactions   Codeine Hives    Nausea, vomiting, hives    Morphine And Related Hives    Hives, nausea and vomiting   Active Problems:   * No active hospital problems. * There were no vitals filed for this visit.    Vitals:   11/02/20 1621  BP: (!) 149/70  Pulse: 74  Resp: 20  SpO2: 94%    Cor: RRR, no murmur Pulm: rhonchi in all fields Wound: well healed, chest tube suture removed Ext: no edema  Assessment & Plan  Severe COPD- untreated, discussed starting an inhaled ccs/long acting bronchodilator. I'm not sure why this wasn't started over the last 20 years. 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 We will need to coordinate this with Dr. Alen Blew. He will also need to be followed overtime for reoccurrence Small pleural effusion/small loculated pneumothorax-He is on lasix and encouraged to still use his IS. He is encouraged to continue walking daily. He will have a f/u CXR with Dr. Roxan Hockey  Plan: He is requesting an appointment with Dr. Roxan Hockey and Dr. Alen Blew on the  same day. We will get a CXR during that appointment so we can evaluate his small pleural effusion and small pneumo to make sure they have resolved. He will hopefully only need the home oxygen a few weeks. He also needs proper treatment for his COPD with inhaled medications. This is most appropriate for his PCP to address.   F/u with Dr. Roxan Hockey F/u with PCP F/u with Dr. Olean Ree 11/02/2020

## 2020-11-15 ENCOUNTER — Other Ambulatory Visit: Payer: Self-pay | Admitting: Thoracic Surgery (Cardiothoracic Vascular Surgery)

## 2020-11-15 DIAGNOSIS — R918 Other nonspecific abnormal finding of lung field: Secondary | ICD-10-CM

## 2020-11-16 ENCOUNTER — Other Ambulatory Visit: Payer: Self-pay

## 2020-11-16 ENCOUNTER — Telehealth: Payer: Self-pay | Admitting: Oncology

## 2020-11-16 ENCOUNTER — Encounter: Payer: Self-pay | Admitting: Thoracic Surgery (Cardiothoracic Vascular Surgery)

## 2020-11-16 ENCOUNTER — Ambulatory Visit (INDEPENDENT_AMBULATORY_CARE_PROVIDER_SITE_OTHER): Payer: Self-pay | Admitting: Thoracic Surgery (Cardiothoracic Vascular Surgery)

## 2020-11-16 ENCOUNTER — Ambulatory Visit
Admission: RE | Admit: 2020-11-16 | Discharge: 2020-11-16 | Disposition: A | Discharge status: Home / Self Care | Payer: Medicare Other | Source: Ambulatory Visit | Attending: Thoracic Surgery (Cardiothoracic Vascular Surgery) | Admitting: Thoracic Surgery (Cardiothoracic Vascular Surgery)

## 2020-11-16 ENCOUNTER — Telehealth: Payer: Self-pay

## 2020-11-16 VITALS — BP 148/66 | HR 71 | Resp 20 | Ht 72.0 in | Wt 200.0 lb

## 2020-11-16 DIAGNOSIS — R918 Other nonspecific abnormal finding of lung field: Secondary | ICD-10-CM

## 2020-11-16 DIAGNOSIS — I509 Heart failure, unspecified: Secondary | ICD-10-CM

## 2020-11-16 DIAGNOSIS — Z09 Encounter for follow-up examination after completed treatment for conditions other than malignant neoplasm: Secondary | ICD-10-CM

## 2020-11-16 DIAGNOSIS — R911 Solitary pulmonary nodule: Secondary | ICD-10-CM

## 2020-11-16 DIAGNOSIS — Z85528 Personal history of other malignant neoplasm of kidney: Secondary | ICD-10-CM

## 2020-11-16 NOTE — Progress Notes (Signed)
[address]       ,[place] [address]             [phone]     HPI: Mr. Muscat returns for scheduled follow-up visit  Keiton Cosma is a 70 year old man with a history of hypertension, kidney stones, renal cell carcinoma, nephrectomy, stage III chronic kidney disease, CHF, PE, tobacco abuse, COPD, and a lung nodule.  He had a left nephrectomy in 2021 for renal cell carcinoma.  There was a left lower lobe lung nodule on a scan at that time.  On follow-up that nodule increased in size and the PET/CT showed moderate uptake.  He has limited pulmonary function with an FEV1 of 0.89, which improved to 1.1 (31% of normal) with bronchodilators.  Postoperatively had an air leak but once it resolved he went home.  He did go home on oxygen.  He currently is using the oxygen when he is up walking but does not use it at rest.  He is hoping to get a smaller oxygen tank.  He is not having any significant incisional pain.   Past Medical History:  Diagnosis Date   Bronchitis    Cancer ([place])    right renal neoplasm   COPD (chronic obstructive pulmonary disease) (HCC)    DVT (deep vein thrombosis) in pregnancy    History of kidney stones    Hypertension    Pneumonia     Current Outpatient Medications  Medication Sig Dispense Refill   albuterol (VENTOLIN HFA) 108 (90 Base) MCG/ACT inhaler Inhale 2 puffs into the lungs every 6 (six) hours as needed for wheezing or shortness of breath.      apixaban (ELIQUIS) 5 MG TABS tablet Take 5 mg by mouth 2 (two) times daily.     carvedilol (COREG) 12.5 MG tablet Take 12.5 mg by mouth 2 (two) times daily.     furosemide (LASIX) 20 MG tablet Take 20 mg by mouth daily.     sacubitril-valsartan (ENTRESTO) 49-51 MG Take 1 tablet by mouth 2 (two) times daily.     traMADol (ULTRAM) 50 MG tablet Take 1 tablet (50 mg total) by mouth every 6 (six) hours as needed (mild pain). 30 tablet 0   No current facility-administered medications for this  visit.    Physical Exam BP (!) 148/66 (BP Location: Right Arm, Patient Position: Sitting, Cuff Size: Normal)   Pulse 71   Resp 20   Ht 6' (1.829 m)   Wt 200 lb (90.7 kg)   SpO2 94% Comment: 2LNC  BMI 27.70 kg/m  70 year old man in no acute distress Alert and oriented x3, no focal deficit Lungs with diminished breath sounds bilaterally Incisions well-healed  Diagnostic Tests: I personally reviewed his chest x-ray.  There is no significant effusion.  Underlying COPD changes and scarring with recent postop changes.  Impression: Armondo Cech is a 70 year old man man with a history of hypertension, kidney stones, renal cell carcinoma, nephrectomy, stage III chronic kidney disease, CHF, PE, tobacco abuse, COPD, and a lung nodule.   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  Status post wedge resection of that nodule.  He has other nodules that have been stable over time.  As far as we know this is oligometastatic disease with a single focus.  Now that is resected the question is whether he needs any additional treatment beyond that.  He needs to see Dr. Alen Blew to answer that question.  From a surgical  standpoint he is doing well with regards to pain.  He still on some oxygen.  He had pretty bad PFTs preoperatively but was not on oxygen.  He likely will be able to wean off of that as we resected only a very small part of his lung.   Plan: Follow-up with Dr. Alen Blew regarding treatment plan Will contact home health agency regarding a smaller oxygen tank Return in a month with a PA lateral chest x-ray  [name], MD [facility] [phone]

## 2020-11-16 NOTE — Telephone Encounter (Signed)
Scheduled per 10/4 sch msg, msg was left with pt

## 2020-11-16 NOTE — Telephone Encounter (Signed)
At patient's request during today's visit order faxed [phone] per Dr. Roxan Hockey to [facility] in [place] for evaluation by respiratory therapist and switch to lightweight portable "B" tank. Patient and family aware and should be getting call from home health company to set up appointment for evaluation.

## 2020-11-26 ENCOUNTER — Inpatient Hospital Stay: Payer: Medicare Other | Admitting: Oncology

## 2020-12-20 ENCOUNTER — Other Ambulatory Visit: Payer: Self-pay | Admitting: Thoracic Surgery (Cardiothoracic Vascular Surgery)

## 2020-12-20 DIAGNOSIS — R918 Other nonspecific abnormal finding of lung field: Secondary | ICD-10-CM

## 2020-12-21 ENCOUNTER — Other Ambulatory Visit: Payer: Self-pay

## 2020-12-21 ENCOUNTER — Ambulatory Visit (INDEPENDENT_AMBULATORY_CARE_PROVIDER_SITE_OTHER): Payer: Self-pay | Admitting: Thoracic Surgery (Cardiothoracic Vascular Surgery)

## 2020-12-21 ENCOUNTER — Inpatient Hospital Stay: Payer: Medicare Other | Attending: Oncology | Admitting: Oncology

## 2020-12-21 ENCOUNTER — Ambulatory Visit
Admission: RE | Admit: 2020-12-21 | Discharge: 2020-12-21 | Disposition: A | Discharge status: Home / Self Care | Payer: Medicare Other | Source: Ambulatory Visit | Attending: Thoracic Surgery (Cardiothoracic Vascular Surgery) | Admitting: Thoracic Surgery (Cardiothoracic Vascular Surgery)

## 2020-12-21 VITALS — BP 134/58 | HR 62 | Temp 97.3°F | Resp 17 | Wt 205.1 lb

## 2020-12-21 VITALS — BP 128/72 | HR 74 | Resp 20 | Ht 72.0 in | Wt 205.0 lb

## 2020-12-21 DIAGNOSIS — C7800 Secondary malignant neoplasm of unspecified lung: Secondary | ICD-10-CM | POA: Diagnosis not present

## 2020-12-21 DIAGNOSIS — Z09 Encounter for follow-up examination after completed treatment for conditions other than malignant neoplasm: Secondary | ICD-10-CM

## 2020-12-21 DIAGNOSIS — C649 Malignant neoplasm of unspecified kidney, except renal pelvis: Secondary | ICD-10-CM | POA: Diagnosis present

## 2020-12-21 DIAGNOSIS — Z905 Acquired absence of kidney: Secondary | ICD-10-CM | POA: Diagnosis not present

## 2020-12-21 DIAGNOSIS — D49512 Neoplasm of unspecified behavior of left kidney: Secondary | ICD-10-CM | POA: Diagnosis not present

## 2020-12-21 DIAGNOSIS — R918 Other nonspecific abnormal finding of lung field: Secondary | ICD-10-CM

## 2020-12-21 DIAGNOSIS — R911 Solitary pulmonary nodule: Secondary | ICD-10-CM

## 2020-12-21 NOTE — Progress Notes (Signed)
Hematology and Oncology Follow Up for Telemedicine Visits  Kenneth Fischer [id number] 10/17/1950 70 y.o. 12/21/2020 3:33 PM Kenneth Fischer, MDStambaugh, Merris, MD      Principle Diagnosis: 70 year old man with T3a clear-cell renal cell carcinoma diagnosed in 2021.  He developed isolated pulmonary metastasis measuring 1.6 cm after surgical resection in September 2022.  Prior Therapy:  He underwent left robotic assisted laparoscopic radical nephrectomy on February 20, 2019.  The final pathology showed 2 foci measuring 4.8 and 1.5 cm clear-cell renal cell carcinoma with tumor invasion into the perirenal fat indicating T3a disease.  Histological grade was 2.  No evidence of lymphadenopathy or metastatic disease on imaging.  He is status post robotic assisted left wedge resection of the left lower lobe nodule and lymph node sampling completed by Dr. Roxan Hockey on October 16, 2020.  The final pathology showed renal cell carcinoma.   Current therapy: Active surveillance and under evaluation for possible additional therapy.  Interim History: [name] returns today for a follow-up visit.  Since the last visit, he underwent surgical resection of his isolated pulmonary metastasis and September 2022.  He reports feeling well and recovering without any major issues at this time.  He is still requiring oxygen with improvement in his dyspnea on exertion.  His performance status is close to baseline.     Medications: Updated on review. Current Outpatient Medications  Medication Sig Dispense Refill   albuterol (VENTOLIN HFA) 108 (90 Base) MCG/ACT inhaler Inhale 2 puffs into the lungs every 6 (six) hours as needed for wheezing or shortness of breath.      apixaban (ELIQUIS) 5 MG TABS tablet Take 5 mg by mouth 2 (two) times daily.     carvedilol (COREG) 12.5 MG tablet Take 12.5 mg by mouth 2 (two) times daily.     furosemide (LASIX) 20 MG tablet Take 20 mg by mouth daily.     sacubitril-valsartan  (ENTRESTO) 49-51 MG Take 1 tablet by mouth 2 (two) times daily.     traMADol (ULTRAM) 50 MG tablet Take 1 tablet (50 mg total) by mouth every 6 (six) hours as needed (mild pain). 30 tablet 0   No current facility-administered medications for this visit.     Allergies:  Allergies  Allergen Reactions   Codeine Hives    Nausea, vomiting, hives    Morphine And Related Hives    Hives, nausea and vomiting    Physical examination Blood pressure (!) 134/58, pulse 62, temperature (!) 97.3 F (36.3 C), temperature source Temporal, resp. rate 17, weight 205 lb 1.6 oz (93 kg), SpO2 98 %.  ECOG 1  General appearance: Comfortable appearing without any discomfort Head: Normocephalic without any trauma Oropharynx: Mucous membranes are moist and pink without any thrush or ulcers. Eyes: Pupils are equal and round reactive to light. Lymph nodes: No cervical, supraclavicular, inguinal or axillary lymphadenopathy.   Heart:regular rate and rhythm.  S1 and S2 without leg edema. Lung: Clear without any rhonchi or wheezes.  No dullness to percussion. Abdomin: Soft, nontender, nondistended with good bowel sounds.  No hepatosplenomegaly. Musculoskeletal: No joint deformity or effusion.  Full range of motion noted. Neurological: No deficits noted on motor, sensory and deep tendon reflex exam. Skin: No petechial rash or dryness.  Appeared moist.       Lab Results: Lab Results  Component Value Date   WBC 5.8 10/22/2020   HGB 12.6 (L) 10/22/2020   HCT 41.9 10/22/2020   MCV 87.1 10/22/2020   PLT 229 10/22/2020  Chemistry      Component Value Date/Time   NA 139 10/22/2020 0045   K 4.3 10/22/2020 0045   CL 97 (L) 10/22/2020 0045   CO2 36 (H) 10/22/2020 0045   BUN 18 10/22/2020 0045   CREATININE 0.82 10/22/2020 0045      Component Value Date/Time   CALCIUM 9.4 10/22/2020 0045   ALKPHOS 100 10/17/2020 0027   AST 20 10/17/2020 0027   ALT 11 10/17/2020 0027   BILITOT 0.6 10/17/2020 0027           Impression and Plan:   70 year old man with:   1.  Kidney cancer diagnosed in 2020.  He was found to have T3a clear-cell renal cell carcinoma and subsequently has isolated pulmonary metastasis.  The natural course of this disease was reviewed at this time and treatment options were discussed.  He does have stage IV disease which has effectively resected.  Management options include active surveillance versus adjuvant Pembrolizumab were discussed.  Complication associated with immunotherapy as well as the benefit were discussed.  After discussion today, we opted against adjuvant therapy and will continue with active surveillance.  The benefit from immunotherapy in his setting is marginal given his low risk disease and the logistics of immunotherapy administration.   2.  13 mm lung nodule diagnosed in May 2022.  This is biopsy proven to be isolated pulmonary metastasis from kidney cancer.  No additional work-up is needed at this time.   3.  Follow-up: Will be in January 2023 for repeat evaluation and CT scan.  He is scheduled to return on January 10 and we will arrange    30  minutes were dedicated to this visit. The time was spent on reviewing pathology results, discussing treatment options, discussing complications related to therapy and answering questions regarding future plan.   Zola Button, MD 12/21/2020 3:33 PM

## 2020-12-21 NOTE — Progress Notes (Signed)
Kenneth Fischer,Kenneth Fischer,Kenneth Fischer       HPI: [name] returns for a scheduled postoperative follow-up visit  Kenneth Fischer is a 70 year old man with a history of tobacco abuse, COPD, hypertension, kidney stones, renal cell carcinoma, stage III chronic kidney disease, CHF, PE, and tobacco abuse.  He had a left nephrectomy in 2021 for renal cell carcinoma.  There was a left lower lobe lung nodule on a scan at that time.  On follow-up the nodule increased in size and PET/CT showed moderate uptake.  I did a robotic assisted wedge resection of the lower lobe nodule in September 2022.  There were extensive adhesions in the chest.  He did have an air leak postoperatively.  That resolved prior to discharge but he did go home with oxygen.  He says he is feeling well.  He is not having any significant pain.  He is still on oxygen.  Past Medical History:  Diagnosis Date   Bronchitis    Cancer ([place])    right renal neoplasm   COPD (chronic obstructive pulmonary disease) (HCC)    DVT (deep vein thrombosis) in pregnancy    History of kidney stones    Hypertension    Pneumonia     Current Outpatient Medications  Medication Sig Dispense Refill   albuterol (VENTOLIN HFA) 108 (90 Base) MCG/ACT inhaler Inhale 2 puffs into the lungs every 6 (six) hours as needed for wheezing or shortness of breath.      apixaban (ELIQUIS) 5 MG TABS tablet Take 5 mg by mouth 2 (two) times daily.     carvedilol (COREG) 12.5 MG tablet Take 12.5 mg by mouth 2 (two) times daily.     furosemide (LASIX) 20 MG tablet Take 20 mg by mouth daily.     sacubitril-valsartan (ENTRESTO) 49-51 MG Take 1 tablet by mouth 2 (two) times daily.     traMADol (ULTRAM) 50 MG tablet Take 1 tablet (50 mg total) by mouth every 6 (six) hours as needed (mild pain). 30 tablet 0   No current  facility-administered medications for this visit.    Physical Exam BP 128/72 (BP Location: Right Arm, Patient Position: Sitting)   Pulse 74   Resp 20   Ht 6' (1.829 m)   Wt 205 lb (93 kg)   SpO2 94% Comment: 2L O2 [place]  BMI 27.55 kg/m  70 year old man in no acute distress Alert and oriented x3 with no focal deficits Lungs with diminished breath sounds bilaterally, no wheezing Cardiac regular rate and rhythm  Diagnostic Tests: CHEST - 2 VIEW   COMPARISON:  Radiographs 11/16/2020 and 11/02/2020. PET-CT 09/23/2020.   FINDINGS: The heart size and mediastinal contours are stable. There are stable calcified granulomas in both lungs as well as extensive calcified mediastinal and hilar lymph nodes. Architectural distortion and scarring are present in both lungs. No recurrent pneumothorax, enlarging nodules or significant pleural effusions are identified. The bones appear unchanged.   IMPRESSION: Stable postoperative appearance of the chest with bilateral parenchymal scarring. No enlarging nodules or recurrent pneumothorax identified.     Electronically Signed   By: Kenneth Fischer M.D.   On: 12/21/2020 14:12 I  personally reviewed the chest x-ray images.  Stable chronic changes.  Decrease in loculated left pleural effusion, which was small to begin with.  Impression: Kenneth Fischer is a 70 year old man with a history of tobacco abuse, COPD, hypertension, kidney stones, renal cell carcinoma, stage III chronic kidney disease, CHF, PE, and tobacco abuse.  He had an enlarging left lower lobe lung nodule that was hypermetabolic on PET/CT.  He had a robotic wedge resection of the nodule which turned out to be metastatic renal cell carcinoma.  He had an air leak initially.  He did have extensive adhesions in the chest likely related to his previous nephrectomy.  He is having minimal discomfort.  He remains on oxygen.  He has extensive underlying pulmonary disease with a baseline FEV1 of  about 0.8.  He really had a relatively small amount of pulmonary parenchyma removed.  Hopefully he will be able to wean off the oxygen eventually.  Plan: Follow-up with Dr. Alen Blew as scheduled this afternoon I will see him back in about 2 months with a chest x-ray  Kenneth Nakayama, MD [facility] [phone]

## 2021-02-16 ENCOUNTER — Telehealth: Payer: Self-pay | Admitting: *Deleted

## 2021-02-16 ENCOUNTER — Other Ambulatory Visit: Payer: Self-pay | Admitting: *Deleted

## 2021-02-16 ENCOUNTER — Other Ambulatory Visit: Payer: Self-pay | Admitting: Thoracic Surgery (Cardiothoracic Vascular Surgery)

## 2021-02-16 DIAGNOSIS — R918 Other nonspecific abnormal finding of lung field: Secondary | ICD-10-CM

## 2021-02-16 DIAGNOSIS — D49512 Neoplasm of unspecified behavior of left kidney: Secondary | ICD-10-CM

## 2021-02-16 NOTE — Telephone Encounter (Signed)
Returned PC to patient's wife Levander Campion, informed her the patient has CT labs scheduled at [facility] on 02/18/21 @ 9:30 and his CT scan is scheduled at [facility] on Monday, 02/21/21 @ 8:00, he should arrive at 7:00.  She verbalizes understanding.

## 2021-02-18 ENCOUNTER — Other Ambulatory Visit: Payer: Self-pay

## 2021-02-18 ENCOUNTER — Inpatient Hospital Stay (HOSPITAL_COMMUNITY): Payer: Medicare Other | Attending: Hematology

## 2021-02-18 ENCOUNTER — Ambulatory Visit (HOSPITAL_COMMUNITY)
Admission: RE | Admit: 2021-02-18 | Discharge: 2021-02-18 | Disposition: A | Discharge status: Home / Self Care | Payer: Medicare Other | Source: Ambulatory Visit | Attending: Thoracic Surgery (Cardiothoracic Vascular Surgery) | Admitting: Thoracic Surgery (Cardiothoracic Vascular Surgery)

## 2021-02-18 DIAGNOSIS — R918 Other nonspecific abnormal finding of lung field: Secondary | ICD-10-CM | POA: Insufficient documentation

## 2021-02-18 DIAGNOSIS — D49512 Neoplasm of unspecified behavior of left kidney: Secondary | ICD-10-CM

## 2021-02-18 LAB — CBC WITH DIFFERENTIAL/PLATELET
Abs Immature Granulocytes: 0.02 10*3/uL (ref 0.00–0.07)
Basophils Absolute: 0 10*3/uL (ref 0.0–0.1)
Basophils Relative: 1 %
Eosinophils Absolute: 0.3 10*3/uL (ref 0.0–0.5)
Eosinophils Relative: 5 %
HCT: 43.1 % (ref 39.0–52.0)
Hemoglobin: 13.3 g/dL (ref 13.0–17.0)
Immature Granulocytes: 0 %
Lymphocytes Relative: 10 %
Lymphs Abs: 0.5 10*3/uL — ABNORMAL LOW (ref 0.7–4.0)
MCH: 27.8 pg (ref 26.0–34.0)
MCHC: 30.9 g/dL (ref 30.0–36.0)
MCV: 90 fL (ref 80.0–100.0)
Monocytes Absolute: 0.4 10*3/uL (ref 0.1–1.0)
Monocytes Relative: 8 %
Neutro Abs: 3.9 10*3/uL (ref 1.7–7.7)
Neutrophils Relative %: 76 %
Platelets: 252 10*3/uL (ref 150–400)
RBC: 4.79 MIL/uL (ref 4.22–5.81)
RDW: 14.6 % (ref 11.5–15.5)
WBC: 5.2 10*3/uL (ref 4.0–10.5)
nRBC: 0 % (ref 0.0–0.2)

## 2021-02-18 LAB — COMPREHENSIVE METABOLIC PANEL
ALT: 15 U/L (ref 0–44)
AST: 19 U/L (ref 15–41)
Albumin: 3.6 g/dL (ref 3.5–5.0)
Alkaline Phosphatase: 142 U/L — ABNORMAL HIGH (ref 38–126)
Anion gap: 8 (ref 5–15)
BUN: 20 mg/dL (ref 8–23)
CO2: 35 mmol/L — ABNORMAL HIGH (ref 22–32)
Calcium: 9.3 mg/dL (ref 8.9–10.3)
Chloride: 100 mmol/L (ref 98–111)
Creatinine, Ser: 0.92 mg/dL (ref 0.61–1.24)
GFR, Estimated: >60 mL/min (ref >60)
Glucose, Bld: 101 mg/dL — ABNORMAL HIGH (ref 70–99)
Potassium: 4.9 mmol/L (ref 3.5–5.1)
Sodium: 143 mmol/L (ref 135–145)
Total Bilirubin: 0.4 mg/dL (ref 0.3–1.2)
Total Protein: 7 g/dL (ref 6.5–8.1)

## 2021-02-21 ENCOUNTER — Ambulatory Visit (HOSPITAL_COMMUNITY): Payer: Medicare Other

## 2021-02-21 ENCOUNTER — Other Ambulatory Visit: Payer: Self-pay | Admitting: *Deleted

## 2021-02-21 ENCOUNTER — Other Ambulatory Visit: Payer: Self-pay

## 2021-02-21 ENCOUNTER — Ambulatory Visit (HOSPITAL_COMMUNITY)
Admission: RE | Admit: 2021-02-21 | Discharge: 2021-02-21 | Disposition: A | Discharge status: Home / Self Care | Payer: Medicare Other | Source: Ambulatory Visit | Attending: Oncology | Admitting: Oncology

## 2021-02-21 DIAGNOSIS — D49512 Neoplasm of unspecified behavior of left kidney: Secondary | ICD-10-CM

## 2021-02-21 MED ORDER — IOHEXOL 300 MG/ML  SOLN
100.0000 mL | Freq: Once | INTRAMUSCULAR | Status: AC | PRN
  Administered 2021-02-21: 100 mL via INTRAVENOUS

## 2021-02-22 ENCOUNTER — Inpatient Hospital Stay: Payer: Medicare Other | Attending: Oncology | Admitting: Oncology

## 2021-02-22 ENCOUNTER — Inpatient Hospital Stay: Payer: Medicare Other

## 2021-02-22 ENCOUNTER — Encounter: Payer: Medicare Other | Admitting: Thoracic Surgery (Cardiothoracic Vascular Surgery)

## 2021-02-22 VITALS — BP 174/58 | HR 58 | Temp 97.9°F | Resp 18 | Ht 72.0 in | Wt 212.7 lb

## 2021-02-22 DIAGNOSIS — Z7901 Long term (current) use of anticoagulants: Secondary | ICD-10-CM | POA: Diagnosis not present

## 2021-02-22 DIAGNOSIS — Z79899 Other long term (current) drug therapy: Secondary | ICD-10-CM | POA: Diagnosis not present

## 2021-02-22 DIAGNOSIS — Z905 Acquired absence of kidney: Secondary | ICD-10-CM | POA: Insufficient documentation

## 2021-02-22 DIAGNOSIS — Z85528 Personal history of other malignant neoplasm of kidney: Secondary | ICD-10-CM | POA: Insufficient documentation

## 2021-02-22 DIAGNOSIS — D49512 Neoplasm of unspecified behavior of left kidney: Secondary | ICD-10-CM | POA: Diagnosis not present

## 2021-02-22 DIAGNOSIS — Z9221 Personal history of antineoplastic chemotherapy: Secondary | ICD-10-CM | POA: Diagnosis not present

## 2021-02-22 NOTE — Progress Notes (Signed)
Hematology and Oncology Follow Up for Telemedicine Visits  Kenneth Fischer [id number] October 07, 1950 71 y.o. 02/22/2021 12:52 PM Kenneth Fischer, MDStambaugh, Merris, MD      Principle Diagnosis: 71 year old man with kidney cancer diagnosed in 2021.  He developed isolated pulmonary metastasis in September 2021 after having T3a clear-cell renal cell carcinoma.  Prior Therapy:  He underwent left robotic assisted laparoscopic radical nephrectomy on February 20, 2019.  The final pathology showed 2 foci measuring 4.8 and 1.5 cm clear-cell renal cell carcinoma with tumor invasion into the perirenal fat indicating T3a disease.  Histological grade was 2.  No evidence of lymphadenopathy or metastatic disease on imaging.  He is status post robotic assisted left wedge resection of the left lower lobe nodule and lymph node sampling completed by Dr. Roxan Hockey on October 16, 2020.  The final pathology showed renal cell carcinoma.   Current therapy: Active surveillance   Interim History: [name] is here for a follow-up visit.  Since the last visit, he reports no major changes in his health.  He denies any recent hospitalizations or illnesses.  He denies any chest pain or dyspnea.  He denies any hemoptysis or hematochezia.  His performance status quality of life remain unchanged.    Medications: Reviewed without changes. Current Outpatient Medications  Medication Sig Dispense Refill   albuterol (VENTOLIN HFA) 108 (90 Base) MCG/ACT inhaler Inhale 2 puffs into the lungs every 6 (six) hours as needed for wheezing or shortness of breath.      apixaban (ELIQUIS) 5 MG TABS tablet Take 5 mg by mouth 2 (two) times daily.     carvedilol (COREG) 12.5 MG tablet Take 12.5 mg by mouth 2 (two) times daily.     furosemide (LASIX) 20 MG tablet Take 20 mg by mouth daily.     sacubitril-valsartan (ENTRESTO) 49-51 MG Take 1 tablet by mouth 2 (two) times daily.     traMADol (ULTRAM) 50 MG tablet Take 1 tablet (50 mg  total) by mouth every 6 (six) hours as needed (mild pain). 30 tablet 0   No current facility-administered medications for this visit.     Allergies:  Allergies  Allergen Reactions   Codeine Hives    Nausea, vomiting, hives    Morphine And Related Hives    Hives, nausea and vomiting    Physical examination  Blood pressure (!) 174/58, pulse (!) 58, temperature 97.9 F (36.6 C), temperature source Temporal, resp. rate 18, height 6' (1.829 m), weight 212 lb 11.2 oz (96.5 kg), SpO2 91 %.  ECOG 1    General appearance: Alert, awake without any distress. Head: Atraumatic without abnormalities Oropharynx: Without any thrush or ulcers. Eyes: No scleral icterus. Lymph nodes: No lymphadenopathy noted in the cervical, supraclavicular, or axillary nodes Heart:regular rate and rhythm, without any murmurs or gallops.   Lung: Clear to auscultation without any rhonchi, wheezes or dullness to percussion. Abdomin: Soft, nontender without any shifting dullness or ascites. Musculoskeletal: No clubbing or cyanosis. Neurological: No motor or sensory deficits. Skin: No rashes or lesions.      Lab Results: Lab Results  Component Value Date   WBC 5.2 02/18/2021   HGB 13.3 02/18/2021   HCT 43.1 02/18/2021   MCV 90.0 02/18/2021   PLT 252 02/18/2021     Chemistry      Component Value Date/Time   NA 143 02/18/2021 0958   K 4.9 02/18/2021 0958   CL 100 02/18/2021 0958   CO2 35 (H) 02/18/2021 0958   BUN 20  02/18/2021 0958   CREATININE 0.92 02/18/2021 0958      Component Value Date/Time   CALCIUM 9.3 02/18/2021 0958   ALKPHOS 142 (H) 02/18/2021 0958   AST 19 02/18/2021 0958   ALT 15 02/18/2021 0958   BILITOT 0.4 02/18/2021 0958        IMPRESSION: 1. Interval left lower lobe wedge resection. No new or enlarging pulmonary nodules. 2. Bilateral arterially enhancing adrenal nodules and arterially enhancing nodule of the tail the pancreas are slightly increased in size when  compared with most recent prior contrast enhanced chest CT, concerning for worsening metastatic disease. 3. New sclerotic lesion of the posterior left eighth rib with associated mild step-off deformity, possibly subacute/chronic rib fracture.  So acute    Impression and Plan:   71 year old man with:   1.  Kidney cancer diagnosed in 2021.  He was found to have T3a clear-cell renal cell carcinoma and subsequently has isolated pulmonary metastasis.  CT scan obtained on August 21, 2021 was personally reviewed and discussed with the patient and his family.  He has no clear-cut evidence of metastatic disease although does have adrenal and pancreatic nodules that need to be monitored on future visits.   Management choices at this time including active surveillance, adjuvant Pembrolizumab versus combination systemic therapy were reiterated.  At this time, we opted to continue active surveillance and will repeat imaging studies in 5 months.   2.  Follow-up: In 5 months for repeat follow-up.    30  minutes were spent on this encounter.  Time was dedicated to reviewing imaging studies, treatment choices and future plan of care discussion.   Zola Button, MD 02/22/2021 12:52 PM

## 2021-02-23 ENCOUNTER — Other Ambulatory Visit: Payer: Self-pay | Admitting: Thoracic Surgery (Cardiothoracic Vascular Surgery)

## 2021-02-23 DIAGNOSIS — R918 Other nonspecific abnormal finding of lung field: Secondary | ICD-10-CM

## 2021-02-24 ENCOUNTER — Ambulatory Visit
Admission: RE | Admit: 2021-02-24 | Discharge: 2021-02-24 | Disposition: A | Discharge status: Home / Self Care | Payer: Medicare Other | Source: Ambulatory Visit | Attending: Thoracic Surgery (Cardiothoracic Vascular Surgery) | Admitting: Thoracic Surgery (Cardiothoracic Vascular Surgery)

## 2021-02-24 ENCOUNTER — Ambulatory Visit (INDEPENDENT_AMBULATORY_CARE_PROVIDER_SITE_OTHER): Payer: Medicare Other | Admitting: Thoracic Surgery (Cardiothoracic Vascular Surgery)

## 2021-02-24 ENCOUNTER — Other Ambulatory Visit: Payer: Self-pay

## 2021-02-24 VITALS — BP 166/70 | HR 76 | Resp 20 | Wt 212.0 lb

## 2021-02-24 DIAGNOSIS — C649 Malignant neoplasm of unspecified kidney, except renal pelvis: Secondary | ICD-10-CM | POA: Diagnosis not present

## 2021-02-24 DIAGNOSIS — R918 Other nonspecific abnormal finding of lung field: Secondary | ICD-10-CM

## 2021-02-24 DIAGNOSIS — Z09 Encounter for follow-up examination after completed treatment for conditions other than malignant neoplasm: Secondary | ICD-10-CM

## 2021-02-24 DIAGNOSIS — R911 Solitary pulmonary nodule: Secondary | ICD-10-CM

## 2021-02-24 NOTE — Progress Notes (Signed)
[address]       [place] [address]             [phone]     HPI: [name] returns for scheduled postoperative follow-up visit after a previous wedge resection for metastatic renal cell carcinoma.  Kenneth Fischer is a 71 year old male with a history of tobacco abuse, COPD, hypertension, kidney stones, renal cell carcinoma, stage III chronic kidney disease, CHF, PE, and tobacco abuse.  He had a left nephrectomy for renal cell carcinoma in 2021.  He was followed for a left lower lobe lung nodule.  That increased in size and was hypermetabolic on PET/CT.  I did a wedge resection of the lower lobe nodule in September 2022.  He had a lot of adhesions in the chest.  There was an air leak postoperatively but that resolved prior to discharge.  He did go home on oxygen.  He feels well.  He is not having any pain related to the surgery.  He denies shortness of breath.  He still uses oxygen at night when he sleeps.  He says it helps him sleep.  He saw Dr. Alen Blew recently.  His CT showed no new lung nodules.  There are some nodules in the adrenal glands and the tail of the pancreas that are being followed.  Past Medical History:  Diagnosis Date   Bronchitis    Cancer ([place])    right renal neoplasm   COPD (chronic obstructive pulmonary disease) (HCC)    DVT (deep vein thrombosis) in pregnancy    History of kidney stones    Hypertension    Pneumonia     Current Outpatient Medications  Medication Sig Dispense Refill   albuterol (VENTOLIN HFA) 108 (90 Base) MCG/ACT inhaler Inhale 2 puffs into the lungs every 6 (six) hours as needed for wheezing or shortness of breath.      apixaban (ELIQUIS) 5 MG TABS tablet Take 5 mg by mouth 2 (two) times daily.     carvedilol (COREG) 12.5 MG tablet Take 12.5 mg by mouth 2 (two) times daily.     furosemide (LASIX) 20 MG tablet Take 20 mg by mouth daily.     sacubitril-valsartan (ENTRESTO) 49-51 MG Take 1 tablet by mouth 2 (two) times  daily.     traMADol (ULTRAM) 50 MG tablet Take 1 tablet (50 mg total) by mouth every 6 (six) hours as needed (mild pain). 30 tablet 0   No current facility-administered medications for this visit.    Physical Exam BP (!) 166/70    Pulse 76    Resp 20    Ht (P) 6' (1.829 m)    Wt 212 lb (96.2 kg)    BMI (P) 28.27 kg/m  71 year old man in no acute distress Alert and oriented x3 with no focal deficits Lungs faint crackles at bases, no wheezing Cardiac regular rate and rhythm   Diagnostic Tests: CHEST - 2 VIEW   COMPARISON:  Previous chest radiographs done on 02/18/2021 and CT chest done on 02/21/2021   FINDINGS: Cardiac size is within normal limits. Thoracic aorta is ectatic. There are increased interstitial markings and linear densities in the parahilar regions and lower lung fields with no significant change. Calcified granuloma is seen in the right lower lung fields. There are no new infiltrates or signs of pulmonary edema. There is no pleural effusion or pneumothorax.   IMPRESSION: Increased interstitial markings along with linear densities in both parahilar regions and both  lower lung fields appears stable. This finding may suggest scarring. No new focal infiltrates are seen.     Electronically Signed   By: Kenneth Fischer M.D.   On: 02/24/2021 13:13 I personally reviewed the chest x-ray images.  Extensive scarring in both lungs.  No new or suspicious findings.  Impression: Kenneth Fischer is a 71 year old man is a 71 year old male with a history of tobacco abuse, COPD, hypertension, kidney stones, renal cell carcinoma, stage III chronic kidney disease, CHF, PE, and tobacco abuse.   He had a left nephrectomy for renal cell carcinoma in 2021.  There was a left lower lobe lung nodule noted at that time.  Over time that increased in size and was hypermetabolic on PET.  We did a wedge resection of that and it turned out to be metastatic renal cell carcinoma.  Currently  there were no signs of any other sites of disease.  He met with Dr. Alen Blew and there is no plan for chemotherapy at present.  There are couple of areas of concern on his CT including an spot in the tail of the pancreas and both adrenal glands that will be followed.  From a surgical standpoint he is doing well with no pain.  Plan: Follow-up with Dr. Alen Blew  I will be happy to see Mr. Lunney back anytime in the future if I can be of any further assistance with his care.  Kenneth Nakayama, MD [facility] [phone]

## 2021-07-19 ENCOUNTER — Other Ambulatory Visit: Payer: Medicare Other

## 2021-07-19 ENCOUNTER — Other Ambulatory Visit (HOSPITAL_COMMUNITY): Payer: Medicare Other

## 2021-07-20 ENCOUNTER — Ambulatory Visit (HOSPITAL_COMMUNITY)
Admission: RE | Admit: 2021-07-20 | Discharge: 2021-07-20 | Disposition: A | Discharge status: Home / Self Care | Payer: Medicare Other | Source: Ambulatory Visit | Attending: Oncology | Admitting: Oncology

## 2021-07-20 ENCOUNTER — Ambulatory Visit (HOSPITAL_COMMUNITY): Payer: Medicare Other

## 2021-07-20 DIAGNOSIS — D49512 Neoplasm of unspecified behavior of left kidney: Secondary | ICD-10-CM | POA: Diagnosis present

## 2021-07-20 LAB — POCT I-STAT CREATININE: Creatinine, Ser: 1 mg/dL (ref 0.61–1.24)

## 2021-07-20 MED ORDER — IOHEXOL 300 MG/ML  SOLN
100.0000 mL | Freq: Once | INTRAMUSCULAR | Status: AC | PRN
Start: 2021-07-20 — End: 2021-07-20
  Administered 2021-07-20: 100 mL via INTRAVENOUS

## 2021-07-26 ENCOUNTER — Inpatient Hospital Stay: Payer: Medicare Other | Attending: Oncology | Admitting: Oncology

## 2021-07-26 DIAGNOSIS — D49512 Neoplasm of unspecified behavior of left kidney: Secondary | ICD-10-CM | POA: Diagnosis not present

## 2021-07-26 DIAGNOSIS — Z79899 Other long term (current) drug therapy: Secondary | ICD-10-CM | POA: Insufficient documentation

## 2021-07-26 DIAGNOSIS — Z5111 Encounter for antineoplastic chemotherapy: Secondary | ICD-10-CM | POA: Insufficient documentation

## 2021-07-26 DIAGNOSIS — C7800 Secondary malignant neoplasm of unspecified lung: Secondary | ICD-10-CM | POA: Insufficient documentation

## 2021-07-26 DIAGNOSIS — C649 Malignant neoplasm of unspecified kidney, except renal pelvis: Secondary | ICD-10-CM | POA: Insufficient documentation

## 2021-07-26 DIAGNOSIS — Z905 Acquired absence of kidney: Secondary | ICD-10-CM | POA: Insufficient documentation

## 2021-07-26 NOTE — Progress Notes (Signed)
Hematology and Oncology Follow Up for Telemedicine Visits  Kenneth Fischer [id number] 24-Sep-1950 71 y.o. 07/26/2021 12:56 PM [name], MDStambaugh, Merris, MD   I connected with [name] on 07/26/21 at  1:00 PM EDT by telephone visit and verified that I am speaking with the correct person using two identifiers.   I discussed the limitations, risks, security and privacy concerns of performing an evaluation and management service by telemedicine and the availability of in-person appointments. I also discussed with the patient that there may be a patient responsible charge related to this service. The patient expressed understanding and agreed to proceed.  Other persons participating in the visit and their role in the encounter:    Patient's location: Home Provider's location: Office   Principle Diagnosis: 71 year old man with kidney cancer diagnosed in 2021.  He developed isolated pulmonary metastasis in September 2021 after having T3a clear-cell renal cell carcinoma.   Prior Therapy:   He underwent left robotic assisted laparoscopic radical nephrectomy on February 20, 2019.  The final pathology showed 2 foci measuring 4.8 and 1.5 cm clear-cell renal cell carcinoma with tumor invasion into the perirenal fat indicating T3a disease.  Histological grade was 2.  No evidence of lymphadenopathy or metastatic disease on imaging.   He is status post robotic assisted left wedge resection of the left lower lobe nodule and lymph node sampling completed by Dr. Roxan Hockey on October 16, 2020.  The final pathology showed renal cell carcinoma.     Current therapy: Active surveillance    Interim History: [name] reports feeling well without any complaints.  He denies any nausea, vomiting or abdominal pain.  He denies any excessive fatigue or tiredness.  His performance status quality of life remains unchanged.    Medications: I have reviewed the patient's current medications.  Current  Outpatient Medications  Medication Sig Dispense Refill   albuterol (VENTOLIN HFA) 108 (90 Base) MCG/ACT inhaler Inhale 2 puffs into the lungs every 6 (six) hours as needed for wheezing or shortness of breath.      apixaban (ELIQUIS) 5 MG TABS tablet Take 5 mg by mouth 2 (two) times daily.     carvedilol (COREG) 12.5 MG tablet Take 12.5 mg by mouth 2 (two) times daily.     furosemide (LASIX) 20 MG tablet Take 20 mg by mouth daily.     sacubitril-valsartan (ENTRESTO) 49-51 MG Take 1 tablet by mouth 2 (two) times daily.     traMADol (ULTRAM) 50 MG tablet Take 1 tablet (50 mg total) by mouth every 6 (six) hours as needed (mild pain). 30 tablet 0   No current facility-administered medications for this visit.     Allergies:  Allergies  Allergen Reactions   Codeine Hives    Nausea, vomiting, hives    Morphine And Related Hives    Hives, nausea and vomiting        Lab Results: Lab Results  Component Value Date   WBC 5.2 02/18/2021   HGB 13.3 02/18/2021   HCT 43.1 02/18/2021   MCV 90.0 02/18/2021   PLT 252 02/18/2021     Chemistry      Component Value Date/Time   NA 143 02/18/2021 0958   K 4.9 02/18/2021 0958   CL 100 02/18/2021 0958   CO2 35 (H) 02/18/2021 0958   BUN 20 02/18/2021 0958   CREATININE 1.00 07/20/2021 1233      Component Value Date/Time   CALCIUM 9.3 02/18/2021 0958   ALKPHOS 142 (H) 02/18/2021 0932  AST 19 02/18/2021 0958   ALT 15 02/18/2021 0958   BILITOT 0.4 02/18/2021 0958      IMPRESSION: 1. Status post left nephrectomy. No suspicious soft tissue or contrast enhancement in the nephrectomy bed to suggest local recurrence. 2. Slight interval enlargement of an arterially enhancing nodule of the tip of the pancreatic tail as well as bilateral adrenal nodules, consistent with enlarging metastases. 3. Status post left lower lobe wedge resection. 4. Small right pleural effusion and associated atelectasis or consolidation, increased compared to prior  examination. 5. Status post left lower lobe wedge resection. 6. 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 7. Coronary artery disease.    Impression and Plan:   71 year old man with:   1.  T3a clear-cell renal cell carcinoma diagnosed in 2021.  He developed metastatic disease to the lung in 2022.    He is currently on active surveillance after surgical resection of an isolated pulmonary metastasis.  CT scan obtained on July 20, 2021 was personally reviewed and discussed today with the patient and his family.  He has evidence of metastatic disease more prominently including adrenal nodule as well as pancreatic involvement.  Treatment options at this time were discussed including continued active surveillance versus active treatment.  Given the progression of disease I recommend proceeding with systemic treatment.  His options include combination immunotherapy with ipilimumab and nivolumab versus upfront combination immunotherapy with oral targeted therapy.  Given his overall slow-paced disease and limited site of progression I have recommended proceeding with ipilimumab and nivolumab for 4 cycles before repeat imaging studies.  The patient and his family will discuss this and will let me know in the near future.  2.  IV access: Peripheral veins will be in use.  3.  Follow-up: Will be determined pending his decision regarding the start of treatment.  I provided 20 minutes of non face-to-face telephone visit time during this encounter.  The time was dedicated to reviewing imaging studies, treatment choices and answering questions regarding plan of care.  Zola Button, MD 07/26/2021 12:56 PM

## 2021-07-27 ENCOUNTER — Telehealth: Payer: Self-pay | Admitting: *Deleted

## 2021-07-27 ENCOUNTER — Other Ambulatory Visit: Payer: Self-pay | Admitting: Oncology

## 2021-07-27 DIAGNOSIS — D49512 Neoplasm of unspecified behavior of left kidney: Secondary | ICD-10-CM

## 2021-07-27 DIAGNOSIS — E032 Hypothyroidism due to medicaments and other exogenous substances: Secondary | ICD-10-CM

## 2021-07-27 MED ORDER — PROCHLORPERAZINE MALEATE 10 MG PO TABS: 10.0000 mg | ORAL_TABLET | Freq: Four times a day (QID) | ORAL | 0 refills | Status: DC | PRN

## 2021-07-27 NOTE — Telephone Encounter (Signed)
Pt called to let Dr Alen Blew know he wants to proceed with immunotherapy.

## 2021-07-27 NOTE — Progress Notes (Signed)
START ON PATHWAY REGIMEN - Renal Cell     Cycles 1 through 4: A cycle is every 21 days:     Nivolumab      Ipilimumab    Cycles 5 and beyond: A cycle is every 28 days:     Nivolumab   **Always confirm dose/schedule in your pharmacy ordering system**  Patient Characteristics: Stage IV (Unresected T4M0 or Any T, M1)/Metastatic Disease, Clear Cell, First Line, Intermediate or Poor Risk Therapeutic Status: Stage IV (Unresected T4M0 or Any T, M1)/Metastatic Disease Histology: Clear Cell Line of Therapy: First Line Risk Status: Intermediate Risk Intent of Therapy: Non-Curative / Palliative Intent, Discussed with Patient 

## 2021-07-29 ENCOUNTER — Telehealth: Payer: Self-pay | Admitting: Oncology

## 2021-07-29 NOTE — Telephone Encounter (Signed)
.  Called pt per 6/14 inbasket , Patient was unavailable, a message with appt time and date was left with number on file.

## 2021-08-01 ENCOUNTER — Other Ambulatory Visit: Payer: Self-pay | Admitting: Oncology

## 2021-08-02 NOTE — Progress Notes (Signed)
Pharmacist Chemotherapy Monitoring - Initial Assessment    Anticipated start date: 08/09/21   The following has been reviewed per standard work regarding the patient's treatment regimen: The patient's diagnosis, treatment plan and drug doses, and organ/hematologic function Lab orders and baseline tests specific to treatment regimen  The treatment plan start date, drug sequencing, and pre-medications Prior authorization status  Patient's documented medication list, including drug-drug interaction screen and prescriptions for anti-emetics and supportive care specific to the treatment regimen The drug concentrations, fluid compatibility, administration routes, and timing of the medications to be used The patient's access for treatment and lifetime cumulative dose history, if applicable  The patient's medication allergies and previous infusion related reactions, if applicable   Changes made to treatment plan:  N/A  Follow up needed:  N/A   Philomena Course, [place], 08/02/2021  12:35 PM

## 2021-08-03 ENCOUNTER — Other Ambulatory Visit: Payer: Self-pay

## 2021-08-03 ENCOUNTER — Inpatient Hospital Stay: Payer: Medicare Other

## 2021-08-03 ENCOUNTER — Encounter: Payer: Self-pay | Admitting: Oncology

## 2021-08-03 NOTE — Progress Notes (Signed)
Note placed for registration to provide my card at check-in. 

## 2021-08-05 ENCOUNTER — Other Ambulatory Visit: Payer: Self-pay | Admitting: Oncology

## 2021-08-09 ENCOUNTER — Inpatient Hospital Stay: Payer: Medicare Other

## 2021-08-09 ENCOUNTER — Other Ambulatory Visit: Payer: Self-pay

## 2021-08-09 VITALS — BP 180/68 | HR 57 | Temp 97.9°F | Resp 17 | Wt 208.5 lb

## 2021-08-09 DIAGNOSIS — C7800 Secondary malignant neoplasm of unspecified lung: Secondary | ICD-10-CM | POA: Diagnosis not present

## 2021-08-09 DIAGNOSIS — E032 Hypothyroidism due to medicaments and other exogenous substances: Secondary | ICD-10-CM

## 2021-08-09 DIAGNOSIS — Z5111 Encounter for antineoplastic chemotherapy: Secondary | ICD-10-CM | POA: Diagnosis present

## 2021-08-09 DIAGNOSIS — D49512 Neoplasm of unspecified behavior of left kidney: Secondary | ICD-10-CM

## 2021-08-09 DIAGNOSIS — C649 Malignant neoplasm of unspecified kidney, except renal pelvis: Secondary | ICD-10-CM | POA: Diagnosis present

## 2021-08-09 DIAGNOSIS — Z79899 Other long term (current) drug therapy: Secondary | ICD-10-CM | POA: Diagnosis not present

## 2021-08-09 DIAGNOSIS — Z905 Acquired absence of kidney: Secondary | ICD-10-CM | POA: Diagnosis not present

## 2021-08-09 LAB — CMP (CANCER CENTER ONLY)
ALT: 11 U/L (ref 0–44)
AST: 18 U/L (ref 15–41)
Albumin: 4 g/dL (ref 3.5–5.0)
Alkaline Phosphatase: 149 U/L — ABNORMAL HIGH (ref 38–126)
Anion gap: 5 (ref 5–15)
BUN: 17 mg/dL (ref 8–23)
CO2: 36 mmol/L — ABNORMAL HIGH (ref 22–32)
Calcium: 9.6 mg/dL (ref 8.9–10.3)
Chloride: 99 mmol/L (ref 98–111)
Creatinine: 0.93 mg/dL (ref 0.61–1.24)
GFR, Estimated: >60 mL/min (ref >60)
Glucose, Bld: 103 mg/dL — ABNORMAL HIGH (ref 70–99)
Potassium: 4.7 mmol/L (ref 3.5–5.1)
Sodium: 140 mmol/L (ref 135–145)
Total Bilirubin: 0.6 mg/dL (ref 0.3–1.2)
Total Protein: 7.5 g/dL (ref 6.5–8.1)

## 2021-08-09 LAB — CBC WITH DIFFERENTIAL (CANCER CENTER ONLY)
Abs Immature Granulocytes: 0.01 10*3/uL (ref 0.00–0.07)
Basophils Absolute: 0 10*3/uL (ref 0.0–0.1)
Basophils Relative: 1 %
Eosinophils Absolute: 0.2 10*3/uL (ref 0.0–0.5)
Eosinophils Relative: 4 %
HCT: 44 % (ref 39.0–52.0)
Hemoglobin: 13.5 g/dL (ref 13.0–17.0)
Immature Granulocytes: 0 %
Lymphocytes Relative: 12 %
Lymphs Abs: 0.6 10*3/uL — ABNORMAL LOW (ref 0.7–4.0)
MCH: 26.1 pg (ref 26.0–34.0)
MCHC: 30.7 g/dL (ref 30.0–36.0)
MCV: 85.1 fL (ref 80.0–100.0)
Monocytes Absolute: 0.5 10*3/uL (ref 0.1–1.0)
Monocytes Relative: 9 %
Neutro Abs: 3.7 10*3/uL (ref 1.7–7.7)
Neutrophils Relative %: 74 %
Platelet Count: 224 10*3/uL (ref 150–400)
RBC: 5.17 MIL/uL (ref 4.22–5.81)
RDW: 15.4 % (ref 11.5–15.5)
WBC Count: 4.9 10*3/uL (ref 4.0–10.5)
nRBC: 0 % (ref 0.0–0.2)

## 2021-08-09 LAB — TSH: TSH: 0.415 u[IU]/mL (ref 0.350–4.500)

## 2021-08-09 MED ORDER — FAMOTIDINE IN NACL 20-0.9 MG/50ML-% IV SOLN
20.0000 mg | Freq: Once | INTRAVENOUS | Status: AC
  Administered 2021-08-09: 20 mg via INTRAVENOUS
  Filled 2021-08-09: qty 50

## 2021-08-09 MED ORDER — SODIUM CHLORIDE 0.9 % IV SOLN
2.9100 mg/kg | Freq: Once | INTRAVENOUS | Status: AC
  Administered 2021-08-09: 280 mg via INTRAVENOUS
  Filled 2021-08-09: qty 24

## 2021-08-09 MED ORDER — SODIUM CHLORIDE 0.9 % IV SOLN: Freq: Once | INTRAVENOUS | Status: AC

## 2021-08-09 MED ORDER — DIPHENHYDRAMINE HCL 50 MG/ML IJ SOLN
25.0000 mg | Freq: Once | INTRAMUSCULAR | Status: AC
  Administered 2021-08-09: 25 mg via INTRAVENOUS
  Filled 2021-08-09: qty 1

## 2021-08-09 MED ORDER — SODIUM CHLORIDE 0.9 % IV SOLN
1.0000 mg/kg | Freq: Once | INTRAVENOUS | Status: AC
  Administered 2021-08-09: 100 mg via INTRAVENOUS
  Filled 2021-08-09: qty 20

## 2021-08-09 NOTE — Progress Notes (Signed)

## 2021-08-10 ENCOUNTER — Telehealth: Payer: Self-pay

## 2021-08-10 NOTE — Telephone Encounter (Signed)
-----   Message from Willis Modena, RN sent at 08/09/2021  1:17 PM EDT ----- Regarding: Dr. Alen Blew 1st time Opdivo/[facility] f/u tol well Dr. Alen Blew 1st time Opdivo/Y[facility] tolerated tx well. Pt call back due 08/10/21

## 2021-08-10 NOTE — Telephone Encounter (Signed)
LM for patient that this nurse was calling to see how they were doing after their treatment. Please call back to Dr. Hazeline Junker nurse at [phone] if they have any questions or concerns regarding the treatment.

## 2021-08-30 ENCOUNTER — Inpatient Hospital Stay: Payer: Medicare Other

## 2021-08-30 ENCOUNTER — Inpatient Hospital Stay: Payer: Medicare Other | Admitting: Oncology

## 2021-09-05 ENCOUNTER — Other Ambulatory Visit: Payer: Self-pay

## 2021-09-08 ENCOUNTER — Other Ambulatory Visit: Payer: Self-pay

## 2021-09-12 ENCOUNTER — Other Ambulatory Visit: Payer: Self-pay

## 2021-09-19 ENCOUNTER — Telehealth: Payer: Self-pay

## 2021-09-19 NOTE — Telephone Encounter (Signed)
Patient's sister-in-law called and stated that the patient was dc'd from a [facility] hospital and his PCP stated that he did not want the patient to receive any further treatments until he sees cardiology due to concern for CHF. Recommended that the SIL bring the patient to his appointment tomorrow to see Dr. Alen Blew while waiting for the cardiology appointment. Diane, SIL, stated that she would still like to cancel the appointments and will call our office once the patient follows up with cardiology. Dr. Alen Blew made aware.

## 2021-09-20 ENCOUNTER — Inpatient Hospital Stay: Payer: Medicare Other

## 2021-09-20 ENCOUNTER — Inpatient Hospital Stay: Payer: Medicare Other | Admitting: Oncology

## 2021-12-06 ENCOUNTER — Inpatient Hospital Stay
Admit: 2021-12-06 | Discharge: 2021-12-20 | Disposition: A | Discharge status: Other Acute Inpatient Hospital | Payer: Medicare Other | Source: Other Acute Inpatient Hospital

## 2021-12-06 DIAGNOSIS — I5032 Chronic diastolic (congestive) heart failure: Secondary | ICD-10-CM

## 2021-12-06 DIAGNOSIS — R1311 Dysphagia, oral phase: Secondary | ICD-10-CM

## 2021-12-06 DIAGNOSIS — J189 Pneumonia, unspecified organism: Secondary | ICD-10-CM

## 2021-12-06 DIAGNOSIS — J9621 Acute and chronic respiratory failure with hypoxia: Secondary | ICD-10-CM

## 2021-12-06 DIAGNOSIS — J449 Chronic obstructive pulmonary disease, unspecified: Secondary | ICD-10-CM

## 2021-12-07 ENCOUNTER — Other Ambulatory Visit (HOSPITAL_COMMUNITY): Payer: Medicare Other

## 2021-12-07 LAB — CBC
HCT: 41 % (ref 39.0–52.0)
Hemoglobin: 12.3 g/dL — ABNORMAL LOW (ref 13.0–17.0)
MCH: 27.5 pg (ref 26.0–34.0)
MCHC: 30 g/dL (ref 30.0–36.0)
MCV: 91.7 fL (ref 80.0–100.0)
Platelets: 141 10*3/uL — ABNORMAL LOW (ref 150–400)
RBC: 4.47 MIL/uL (ref 4.22–5.81)
RDW: 17.9 % — ABNORMAL HIGH (ref 11.5–15.5)
WBC: 6.3 10*3/uL (ref 4.0–10.5)
nRBC: 0 % (ref 0.0–0.2)

## 2021-12-07 LAB — BLOOD GAS, ARTERIAL
Acid-Base Excess: 19.5 mmol/L — ABNORMAL HIGH (ref 0.0–2.0)
Acid-Base Excess: 17.2 mmol/L — ABNORMAL HIGH (ref 0.0–2.0)
Acid-Base Excess: 18.7 mmol/L — ABNORMAL HIGH (ref 0.0–2.0)
Bicarbonate: 49.7 mmol/L — ABNORMAL HIGH (ref 20.0–28.0)
Bicarbonate: 48 mmol/L — ABNORMAL HIGH (ref 20.0–28.0)
Bicarbonate: 47.1 mmol/L — ABNORMAL HIGH (ref 20.0–28.0)
Drawn by: 164
O2 Saturation: 96.2 %
O2 Saturation: 100 %
O2 Saturation: 100 %
Patient temperature: 36.3
Patient temperature: 36.1
Patient temperature: 36.7
pCO2 arterial: 83 mmHg (ref 32–48)
pCO2 arterial: 88 mmHg (ref 32–48)
pCO2 arterial: 70 mmHg (ref 32–48)
pH, Arterial: 7.38 (ref 7.35–7.45)
pH, Arterial: 7.34 — ABNORMAL LOW (ref 7.35–7.45)
pH, Arterial: 7.43 (ref 7.35–7.45)
pO2, Arterial: 102 mmHg (ref 83–108)
pO2, Arterial: 92 mmHg (ref 83–108)
pO2, Arterial: 146 mmHg — ABNORMAL HIGH (ref 83–108)

## 2021-12-07 LAB — BASIC METABOLIC PANEL
Anion gap: 7 (ref 5–15)
BUN: 54 mg/dL — ABNORMAL HIGH (ref 8–23)
CO2: 40 mmol/L — ABNORMAL HIGH (ref 22–32)
Calcium: 8.7 mg/dL — ABNORMAL LOW (ref 8.9–10.3)
Chloride: 92 mmol/L — ABNORMAL LOW (ref 98–111)
Creatinine, Ser: 1.26 mg/dL — ABNORMAL HIGH (ref 0.61–1.24)
GFR, Estimated: >60 mL/min (ref >60)
Glucose, Bld: 80 mg/dL (ref 70–99)
Potassium: 4.7 mmol/L (ref 3.5–5.1)
Sodium: 139 mmol/L (ref 135–145)

## 2021-12-08 ENCOUNTER — Other Ambulatory Visit (HOSPITAL_COMMUNITY): Payer: Medicare Other

## 2021-12-08 DIAGNOSIS — J9621 Acute and chronic respiratory failure with hypoxia: Secondary | ICD-10-CM | POA: Diagnosis not present

## 2021-12-08 DIAGNOSIS — J449 Chronic obstructive pulmonary disease, unspecified: Secondary | ICD-10-CM | POA: Diagnosis not present

## 2021-12-08 DIAGNOSIS — J189 Pneumonia, unspecified organism: Secondary | ICD-10-CM | POA: Diagnosis not present

## 2021-12-08 DIAGNOSIS — I5032 Chronic diastolic (congestive) heart failure: Secondary | ICD-10-CM | POA: Diagnosis not present

## 2021-12-08 LAB — BLOOD GAS, ARTERIAL
Acid-Base Excess: 17.9 mmol/L — ABNORMAL HIGH (ref 0.0–2.0)
Bicarbonate: 43.9 mmol/L — ABNORMAL HIGH (ref 20.0–28.0)
O2 Saturation: 99.3 %
Patient temperature: 37
pCO2 arterial: 55 mmHg — ABNORMAL HIGH (ref 32–48)
pH, Arterial: 7.51 — ABNORMAL HIGH (ref 7.35–7.45)
pO2, Arterial: 199 mmHg — ABNORMAL HIGH (ref 83–108)

## 2021-12-08 LAB — MAGNESIUM: Magnesium: 2.4 mg/dL (ref 1.7–2.4)

## 2021-12-08 LAB — BASIC METABOLIC PANEL
Anion gap: 7 (ref 5–15)
BUN: 47 mg/dL — ABNORMAL HIGH (ref 8–23)
CO2: 36 mmol/L — ABNORMAL HIGH (ref 22–32)
Calcium: 8.6 mg/dL — ABNORMAL LOW (ref 8.9–10.3)
Chloride: 94 mmol/L — ABNORMAL LOW (ref 98–111)
Creatinine, Ser: 1.26 mg/dL — ABNORMAL HIGH (ref 0.61–1.24)
GFR, Estimated: >60 mL/min (ref >60)
Glucose, Bld: 75 mg/dL (ref 70–99)
Potassium: 4.6 mmol/L (ref 3.5–5.1)
Sodium: 137 mmol/L (ref 135–145)

## 2021-12-08 LAB — CBC
HCT: 40.3 % (ref 39.0–52.0)
Hemoglobin: 12.6 g/dL — ABNORMAL LOW (ref 13.0–17.0)
MCH: 28 pg (ref 26.0–34.0)
MCHC: 31.3 g/dL (ref 30.0–36.0)
MCV: 89.6 fL (ref 80.0–100.0)
Platelets: 149 10*3/uL — ABNORMAL LOW (ref 150–400)
RBC: 4.5 MIL/uL (ref 4.22–5.81)
RDW: 17.4 % — ABNORMAL HIGH (ref 11.5–15.5)
WBC: 7.5 10*3/uL (ref 4.0–10.5)
nRBC: 0 % (ref 0.0–0.2)

## 2021-12-08 LAB — TRIGLYCERIDES: Triglycerides: 120 mg/dL (ref <150)

## 2021-12-08 NOTE — Consult Note (Signed)
Pulmonary Critical Care Medicine [place]  PULMONARY SERVICE  Date of Service: 12/08/2021  PULMONARY CRITICAL CARE Kenneth Fischer  EPP:[id number]  DOB: 1950-09-24   DOA: 12/06/2021  Referring Physician: Satira Sark, MD  HPI: Kenneth Fischer is a 71 y.o. male seen for follow up of Acute on Chronic Respiratory Failure.  Patient has multiple medical problems including COPD DVT renal stones hypertension pneumonia renal cancer who presented to the hospital because of increasing shortness of breath.  Patient was noticed to have severe acidosis with a pH of 7.2 and was placed on BiPAP therapy at the time of evaluation chest x-ray was done which revealed lower lobe consolidation and also a pleural effusion.  Patient subsequently had cultures positive for MRSA patient was treated with antibiotics in addition to that was given Pulmicort formoterol and prednisone for the COPD.  Review of Systems:  ROS performed and is unremarkable other than noted above.  Past Medical History:  Diagnosis Date   Bronchitis    Cancer ([place])    right renal neoplasm   COPD (chronic obstructive pulmonary disease) (HCC)    DVT (deep vein thrombosis) in pregnancy    History of kidney stones    Hypertension    Pneumonia     Past Surgical History:  Procedure Laterality Date   INTERCOSTAL NERVE BLOCK Left 10/15/2020   Procedure: INTERCOSTAL NERVE BLOCK;  Surgeon: [name], MD;  Location: [facility];  Service: Thoracic;  Laterality: Left;   NODE DISSECTION Left 10/15/2020   Procedure: NODE DISSECTION;  Surgeon: [name], MD;  Location: [facility];  Service: Thoracic;  Laterality: Left;   ROBOTIC ASSITED PARTIAL NEPHRECTOMY Left 02/20/2019   Procedure: XI ROBOTIC ASSITED LAPAROSCOPIC ASSISTED RADICAL NEPHRECTOMY;  Surgeon: Raynelle Bring, MD;  Location: [facility];  Service: Urology;  Laterality: Left;   TONSILLECTOMY      Social History:    reports that he quit smoking about  33 years ago. His smoking use included cigarettes. He has a 20.00 pack-year smoking history. He has never used smokeless tobacco. He reports that he does not currently use alcohol. He reports that he does not use drugs.  Family History: Non-Contributory to the present illness  Allergies  Allergen Reactions   Codeine Hives    Nausea, vomiting, hives    Morphine And Related Hives    Hives, nausea and vomiting    Medications: Reviewed on Rounds  Physical Exam:  Vitals: Temperature is 97.3 pulse 59 respiratory 24 blood pressure 100/60 saturations 100%  Ventilator Settings assist-control FiO2 50% tidal volume 548 PEEP of 5  General: Comfortable at this time Eyes: Grossly normal lids, irises & conjunctiva ENT: grossly tongue is normal Neck: no obvious mass Cardiovascular: S1-S2 normal no gallop rub Respiratory: Diminished breath sounds few rhonchi Abdomen: Soft nontender Skin: no rash seen on limited exam Musculoskeletal: not rigid Psychiatric:unable to assess Neurologic: no seizure no involuntary movements         Labs on Admission:  Basic Metabolic Panel: Recent Labs  Lab 12/07/21 0432 12/08/21 0210  NA 139 137  K 4.7 4.6  CL 92* 94*  CO2 40* 36*  GLUCOSE 80 75  BUN 54* 47*  CREATININE 1.26* 1.26*  CALCIUM 8.7* 8.6*  MG  --  2.4    Recent Labs  Lab 12/07/21 0451 12/07/21 0843 12/07/21 1813 12/08/21 0620  PHART 7.38 7.34* 7.43 7.51*  PCO2ART 83* 88* 70* 55*  PO2ART 102 92 146* 199*  HCO3 49.7*  48.0* 47.1* 43.9*  O2SAT 96.2 100 100 99.3    Liver Function Tests: No results for input(s): "AST", "ALT", "ALKPHOS", "BILITOT", "PROT", "ALBUMIN" in the last 168 hours. No results for input(s): "LIPASE", "AMYLASE" in the last 168 hours. No results for input(s): "AMMONIA" in the last 168 hours.  CBC: Recent Labs  Lab 12/07/21 0432 12/08/21 0210  WBC 6.3 7.5  HGB 12.3* 12.6*  HCT 41.0 40.3  MCV 91.7 89.6  PLT 141* 149*    Cardiac Enzymes: No results  for input(s): "CKTOTAL", "CKMB", "CKMBINDEX", "TROPONINI" in the last 168 hours.  BNP (last 3 results) No results for input(s): "BNP" in the last 8760 hours.  ProBNP (last 3 results) No results for input(s): "PROBNP" in the last 8760 hours.   Radiological Exams on Admission: DG Chest Port 1 View  Result Date: 12/08/2021 CLINICAL DATA:  Respiratory failure. EXAM: PORTABLE CHEST 1 VIEW COMPARISON:  Radiographs 12/07/2021 and 02/24/2021.  CT 07/20/2021. FINDINGS: 0741 hours. Endotracheal tube tip is in the mid trachea. Enteric tube projects below the diaphragm, tip not visualized. The heart size and mediastinal contours are stable. There is underlying chronic lung disease with perihilar scarring and distortion bilaterally. The superimpose right-greater-than-left pleural effusions and bibasilar pulmonary opacity seen on yesterday's radiographs have not changed over the interval. No evidence of pneumothorax or acute osseous abnormality. IMPRESSION: No significant change from yesterday's radiographs. Persistent bibasilar pulmonary opacities and pleural effusions superimposed on chronic lung disease. Electronically Signed   By: Richardean Sale M.D.   On: 12/08/2021 08:37   DG Abd 1 View  Result Date: 12/08/2021 CLINICAL DATA:  Nausea and vomiting. EXAM: ABDOMEN - 1 VIEW COMPARISON:  CT 07/20/2021. FINDINGS: 0748 hours. Two supine views of the abdomen are submitted. Portions of the abdomen are excluded from this portable study. The visualized bowel gas pattern is normal. There is no supine evidence of bowel wall thickening or pneumoperitoneum. No suspicious abdominal calcifications or acute osseous findings are seen. There is mild lumbar spondylosis. IMPRESSION: No evidence of bowel obstruction or other acute findings. Portions of the abdomen are excluded from this study. Electronically Signed   By: Richardean Sale M.D.   On: 12/08/2021 08:35   DG Abd Portable 1V  Result Date: 12/07/2021 CLINICAL  DATA:  [id number] Impaired nasal gastric feeding tube, initial encounter [id number] EXAM: PORTABLE ABDOMEN - 1 VIEW, left upper abdomen collimated off view. COMPARISON:  None Available. FINDINGS: Endotracheal tube terminates 4 cm above the carina. Enteric tube coursing below the hemidiaphragm with tip and side port overlying the expected region of the gastric lumen. Patchy airspace opacities in the visualized lungs. Bilateral small volume pleural effusions, right greater than left. The bowel gas pattern is normal. No radio-opaque calculi or other significant radiographic abnormality are seen. IMPRESSION: 1. Endotracheal and enteric tubes in appropriate position. 2.  Patchy airspace opacities in the visualized lungs. 3. Bilateral small volume pleural effusions, right greater than left. 4. Nonobstructive bowel gas pattern of the visualized upper abdomen. Electronically Signed   By: Iven Finn M.D.   On: 12/07/2021 21:30   DG Chest Port 1 View  Result Date: 12/07/2021 CLINICAL DATA:  Intubated, history of renal cell carcinoma EXAM: PORTABLE CHEST 1 VIEW COMPARISON:  02/24/2021, 07/20/2021 FINDINGS: Single frontal view of the chest demonstrates endotracheal tube overlying tracheal air column, tip 2.5 cm above carina. Cardiac silhouette is stable. Since the prior CT, there is increasing bibasilar consolidation and effusion, right greater than left. Chronic bilateral perihilar scarring and calcified  mediastinal lymph nodes, compatible with sarcoidosis. No pneumothorax. Postsurgical changes from prior left lower lobe wedge resection. No acute bony abnormality. IMPRESSION: 1. Progressive bibasilar consolidation and effusions, right greater than left. 2. No complication after intubation. 3. Persistent findings compatible with sarcoidosis. Electronically Signed   By: Randa Ngo M.D.   On: 12/07/2021 17:39    Assessment/Plan Active Problems:   COPD, severe (HCC)   Acute on chronic respiratory failure with hypoxia  (HCC)   Oral phase dysphagia   Healthcare-associated pneumonia   Chronic heart failure with preserved ejection fraction (HCC)   Acute on chronic respiratory failure hypoxia we will place the patient on pressure support today and see how he does.  Hopefully will be able to quickly wean and extubate Severe COPD placed on Solu-Medrol also on DuoNebs as well as Pulmicort.  Continue with aggressive pulmonary toileting. Healthcare associated pneumonia patient has been treated with antibiotics had initially presented with MRSA pneumonia given vancomycin.  In addition appears to be at risk for aspiration Oral dysphagia continue with aspiration precautions and monitor closely. Chronic heart failure preserved ejection fraction monitor patient's fluid status and diuretics as needed.  I have personally seen and evaluated the patient, evaluated laboratory and imaging results, formulated the assessment and plan and placed orders. The Patient requires high complexity decision making with multiple systems involvement.  Case was discussed on Rounds with the Respiratory Therapy Director and the Respiratory staff Time Spent 29mnutes  Kenneth Keltner A Geanette Buonocore, MD FKidspeace Orchard Hills CampusPulmonary Critical Care Medicine Sleep Medicine

## 2021-12-09 ENCOUNTER — Encounter (HOSPITAL_COMMUNITY): Payer: Medicare Other | Admitting: Certified Registered Nurse Anesthetist

## 2021-12-09 ENCOUNTER — Other Ambulatory Visit (HOSPITAL_COMMUNITY): Payer: Medicare Other

## 2021-12-09 ENCOUNTER — Encounter: Payer: Self-pay | Admitting: Oncology

## 2021-12-09 DIAGNOSIS — I110 Hypertensive heart disease with heart failure: Secondary | ICD-10-CM

## 2021-12-09 DIAGNOSIS — J449 Chronic obstructive pulmonary disease, unspecified: Secondary | ICD-10-CM

## 2021-12-09 DIAGNOSIS — J9621 Acute and chronic respiratory failure with hypoxia: Secondary | ICD-10-CM

## 2021-12-09 DIAGNOSIS — R1311 Dysphagia, oral phase: Secondary | ICD-10-CM

## 2021-12-09 DIAGNOSIS — J189 Pneumonia, unspecified organism: Secondary | ICD-10-CM | POA: Diagnosis not present

## 2021-12-09 DIAGNOSIS — I5032 Chronic diastolic (congestive) heart failure: Secondary | ICD-10-CM

## 2021-12-09 MED ORDER — PROPOFOL 10 MG/ML IV BOLUS
INTRAVENOUS | Status: DC | PRN
  Administered 2021-12-09: 100 mg via INTRAVENOUS

## 2021-12-09 MED ORDER — SUCCINYLCHOLINE CHLORIDE 200 MG/10ML IV SOSY
PREFILLED_SYRINGE | INTRAVENOUS | Status: DC | PRN
  Administered 2021-12-09: 100 mg via INTRAVENOUS

## 2021-12-09 NOTE — Anesthesia Procedure Notes (Signed)
Procedure Name: Intubation Date/Time: 12/09/2021 10:39 AM  Performed by: Colin Benton, CRNAPre-anesthesia Checklist: Patient identified, Emergency Drugs available, Suction available and Patient being monitored Patient Re-evaluated:Patient Re-evaluated prior to induction Oxygen Delivery Method: Circle system utilized Preoxygenation: Pre-oxygenation with 100% oxygen Induction Type: IV induction Ventilation: Mask ventilation without difficulty and Oral airway inserted - appropriate to patient size Laryngoscope Size: Glidescope and 4 Grade View: Grade I Tube size: 7.5 mm Number of attempts: 1 Airway Equipment and Method: Oral airway, Rigid stylet and Video-laryngoscopy Placement Confirmation: ETT inserted through vocal cords under direct vision, positive ETCO2 and breath sounds checked- equal and bilateral Secured at: 22 cm Tube secured with: Tape Dental Injury: Teeth and Oropharynx as per pre-operative assessment

## 2021-12-09 NOTE — Progress Notes (Signed)
Pulmonary Critical Care Medicine [place]   PULMONARY CRITICAL CARE SERVICE  PROGRESS NOTE     Kenneth Fischer  CLE:[id number]  DOB: 1950-02-24   DOA: 12/06/2021  Referring Physician: Satira Sark, MD  HPI: Kenneth Fischer is a 71 y.o. male being followed for ventilator/airway/oxygen weaning Acute on Chronic Respiratory Failure.  Patient is comfortable without distress remains on assist control mode looks good awake and alert  Medications: Reviewed on Rounds  Physical Exam:  Vitals: Temperature is 96.6 pulse of 84 respiratory is 26 blood pressure 158/75 saturations 98%  Ventilator Settings assist-control FiO2 40% tidal volume 500 PEEP of 5  General: Comfortable at this time Neck: supple Cardiovascular: no malignant arrhythmias Respiratory: No rhonchi distant breath sounds Skin: no rash seen on limited exam Musculoskeletal: No gross abnormality Psychiatric:unable to assess Neurologic:no involuntary movements         Lab Data:   Basic Metabolic Panel: Recent Labs  Lab 12/07/21 0432 12/08/21 0210  NA 139 137  K 4.7 4.6  CL 92* 94*  CO2 40* 36*  GLUCOSE 80 75  BUN 54* 47*  CREATININE 1.26* 1.26*  CALCIUM 8.7* 8.6*  MG  --  2.4    ABG: Recent Labs  Lab 12/07/21 0451 12/07/21 0843 12/07/21 1813 12/08/21 0620  PHART 7.38 7.34* 7.43 7.51*  PCO2ART 83* 88* 70* 55*  PO2ART 102 92 146* 199*  HCO3 49.7* 48.0* 47.1* 43.9*  O2SAT 96.2 100 100 99.3    Liver Function Tests: No results for input(s): "AST", "ALT", "ALKPHOS", "BILITOT", "PROT", "ALBUMIN" in the last 168 hours. No results for input(s): "LIPASE", "AMYLASE" in the last 168 hours. No results for input(s): "AMMONIA" in the last 168 hours.  CBC: Recent Labs  Lab 12/07/21 0432 12/08/21 0210  WBC 6.3 7.5  HGB 12.3* 12.6*  HCT 41.0 40.3  MCV 91.7 89.6  PLT 141* 149*    Cardiac Enzymes: No results for input(s): "CKTOTAL", "CKMB", "CKMBINDEX", "TROPONINI" in the last 168  hours.  BNP (last 3 results) No results for input(s): "BNP" in the last 8760 hours.  ProBNP (last 3 results) No results for input(s): "PROBNP" in the last 8760 hours.  Radiological Exams: DG Abd 1 View  Result Date: 12/08/2021 CLINICAL DATA:  Orogastric tube placement. EXAM: ABDOMEN - 1 VIEW COMPARISON:  December 08, 2021 7:48 a.m. FINDINGS: RIGHT-sided effusion and basilar airspace disease. Patchy airspace disease at the LEFT lung base. Gastric tube in place tip in the antro pyloric stomach. Side port in the mid stomach. EKG leads project over the abdomen. Gaseous distension of small bowel loops is mild in the LEFT abdomen and there is gas in the colon. IMPRESSION: 1. Gastric tube in place with tip in the antropyloric stomach. 2. RIGHT-sided effusion and basilar airspace disease. 3. Patchy airspace disease at the LEFT lung base. 4. Query mild ileus, no signs of obstruction. Electronically Signed   By: Zetta Bills M.D.   On: 12/08/2021 13:56   DG Chest Port 1 View  Result Date: 12/08/2021 CLINICAL DATA:  Respiratory failure. EXAM: PORTABLE CHEST 1 VIEW COMPARISON:  Radiographs 12/07/2021 and 02/24/2021.  CT 07/20/2021. FINDINGS: 0741 hours. Endotracheal tube tip is in the mid trachea. Enteric tube projects below the diaphragm, tip not visualized. The heart size and mediastinal contours are stable. There is underlying chronic lung disease with perihilar scarring and distortion bilaterally. The superimpose right-greater-than-left pleural effusions and bibasilar pulmonary opacity seen on yesterday's radiographs have not changed over the interval. No evidence of pneumothorax  or acute osseous abnormality. IMPRESSION: No significant change from yesterday's radiographs. Persistent bibasilar pulmonary opacities and pleural effusions superimposed on chronic lung disease. Electronically Signed   By: Richardean Sale M.D.   On: 12/08/2021 08:37   DG Abd 1 View  Result Date: 12/08/2021 CLINICAL DATA:   Nausea and vomiting. EXAM: ABDOMEN - 1 VIEW COMPARISON:  CT 07/20/2021. FINDINGS: 0748 hours. Two supine views of the abdomen are submitted. Portions of the abdomen are excluded from this portable Fischer. The visualized bowel gas pattern is normal. There is no supine evidence of bowel wall thickening or pneumoperitoneum. No suspicious abdominal calcifications or acute osseous findings are seen. There is mild lumbar spondylosis. IMPRESSION: No evidence of bowel obstruction or other acute findings. Portions of the abdomen are excluded from this Fischer. Electronically Signed   By: Richardean Sale M.D.   On: 12/08/2021 08:35   DG Abd Portable 1V  Result Date: 12/07/2021 CLINICAL DATA:  [id number] Impaired nasal gastric feeding tube, initial encounter [id number] EXAM: PORTABLE ABDOMEN - 1 VIEW, left upper abdomen collimated off view. COMPARISON:  None Available. FINDINGS: Endotracheal tube terminates 4 cm above the carina. Enteric tube coursing below the hemidiaphragm with tip and side port overlying the expected region of the gastric lumen. Patchy airspace opacities in the visualized lungs. Bilateral small volume pleural effusions, right greater than left. The bowel gas pattern is normal. No radio-opaque calculi or other significant radiographic abnormality are seen. IMPRESSION: 1. Endotracheal and enteric tubes in appropriate position. 2.  Patchy airspace opacities in the visualized lungs. 3. Bilateral small volume pleural effusions, right greater than left. 4. Nonobstructive bowel gas pattern of the visualized upper abdomen. Electronically Signed   By: Iven Finn M.D.   On: 12/07/2021 21:30   DG Chest Port 1 View  Result Date: 12/07/2021 CLINICAL DATA:  Intubated, history of renal cell carcinoma EXAM: PORTABLE CHEST 1 VIEW COMPARISON:  02/24/2021, 07/20/2021 FINDINGS: Single frontal view of the chest demonstrates endotracheal tube overlying tracheal air column, tip 2.5 cm above carina. Cardiac silhouette is  stable. Since the prior CT, there is increasing bibasilar consolidation and effusion, right greater than left. Chronic bilateral perihilar scarring and calcified mediastinal lymph nodes, compatible with sarcoidosis. No pneumothorax. Postsurgical changes from prior left lower lobe wedge resection. No acute bony abnormality. IMPRESSION: 1. Progressive bibasilar consolidation and effusions, right greater than left. 2. No complication after intubation. 3. Persistent findings compatible with sarcoidosis. Electronically Signed   By: Randa Ngo M.D.   On: 12/07/2021 17:39    Assessment/Plan Active Problems:   COPD, severe (HCC)   Acute on chronic respiratory failure with hypoxia (HCC)   Oral phase dysphagia   Healthcare-associated pneumonia   Chronic heart failure with preserved ejection fraction (HCC)   Acute on chronic respiratory failure hypoxia plan is going to be to continue with the weaning process we will try pressure support trial today and see if patient is able to be extubated.  Patient remains orally intubated Severe COPD medical management we will continue to monitor and follow along closely. Oral dysphagia no change we will continue with supportive care Healthcare associated pneumonia has been treated Chronic heart failure preserved ejection fraction patient is at baseline   I have personally seen and evaluated the patient, evaluated laboratory and imaging results, formulated the assessment and plan and placed orders. The Patient requires high complexity decision making with multiple systems involvement.  Rounds were done with the Respiratory Therapy Director and Staff therapists and discussed with nursing  staff also.  Allyne Gee, MD [facility] Pulmonary Critical Care Medicine Sleep Medicine

## 2021-12-10 DIAGNOSIS — J189 Pneumonia, unspecified organism: Secondary | ICD-10-CM | POA: Diagnosis not present

## 2021-12-10 DIAGNOSIS — J449 Chronic obstructive pulmonary disease, unspecified: Secondary | ICD-10-CM | POA: Diagnosis not present

## 2021-12-10 DIAGNOSIS — J9621 Acute and chronic respiratory failure with hypoxia: Secondary | ICD-10-CM | POA: Diagnosis not present

## 2021-12-10 DIAGNOSIS — I5032 Chronic diastolic (congestive) heart failure: Secondary | ICD-10-CM | POA: Diagnosis not present

## 2021-12-10 LAB — CBC
HCT: 40.3 % (ref 39.0–52.0)
Hemoglobin: 12.5 g/dL — ABNORMAL LOW (ref 13.0–17.0)
MCH: 27.5 pg (ref 26.0–34.0)
MCHC: 31 g/dL (ref 30.0–36.0)
MCV: 88.6 fL (ref 80.0–100.0)
Platelets: 129 10*3/uL — ABNORMAL LOW (ref 150–400)
RBC: 4.55 MIL/uL (ref 4.22–5.81)
RDW: 17.4 % — ABNORMAL HIGH (ref 11.5–15.5)
WBC: 6.5 10*3/uL (ref 4.0–10.5)
nRBC: 0 % (ref 0.0–0.2)

## 2021-12-10 LAB — BLOOD GAS, ARTERIAL
Acid-Base Excess: 10.7 mmol/L — ABNORMAL HIGH (ref 0.0–2.0)
Bicarbonate: 37.2 mmol/L — ABNORMAL HIGH (ref 20.0–28.0)
O2 Saturation: 99.4 %
Patient temperature: 36.6
pCO2 arterial: 55 mmHg — ABNORMAL HIGH (ref 32–48)
pH, Arterial: 7.44 (ref 7.35–7.45)
pO2, Arterial: 92 mmHg (ref 83–108)

## 2021-12-10 LAB — BASIC METABOLIC PANEL
Anion gap: 5 (ref 5–15)
BUN: 28 mg/dL — ABNORMAL HIGH (ref 8–23)
CO2: 34 mmol/L — ABNORMAL HIGH (ref 22–32)
Calcium: 8.6 mg/dL — ABNORMAL LOW (ref 8.9–10.3)
Chloride: 100 mmol/L (ref 98–111)
Creatinine, Ser: 0.93 mg/dL (ref 0.61–1.24)
GFR, Estimated: >60 mL/min (ref >60)
Glucose, Bld: 142 mg/dL — ABNORMAL HIGH (ref 70–99)
Potassium: 3.8 mmol/L (ref 3.5–5.1)
Sodium: 139 mmol/L (ref 135–145)

## 2021-12-10 LAB — MAGNESIUM: Magnesium: 2.3 mg/dL (ref 1.7–2.4)

## 2021-12-11 ENCOUNTER — Other Ambulatory Visit (HOSPITAL_COMMUNITY): Payer: Medicare Other

## 2021-12-11 DIAGNOSIS — J189 Pneumonia, unspecified organism: Secondary | ICD-10-CM | POA: Diagnosis not present

## 2021-12-11 DIAGNOSIS — I5032 Chronic diastolic (congestive) heart failure: Secondary | ICD-10-CM | POA: Diagnosis not present

## 2021-12-11 DIAGNOSIS — J449 Chronic obstructive pulmonary disease, unspecified: Secondary | ICD-10-CM | POA: Diagnosis not present

## 2021-12-11 DIAGNOSIS — J9621 Acute and chronic respiratory failure with hypoxia: Secondary | ICD-10-CM | POA: Diagnosis not present

## 2021-12-11 LAB — BASIC METABOLIC PANEL
Anion gap: 7 (ref 5–15)
BUN: 38 mg/dL — ABNORMAL HIGH (ref 8–23)
CO2: 31 mmol/L (ref 22–32)
Calcium: 8.3 mg/dL — ABNORMAL LOW (ref 8.9–10.3)
Chloride: 101 mmol/L (ref 98–111)
Creatinine, Ser: 0.96 mg/dL (ref 0.61–1.24)
GFR, Estimated: >60 mL/min (ref >60)
Glucose, Bld: 124 mg/dL — ABNORMAL HIGH (ref 70–99)
Potassium: 3.9 mmol/L (ref 3.5–5.1)
Sodium: 139 mmol/L (ref 135–145)

## 2021-12-11 LAB — TRIGLYCERIDES: Triglycerides: 79 mg/dL (ref <150)

## 2021-12-11 MED ORDER — DIATRIZOATE MEGLUMINE & SODIUM 66-10 % PO SOLN
ORAL | Status: AC
  Filled 2021-12-11: qty 30

## 2021-12-11 NOTE — Progress Notes (Signed)
Pulmonary Critical Care Medicine [place]   PULMONARY CRITICAL CARE SERVICE  PROGRESS NOTE     Lando Alcalde  MOQ:[id number]  DOB: 08-24-1950   DOA: 12/06/2021  Referring Physician: Satira Sark, MD  HPI: [place] Gipe is a 72 y.o. male being followed for ventilator/airway/oxygen weaning Acute on Chronic Respiratory Failure.  Patient is back on the ventilator on assist control mode apparently his tube had become dislodged and patient was reintubated on the 27th  Medications: Reviewed on Rounds  Physical Exam:  Vitals: Temperature is 97.7 pulse of 90 respiratory 16 blood pressure is 116/68 saturation 97%  Ventilator Settings on assist control FiO2 is 50% tidal volume 500 PEEP of 5  General: Comfortable at this time Neck: supple Cardiovascular: no malignant arrhythmias Respiratory: Scattered rhonchi expansion is equal Skin: no rash seen on limited exam Musculoskeletal: No gross abnormality Psychiatric:unable to assess Neurologic:no involuntary movements         Lab Data:   Basic Metabolic Panel: Recent Labs  Lab 12/07/21 0432 12/08/21 0210 12/10/21 0127 12/11/21 0253  NA 139 137 139 139  K 4.7 4.6 3.8 3.9  CL 92* 94* 100 101  CO2 40* 36* 34* 31  GLUCOSE 80 75 142* 124*  BUN 54* 47* 28* 38*  CREATININE 1.26* 1.26* 0.93 0.96  CALCIUM 8.7* 8.6* 8.6* 8.3*  MG  --  2.4 2.3  --     ABG: Recent Labs  Lab 12/07/21 0451 12/07/21 0843 12/07/21 1813 12/08/21 0620 12/10/21 0640  PHART 7.38 7.34* 7.43 7.51* 7.44  PCO2ART 83* 88* 70* 55* 55*  PO2ART 102 92 146* 199* 92  HCO3 49.7* 48.0* 47.1* 43.9* 37.2*  O2SAT 96.2 100 100 99.3 99.4    Liver Function Tests: No results for input(s): "AST", "ALT", "ALKPHOS", "BILITOT", "PROT", "ALBUMIN" in the last 168 hours. No results for input(s): "LIPASE", "AMYLASE" in the last 168 hours. No results for input(s): "AMMONIA" in the last 168 hours.  CBC: Recent Labs  Lab 12/07/21 0432  12/08/21 0210 12/10/21 0127  WBC 6.3 7.5 6.5  HGB 12.3* 12.6* 12.5*  HCT 41.0 40.3 40.3  MCV 91.7 89.6 88.6  PLT 141* 149* 129*    Cardiac Enzymes: No results for input(s): "CKTOTAL", "CKMB", "CKMBINDEX", "TROPONINI" in the last 168 hours.  BNP (last 3 results) No results for input(s): "BNP" in the last 8760 hours.  ProBNP (last 3 results) No results for input(s): "PROBNP" in the last 8760 hours.  Radiological Exams: DG Chest 1 View  Result Date: 12/11/2021 CLINICAL DATA:  71 year old male with history of respiratory failure. EXAM: CHEST  1 VIEW COMPARISON:  Chest x-ray 12/09/2021. FINDINGS: Endotracheal tube has been withdrawn substantially, now with tip 6.5 cm above the thoracic inlet and 14.6 cm above the level of the carina. Hypopharyngeal distension is noted. A nasogastric tube is seen extending into the stomach, however, the tip of the nasogastric tube extends below the lower margin of the image. Patient is severely rotated to the left, causing distortion of cardiomediastinal structures. Lung bases are incompletely imaged. With these limitations in mind, there is extensive opacity throughout the left mid to lower lung which may reflect atelectasis and/or consolidation, along with a superimposed moderate left pleural effusion. Patchy irregular opacities are also noted throughout the right lung, similar to prior examinations, likely chronic and related to systemic disease such as sarcoidosis. Probable moderate right pleural effusion partially imaged. No evidence of pulmonary edema. Cardiac silhouette is obscured. IMPRESSION: 1. Support apparatus, as above. This patient  appears essentially extubated, and repositioning of the endotracheal tube is strongly recommended at this time. 2. Worsening aeration in the lungs, particularly in the left mid to lower lung, compatible with increasing atelectasis and/or consolidation and increasing moderate left pleural effusion. These results will be  called to the ordering clinician or representative by the Radiologist Assistant, and communication documented in the PACS or [employer]. Electronically Signed   By: Vinnie Langton M.D.   On: 12/11/2021 07:23   DG Abd 1 View  Result Date: 12/09/2021 CLINICAL DATA:  Enteric catheter placement EXAM: ABDOMEN - 1 VIEW COMPARISON:  12/08/2021 FINDINGS: Frontal view of the lower chest and upper abdomen was performed, excluding the right flank by collimation. Enteric catheter passes below diaphragm tip and side port projecting over the gastric fundus. Stable retained stool throughout the colon. Otherwise unremarkable bowel gas pattern. Patchy consolidation at the lung bases again noted. IMPRESSION: 1. Enteric catheter tip projecting over the gastric fundus. Electronically Signed   By: Randa Ngo M.D.   On: 12/09/2021 18:59   DG Chest Port 1 View  Result Date: 12/09/2021 CLINICAL DATA:  Provided history: ET tube. EXAM: PORTABLE CHEST 1 VIEW COMPARISON:  Prior chest radiographs 12/08/2021 and earlier. FINDINGS: ET tube present with tip terminating 5.4 cm above the level of the carina. Cardiomediastinal silhouette unchanged. Similar appearance of right greater than left pleural effusions. Similar appearance of bibasilar opacities. Underlying chronic lung disease with perihilar scarring and distortion bilaterally. Postsurgical changes within the left lung base no evidence of pneumothorax. No acute bony abnormality identified. IMPRESSION: ET tube present with tip terminating 5.4 cm above the level the carina. Otherwise, no significant interval change from yesterday's chest radiograph. Right greater than left pleural effusions. Bibasilar opacities which may reflect atelectasis, pneumonia and/or edema. Background chronic lung disease. Electronically Signed   By: Kellie Simmering D.O.   On: 12/09/2021 11:34    Assessment/Plan Active Problems:   COPD, severe (HCC)   Acute on chronic respiratory failure with  hypoxia (HCC)   Oral phase dysphagia   Healthcare-associated pneumonia   Chronic heart failure with preserved ejection fraction (HCC)   Acute on chronic respiratory failure with hypoxia plan is going to be to continue with full support currently is on assist control mode Severe COPD medical management we will continue to follow along Oral dysphagia no change Healthcare associated pneumonia has been treated Chronic heart failure preserved ejection fraction no change we will continue to follow along   I have personally seen and evaluated the patient, evaluated laboratory and imaging results, formulated the assessment and plan and placed orders. The Patient requires high complexity decision making with multiple systems involvement.  Rounds were done with the Respiratory Therapy Director and Staff therapists and discussed with nursing staff also.  Allyne Gee, MD [facility] Pulmonary Critical Care Medicine Sleep Medicine

## 2021-12-12 DIAGNOSIS — J9621 Acute and chronic respiratory failure with hypoxia: Secondary | ICD-10-CM | POA: Diagnosis not present

## 2021-12-12 DIAGNOSIS — J189 Pneumonia, unspecified organism: Secondary | ICD-10-CM | POA: Diagnosis not present

## 2021-12-12 DIAGNOSIS — I5032 Chronic diastolic (congestive) heart failure: Secondary | ICD-10-CM | POA: Diagnosis not present

## 2021-12-12 DIAGNOSIS — J449 Chronic obstructive pulmonary disease, unspecified: Secondary | ICD-10-CM | POA: Diagnosis not present

## 2021-12-12 LAB — BASIC METABOLIC PANEL
Anion gap: 7 (ref 5–15)
BUN: 41 mg/dL — ABNORMAL HIGH (ref 8–23)
CO2: 31 mmol/L (ref 22–32)
Calcium: 8.3 mg/dL — ABNORMAL LOW (ref 8.9–10.3)
Chloride: 102 mmol/L (ref 98–111)
Creatinine, Ser: 0.96 mg/dL (ref 0.61–1.24)
GFR, Estimated: >60 mL/min (ref >60)
Glucose, Bld: 144 mg/dL — ABNORMAL HIGH (ref 70–99)
Potassium: 3.8 mmol/L (ref 3.5–5.1)
Sodium: 140 mmol/L (ref 135–145)

## 2021-12-12 LAB — MAGNESIUM: Magnesium: 2.3 mg/dL (ref 1.7–2.4)

## 2021-12-12 LAB — CBC
HCT: 35.7 % — ABNORMAL LOW (ref 39.0–52.0)
Hemoglobin: 11 g/dL — ABNORMAL LOW (ref 13.0–17.0)
MCH: 27.2 pg (ref 26.0–34.0)
MCHC: 30.8 g/dL (ref 30.0–36.0)
MCV: 88.4 fL (ref 80.0–100.0)
Platelets: 126 10*3/uL — ABNORMAL LOW (ref 150–400)
RBC: 4.04 MIL/uL — ABNORMAL LOW (ref 4.22–5.81)
RDW: 18.2 % — ABNORMAL HIGH (ref 11.5–15.5)
WBC: 6.6 10*3/uL (ref 4.0–10.5)
nRBC: 0 % (ref 0.0–0.2)

## 2021-12-12 LAB — CULTURE, RESPIRATORY W GRAM STAIN

## 2021-12-12 NOTE — Progress Notes (Signed)
Pulmonary Critical Care Medicine [place]   PULMONARY CRITICAL CARE SERVICE  PROGRESS NOTE     Kenneth Fischer  WIO:[id number]  DOB: 02-25-1950   DOA: 12/06/2021  Referring Physician: Satira Sark, MD  HPI: Kenneth Fischer is a 71 y.o. male being followed for ventilator/airway/oxygen weaning Acute on Chronic Respiratory Failure.  Patient is on full support on the ventilator on assist control also on propofol  Medications: Reviewed on Rounds  Physical Exam:  Vitals: Temperature is 96.3 pulse of 40 respiratory 19 blood pressure 122/62 saturations 98%  Ventilator Settings assist control FiO2 40% tidal volume 500 PEEP of 5  General: Comfortable at this time Neck: supple Cardiovascular: no malignant arrhythmias Respiratory: No rhonchi no rales are noted at this time Skin: no rash seen on limited exam Musculoskeletal: No gross abnormality Psychiatric:unable to assess Neurologic:no involuntary movements         Lab Data:   Basic Metabolic Panel: Recent Labs  Lab 12/07/21 0432 12/08/21 0210 12/10/21 0127 12/11/21 0253 12/12/21 0126  NA 139 137 139 139 140  K 4.7 4.6 3.8 3.9 3.8  CL 92* 94* 100 101 102  CO2 40* 36* 34* 31 31  GLUCOSE 80 75 142* 124* 144*  BUN 54* 47* 28* 38* 41*  CREATININE 1.26* 1.26* 0.93 0.96 0.96  CALCIUM 8.7* 8.6* 8.6* 8.3* 8.3*  MG  --  2.4 2.3  --  2.3    ABG: Recent Labs  Lab 12/07/21 0451 12/07/21 0843 12/07/21 1813 12/08/21 0620 12/10/21 0640  PHART 7.38 7.34* 7.43 7.51* 7.44  PCO2ART 83* 88* 70* 55* 55*  PO2ART 102 92 146* 199* 92  HCO3 49.7* 48.0* 47.1* 43.9* 37.2*  O2SAT 96.2 100 100 99.3 99.4    Liver Function Tests: No results for input(s): "AST", "ALT", "ALKPHOS", "BILITOT", "PROT", "ALBUMIN" in the last 168 hours. No results for input(s): "LIPASE", "AMYLASE" in the last 168 hours. No results for input(s): "AMMONIA" in the last 168 hours.  CBC: Recent Labs  Lab 12/07/21 0432 12/08/21 0210  12/10/21 0127 12/12/21 0126  WBC 6.3 7.5 6.5 6.6  HGB 12.3* 12.6* 12.5* 11.0*  HCT 41.0 40.3 40.3 35.7*  MCV 91.7 89.6 88.6 88.4  PLT 141* 149* 129* 126*    Cardiac Enzymes: No results for input(s): "CKTOTAL", "CKMB", "CKMBINDEX", "TROPONINI" in the last 168 hours.  BNP (last 3 results) No results for input(s): "BNP" in the last 8760 hours.  ProBNP (last 3 results) No results for input(s): "PROBNP" in the last 8760 hours.  Radiological Exams: DG Abd Portable 1V  Result Date: 12/11/2021 CLINICAL DATA:  Nasogastric tube placement. EXAM: PORTABLE ABDOMEN - 1 VIEW COMPARISON:  12/09/2021. FINDINGS: No nasogastric tube on the included field of view. Persistent bilateral lung base opacities consistent with pleural effusions with atelectasis/infection. Normal bowel gas pattern. IMPRESSION: No visualized nasal/orogastric tube. Electronically Signed   By: Lajean Manes M.D.   On: 12/11/2021 13:20   DG Chest Port 1 View  Result Date: 12/11/2021 CLINICAL DATA:  Endotracheal and nasogastric tube. EXAM: PORTABLE CHEST 1 VIEW COMPARISON:  12/11/2021 at 6:50 a.m. FINDINGS: Endotracheal tube tip now lies 3.7 cm above the carina, well positioned. Nasogastric tube tip projects in the neck, to the left of midline. Bilateral perihilar and lung base interstitial and hazy airspace lung opacities, without significant change from the exam obtained earlier today. Small effusions. No pneumothorax. IMPRESSION: 1. Nasal/orogastric tube is malposition, tip projecting within the neck, to the left of midline. 2. Well-positioned endotracheal tube. 3.  Persistent lung opacities, unchanged from the exam obtained earlier today. No pneumothorax. Electronically Signed   By: Lajean Manes M.D.   On: 12/11/2021 13:19   DG Chest 1 View  Result Date: 12/11/2021 CLINICAL DATA:  71 year old male with history of respiratory failure. EXAM: CHEST  1 VIEW COMPARISON:  Chest x-ray 12/09/2021. FINDINGS: Endotracheal tube has been  withdrawn substantially, now with tip 6.5 cm above the thoracic inlet and 14.6 cm above the level of the carina. Hypopharyngeal distension is noted. A nasogastric tube is seen extending into the stomach, however, the tip of the nasogastric tube extends below the lower margin of the image. Patient is severely rotated to the left, causing distortion of cardiomediastinal structures. Lung bases are incompletely imaged. With these limitations in mind, there is extensive opacity throughout the left mid to lower lung which may reflect atelectasis and/or consolidation, along with a superimposed moderate left pleural effusion. Patchy irregular opacities are also noted throughout the right lung, similar to prior examinations, likely chronic and related to systemic disease such as sarcoidosis. Probable moderate right pleural effusion partially imaged. No evidence of pulmonary edema. Cardiac silhouette is obscured. IMPRESSION: 1. Support apparatus, as above. This patient appears essentially extubated, and repositioning of the endotracheal tube is strongly recommended at this time. 2. Worsening aeration in the lungs, particularly in the left mid to lower lung, compatible with increasing atelectasis and/or consolidation and increasing moderate left pleural effusion. These results will be called to the ordering clinician or representative by the Radiologist Assistant, and communication documented in the PACS or [employer]. Electronically Signed   By: Vinnie Langton M.D.   On: 12/11/2021 07:23    Assessment/Plan Active Problems:   COPD, severe (HCC)   Acute on chronic respiratory failure with hypoxia (HCC)   Oral phase dysphagia   Healthcare-associated pneumonia   Chronic heart failure with preserved ejection fraction (HCC)   Acute on chronic respiratory failure hypoxia patient is sedated on propofol titrated back down propofol may be also contributing to bradycardia Severe COPD medical management supportive  care Oral dysphagia right now is orally intubated Healthcare associated pneumonia treated patient has some worsening however on the last film could be fluid versus pneumonia Chronic heart failure preserved ejection fraction no change we will continue to follow along patient has some worsening noted with pleural effusion   I have personally seen and evaluated the patient, evaluated laboratory and imaging results, formulated the assessment and plan and placed orders. The Patient requires high complexity decision making with multiple systems involvement.  Rounds were done with the Respiratory Therapy Director and Staff therapists and discussed with nursing staff also.  Allyne Gee, MD [facility] Pulmonary Critical Care Medicine Sleep Medicine

## 2021-12-13 ENCOUNTER — Other Ambulatory Visit (HOSPITAL_COMMUNITY): Payer: Medicare Other

## 2021-12-13 DIAGNOSIS — J189 Pneumonia, unspecified organism: Secondary | ICD-10-CM | POA: Diagnosis not present

## 2021-12-13 DIAGNOSIS — J449 Chronic obstructive pulmonary disease, unspecified: Secondary | ICD-10-CM | POA: Diagnosis not present

## 2021-12-13 DIAGNOSIS — J9621 Acute and chronic respiratory failure with hypoxia: Secondary | ICD-10-CM | POA: Diagnosis not present

## 2021-12-13 DIAGNOSIS — I5032 Chronic diastolic (congestive) heart failure: Secondary | ICD-10-CM | POA: Diagnosis not present

## 2021-12-13 MED ORDER — IOHEXOL 300 MG/ML  SOLN
50.0000 mL | Freq: Once | INTRAMUSCULAR | Status: AC | PRN
  Administered 2021-12-13: 10 mL

## 2021-12-13 MED ORDER — LIDOCAINE VISCOUS HCL 2 % MT SOLN
OROMUCOSAL | Status: AC
  Administered 2021-12-13: 3 mL via NASAL
  Filled 2021-12-13: qty 15

## 2021-12-13 NOTE — Progress Notes (Signed)
Pulmonary Critical Care Medicine [place]   PULMONARY CRITICAL CARE SERVICE  PROGRESS NOTE     Kenneth Fischer  ION:[id number]  DOB: Jul 26, 1950   DOA: 12/06/2021  Referring Physician: Satira Sark, MD  HPI: Kenneth Fischer is a 71 y.o. male being followed for ventilator/airway/oxygen weaning Acute on Chronic Respiratory Failure.  Patient is on the ventilator full support still on propofol having issues with bradycardia  Medications: Reviewed on Rounds  Physical Exam:  Vitals: Temperature is 96.8 pulse of 40 respiratory rate 14 blood pressure is 150/90 saturations 98%  Ventilator Settings on assist control FiO2 is 40% tidal volume 500 PEEP of 5  General: Comfortable at this time Neck: supple Cardiovascular: no malignant arrhythmias Respiratory: Scattered rhonchi expansion is equal Skin: no rash seen on limited exam Musculoskeletal: No gross abnormality Psychiatric:unable to assess Neurologic:no involuntary movements         Lab Data:   Basic Metabolic Panel: Recent Labs  Lab 12/07/21 0432 12/08/21 0210 12/10/21 0127 12/11/21 0253 12/12/21 0126  NA 139 137 139 139 140  K 4.7 4.6 3.8 3.9 3.8  CL 92* 94* 100 101 102  CO2 40* 36* 34* 31 31  GLUCOSE 80 75 142* 124* 144*  BUN 54* 47* 28* 38* 41*  CREATININE 1.26* 1.26* 0.93 0.96 0.96  CALCIUM 8.7* 8.6* 8.6* 8.3* 8.3*  MG  --  2.4 2.3  --  2.3    ABG: Recent Labs  Lab 12/07/21 0451 12/07/21 0843 12/07/21 1813 12/08/21 0620 12/10/21 0640  PHART 7.38 7.34* 7.43 7.51* 7.44  PCO2ART 83* 88* 70* 55* 55*  PO2ART 102 92 146* 199* 92  HCO3 49.7* 48.0* 47.1* 43.9* 37.2*  O2SAT 96.2 100 100 99.3 99.4    Liver Function Tests: No results for input(s): "AST", "ALT", "ALKPHOS", "BILITOT", "PROT", "ALBUMIN" in the last 168 hours. No results for input(s): "LIPASE", "AMYLASE" in the last 168 hours. No results for input(s): "AMMONIA" in the last 168 hours.  CBC: Recent Labs  Lab 12/07/21 0432  12/08/21 0210 12/10/21 0127 12/12/21 0126  WBC 6.3 7.5 6.5 6.6  HGB 12.3* 12.6* 12.5* 11.0*  HCT 41.0 40.3 40.3 35.7*  MCV 91.7 89.6 88.6 88.4  PLT 141* 149* 129* 126*    Cardiac Enzymes: No results for input(s): "CKTOTAL", "CKMB", "CKMBINDEX", "TROPONINI" in the last 168 hours.  BNP (last 3 results) No results for input(s): "BNP" in the last 8760 hours.  ProBNP (last 3 results) No results for input(s): "PROBNP" in the last 8760 hours.  Radiological Exams: DG Abd Portable 1V  Result Date: 12/11/2021 CLINICAL DATA:  Nasogastric tube placement. EXAM: PORTABLE ABDOMEN - 1 VIEW COMPARISON:  12/09/2021. FINDINGS: No nasogastric tube on the included field of view. Persistent bilateral lung base opacities consistent with pleural effusions with atelectasis/infection. Normal bowel gas pattern. IMPRESSION: No visualized nasal/orogastric tube. Electronically Signed   By: Lajean Manes M.D.   On: 12/11/2021 13:20   DG Chest Port 1 View  Result Date: 12/11/2021 CLINICAL DATA:  Endotracheal and nasogastric tube. EXAM: PORTABLE CHEST 1 VIEW COMPARISON:  12/11/2021 at 6:50 a.m. FINDINGS: Endotracheal tube tip now lies 3.7 cm above the carina, well positioned. Nasogastric tube tip projects in the neck, to the left of midline. Bilateral perihilar and lung base interstitial and hazy airspace lung opacities, without significant change from the exam obtained earlier today. Small effusions. No pneumothorax. IMPRESSION: 1. Nasal/orogastric tube is malposition, tip projecting within the neck, to the left of midline. 2. Well-positioned endotracheal tube. 3.  Persistent lung opacities, unchanged from the exam obtained earlier today. No pneumothorax. Electronically Signed   By: Lajean Manes M.D.   On: 12/11/2021 13:19    Assessment/Plan Active Problems:   COPD, severe (HCC)   Acute on chronic respiratory failure with hypoxia (HCC)   Oral phase dysphagia   Healthcare-associated pneumonia   Chronic heart  failure with preserved ejection fraction (HCC)   Acute on chronic respiratory failure hypoxia we will continue with assist control mode patient's sedation needs to be changed overall we will go ahead and try to switch over to Precedex Severe COPD medical management we will continue to follow along closely On dysphagia remains at risk for aspiration Healthcare associated pneumonia has been treated with antibiotics Chronic heart failure preserved ejection fraction supportive care change sedation over   I have personally seen and evaluated the patient, evaluated laboratory and imaging results, formulated the assessment and plan and placed orders. The Patient requires high complexity decision making with multiple systems involvement.  Rounds were done with the Respiratory Therapy Director and Staff therapists and discussed with nursing staff also.  Allyne Gee, MD [facility] Pulmonary Critical Care Medicine Sleep Medicine

## 2021-12-14 ENCOUNTER — Other Ambulatory Visit (HOSPITAL_COMMUNITY): Payer: Medicare Other

## 2021-12-14 DIAGNOSIS — J9621 Acute and chronic respiratory failure with hypoxia: Secondary | ICD-10-CM | POA: Diagnosis not present

## 2021-12-14 DIAGNOSIS — I5032 Chronic diastolic (congestive) heart failure: Secondary | ICD-10-CM | POA: Diagnosis not present

## 2021-12-14 DIAGNOSIS — J189 Pneumonia, unspecified organism: Secondary | ICD-10-CM | POA: Diagnosis not present

## 2021-12-14 DIAGNOSIS — J449 Chronic obstructive pulmonary disease, unspecified: Secondary | ICD-10-CM | POA: Diagnosis not present

## 2021-12-14 LAB — BASIC METABOLIC PANEL
Anion gap: 7 (ref 5–15)
BUN: 29 mg/dL — ABNORMAL HIGH (ref 8–23)
CO2: 30 mmol/L (ref 22–32)
Calcium: 8.3 mg/dL — ABNORMAL LOW (ref 8.9–10.3)
Chloride: 104 mmol/L (ref 98–111)
Creatinine, Ser: 0.69 mg/dL (ref 0.61–1.24)
GFR, Estimated: >60 mL/min (ref >60)
Glucose, Bld: 110 mg/dL — ABNORMAL HIGH (ref 70–99)
Potassium: 3.6 mmol/L (ref 3.5–5.1)
Sodium: 141 mmol/L (ref 135–145)

## 2021-12-14 LAB — TRIGLYCERIDES: Triglycerides: 77 mg/dL (ref <150)

## 2021-12-14 NOTE — Progress Notes (Signed)
Pulmonary Critical Care Medicine [place]   PULMONARY CRITICAL CARE SERVICE  PROGRESS NOTE     Kenneth Fischer  ERD:[id number]  DOB: [birth year]   DOA: 12/06/2021  Referring Physician: Satira Sark, MD  HPI: Kenneth Fischer is a 71 y.o. male being followed for ventilator/airway/oxygen weaning Acute on Chronic Respiratory Failure.  Patient is currently on assist control mode is comfortable without distress has been failing attempts at pressure support probably looking at having to do a tracheostomy  Medications: Reviewed on Rounds  Physical Exam:  Vitals: Temperature is 97.4 pulse of 48 respiratory rate 18 blood pressure is 150/73 saturations 99%  Ventilator Settings patient is on assist control FiO2 35% tidal volume is 500 PEEP of 5  General: Comfortable at this time Neck: supple Cardiovascular: no malignant arrhythmias Respiratory: No rhonchi very coarse breath sounds Skin: no rash seen on limited exam Musculoskeletal: No gross abnormality Psychiatric:unable to assess Neurologic:no involuntary movements         Lab Data:   Basic Metabolic Panel: Recent Labs  Lab 12/08/21 0210 12/10/21 0127 12/11/21 0253 12/12/21 0126 12/14/21 0218  NA 137 139 139 140 141  K 4.6 3.8 3.9 3.8 3.6  CL 94* 100 101 102 104  CO2 36* 34* '31 31 30  '$ GLUCOSE 75 142* 124* 144* 110*  BUN 47* 28* 38* 41* 29*  CREATININE 1.26* 0.93 0.96 0.96 0.69  CALCIUM 8.6* 8.6* 8.3* 8.3* 8.3*  MG 2.4 2.3  --  2.3  --     ABG: Recent Labs  Lab 12/07/21 1813 12/08/21 0620 12/10/21 0640  PHART 7.43 7.51* 7.44  PCO2ART 70* 55* 55*  PO2ART 146* 199* 92  HCO3 47.1* 43.9* 37.2*  O2SAT 100 99.3 99.4    Liver Function Tests: No results for input(s): "AST", "ALT", "ALKPHOS", "BILITOT", "PROT", "ALBUMIN" in the last 168 hours. No results for input(s): "LIPASE", "AMYLASE" in the last 168 hours. No results for input(s): "AMMONIA" in the last 168 hours.  CBC: Recent Labs  Lab  12/08/21 0210 12/10/21 0127 12/12/21 0126  WBC 7.5 6.5 6.6  HGB 12.6* 12.5* 11.0*  HCT 40.3 40.3 35.7*  MCV 89.6 88.6 88.4  PLT 149* 129* 126*    Cardiac Enzymes: No results for input(s): "CKTOTAL", "CKMB", "CKMBINDEX", "TROPONINI" in the last 168 hours.  BNP (last 3 results) No results for input(s): "BNP" in the last 8760 hours.  ProBNP (last 3 results) No results for input(s): "PROBNP" in the last 8760 hours.  Radiological Exams: DG Abd 1 View  Result Date: 12/13/2021 CLINICAL DATA:  Fluoroscopic assistance for placement of feeding tube EXAM: ABDOMEN - 1 VIEW COMPARISON:  Abdomen radiograph done on 12/11/2021 FINDINGS: Fluoroscopic image shows tip of feeding tube in region of proximal duodenum. Fluoroscopic time 2 minutes and 12 seconds. Radiation dose 22 mGy. IMPRESSION: Fluoroscopic assistance was provided for placement of feeding tube. Electronically Signed   By: Elmer Picker M.D.   On: 12/13/2021 12:58    Assessment/Plan Active Problems:   COPD, severe (HCC)   Acute on chronic respiratory failure with hypoxia (HCC)   Oral phase dysphagia   Healthcare-associated pneumonia   Chronic heart failure with preserved ejection fraction (HCC)   Acute on chronic respiratory failure hypoxia we will continue with full support on the ventilator. Severe COPD medical management Oral dysphagia remains at risk for aspiration right now is n.p.o. Healthcare associated pneumonia has been treated we will continue to monitor follow-up x-rays Chronic heart failure preserved ejection fraction appears to be  compensated   I have personally seen and evaluated the patient, evaluated laboratory and imaging results, formulated the assessment and plan and placed orders. The Patient requires high complexity decision making with multiple systems involvement.  Rounds were done with the Respiratory Therapy Director and Staff therapists and discussed with nursing staff also.  Allyne Gee, MD  [facility] Pulmonary Critical Care Medicine Sleep Medicine

## 2021-12-15 ENCOUNTER — Other Ambulatory Visit (HOSPITAL_COMMUNITY): Payer: Medicare Other

## 2021-12-15 DIAGNOSIS — J189 Pneumonia, unspecified organism: Secondary | ICD-10-CM | POA: Diagnosis not present

## 2021-12-15 DIAGNOSIS — J449 Chronic obstructive pulmonary disease, unspecified: Secondary | ICD-10-CM | POA: Diagnosis not present

## 2021-12-15 DIAGNOSIS — J9621 Acute and chronic respiratory failure with hypoxia: Secondary | ICD-10-CM | POA: Diagnosis not present

## 2021-12-15 DIAGNOSIS — I5032 Chronic diastolic (congestive) heart failure: Secondary | ICD-10-CM | POA: Diagnosis not present

## 2021-12-15 LAB — ECHOCARDIOGRAM COMPLETE
AR max vel: 5.23 cm2
AV Area VTI: 4.97 cm2
AV Area mean vel: 4.82 cm2
AV Mean grad: 3 mmHg
AV Peak grad: 5.3 mmHg
Ao pk vel: 1.15 m/s
Area-P 1/2: 2.66 cm2
S' Lateral: 3.9 cm

## 2021-12-15 NOTE — Progress Notes (Addendum)
Pulmonary Critical Care Medicine [place]   PULMONARY CRITICAL CARE SERVICE  PROGRESS NOTE     Kenneth Fischer  HWE:[id number]  DOB: 01-27-1951   DOA: 12/06/2021  Referring Physician: Satira Sark, MD  HPI: Kenneth Fischer is a 71 y.o. male being followed for ventilator/airway/oxygen weaning Acute on Chronic Respiratory Failure.  Patient seen lying in bed, ET tube in place, 7.5, 24 at the lip.  Currently on propofol drip.  Were not going to wean today as he self decannulated yesterday.  Currently in bilateral wrist restraints.  Does appear to have more oral secretions and requiring frequent suctioning.  Medications: Reviewed on Rounds  Physical Exam:  Vitals: Temp 96.9, pulse 48, respirations 17, BP 160/83, SPO2 94%  Ventilator Settings AC VC plus, FiO2 28%, tidal volume 500, rate 16, PEEP 5  General: Comfortable at this time Neck: supple Cardiovascular: no malignant arrhythmias Respiratory: Bilaterally coarse Skin: no rash seen on limited exam Musculoskeletal: No gross abnormality Psychiatric:unable to assess Neurologic:no involuntary movements         Lab Data:   Basic Metabolic Panel: Recent Labs  Lab 12/10/21 0127 12/11/21 0253 12/12/21 0126 12/14/21 0218  NA 139 139 140 141  K 3.8 3.9 3.8 3.6  CL 100 101 102 104  CO2 34* '31 31 30  '$ GLUCOSE 142* 124* 144* 110*  BUN 28* 38* 41* 29*  CREATININE 0.93 0.96 0.96 0.69  CALCIUM 8.6* 8.3* 8.3* 8.3*  MG 2.3  --  2.3  --     ABG: Recent Labs  Lab 12/10/21 0640  PHART 7.44  PCO2ART 55*  PO2ART 92  HCO3 37.2*  O2SAT 99.4    Liver Function Tests: No results for input(s): "AST", "ALT", "ALKPHOS", "BILITOT", "PROT", "ALBUMIN" in the last 168 hours. No results for input(s): "LIPASE", "AMYLASE" in the last 168 hours. No results for input(s): "AMMONIA" in the last 168 hours.  CBC: Recent Labs  Lab 12/10/21 0127 12/12/21 0126  WBC 6.5 6.6  HGB 12.5* 11.0*  HCT 40.3 35.7*  MCV 88.6  88.4  PLT 129* 126*    Cardiac Enzymes: No results for input(s): "CKTOTAL", "CKMB", "CKMBINDEX", "TROPONINI" in the last 168 hours.  BNP (last 3 results) No results for input(s): "BNP" in the last 8760 hours.  ProBNP (last 3 results) No results for input(s): "PROBNP" in the last 8760 hours.  Radiological Exams: DG CHEST PORT 1 VIEW  Result Date: 12/14/2021 CLINICAL DATA:  Pleural effusion EXAM: PORTABLE CHEST 1 VIEW COMPARISON:  Radiograph 12/11/2021 FINDINGS: Endotracheal tube tip overlies the trachea proximally 4.1 cm above the carina. Feeding tube passes below the diaphragm, tip excluded by collimation. Previous nasogastric tube is been removed. Unchanged cardiomediastinal silhouette. Increased right pleural effusion layering to the apex. There are similar interstitial and airspace opacities bilaterally. Unchanged small left pleural effusion. No evidence of pneumothorax. Bones are unchanged. IMPRESSION: Increased right pleural effusion layering to the apex. Unchanged small left pleural effusion. Similar interstitial and airspace disease bilaterally. Endotracheal tube overlies the midthoracic trachea approximately 4.1 cm above the carina. Electronically Signed   By: Maurine Simmering M.D.   On: 12/14/2021 16:12   DG Abd 1 View  Result Date: 12/13/2021 CLINICAL DATA:  Fluoroscopic assistance for placement of feeding tube EXAM: ABDOMEN - 1 VIEW COMPARISON:  Abdomen radiograph done on 12/11/2021 FINDINGS: Fluoroscopic image shows tip of feeding tube in region of proximal duodenum. Fluoroscopic time 2 minutes and 12 seconds. Radiation dose 22 mGy. IMPRESSION: Fluoroscopic assistance was provided for placement  of feeding tube. Electronically Signed   By: Elmer Picker M.D.   On: 12/13/2021 12:58    Assessment/Plan Active Problems:   COPD, severe (HCC)   Acute on chronic respiratory failure with hypoxia (HCC)   Oral phase dysphagia   Healthcare-associated pneumonia   Chronic heart failure  with preserved ejection fraction (HCC)  Acute on chronic respiratory failure hypoxia plan is going to be to continue with the weaning process tomorrow,we will try pressure support trial today and see if patient is able to be extubated.  Patient remains orally intubated Severe COPD medical management we will continue to monitor and follow along closely. Oral dysphagia no change we will continue with supportive care Healthcare associated pneumonia has been treated Chronic heart failure preserved ejection fraction patient is at baseline   I have personally seen and evaluated the patient, evaluated laboratory and imaging results, formulated the assessment and plan and placed orders. The Patient requires high complexity decision making with multiple systems involvement.  Rounds were done with the Respiratory Therapy Director and Staff therapists and discussed with nursing staff also.  Allyne Gee, MD [facility] Pulmonary Critical Care Medicine Sleep Medicine

## 2021-12-15 NOTE — Progress Notes (Addendum)
Pulmonary Critical Care Medicine [place]   PULMONARY CRITICAL CARE SERVICE  PROGRESS NOTE     Kenneth Fischer  NLG:[id number]  DOB: [birth year]   DOA: 12/06/2021  Referring Physician: Satira Sark, MD  HPI: Kenneth Fischer is a 71 y.o. male being followed for ventilator/airway/oxygen weaning Acute on Chronic Respiratory Failure.  Patient seen lying in bed, currently remains intubated and on full support.  Versed drip running.  ET tube 26 at the teeth.  Attempted SBT trials 2 times yesterday and unfortunately patient was apneic this may be attributed to Versed drip.  Consider goal of care conference in the next couple of days.  Patient is at timeline for either extubation and comfort care versus tracheostomy.  Medications: Reviewed on Rounds  Physical Exam:  Vitals: Temp 96.0, pulse 52, respirations 16, BP 160/80, SPO2 100%  Ventilator Settings AC VC, FiO2 28%, tidal volume 500, rate 16, PEEP 5  General: Comfortable at this time Neck: supple Cardiovascular: no malignant arrhythmias Respiratory: Bilaterally- Skin: no rash seen on limited exam Musculoskeletal: No gross abnormality Psychiatric:unable to assess Neurologic:no involuntary movements         Lab Data:   Basic Metabolic Panel: Recent Labs  Lab 12/10/21 0127 12/11/21 0253 12/12/21 0126 12/14/21 0218  NA 139 139 140 141  K 3.8 3.9 3.8 3.6  CL 100 101 102 104  CO2 34* '31 31 30  '$ GLUCOSE 142* 124* 144* 110*  BUN 28* 38* 41* 29*  CREATININE 0.93 0.96 0.96 0.69  CALCIUM 8.6* 8.3* 8.3* 8.3*  MG 2.3  --  2.3  --     ABG: Recent Labs  Lab 12/10/21 0640  PHART 7.44  PCO2ART 55*  PO2ART 92  HCO3 37.2*  O2SAT 99.4    Liver Function Tests: No results for input(s): "AST", "ALT", "ALKPHOS", "BILITOT", "PROT", "ALBUMIN" in the last 168 hours. No results for input(s): "LIPASE", "AMYLASE" in the last 168 hours. No results for input(s): "AMMONIA" in the last 168 hours.  CBC: Recent Labs   Lab 12/10/21 0127 12/12/21 0126  WBC 6.5 6.6  HGB 12.5* 11.0*  HCT 40.3 35.7*  MCV 88.6 88.4  PLT 129* 126*    Cardiac Enzymes: No results for input(s): "CKTOTAL", "CKMB", "CKMBINDEX", "TROPONINI" in the last 168 hours.  BNP (last 3 results) No results for input(s): "BNP" in the last 8760 hours.  ProBNP (last 3 results) No results for input(s): "PROBNP" in the last 8760 hours.  Radiological Exams: DG CHEST PORT 1 VIEW  Result Date: 12/14/2021 CLINICAL DATA:  Pleural effusion EXAM: PORTABLE CHEST 1 VIEW COMPARISON:  Radiograph 12/11/2021 FINDINGS: Endotracheal tube tip overlies the trachea proximally 4.1 cm above the carina. Feeding tube passes below the diaphragm, tip excluded by collimation. Previous nasogastric tube is been removed. Unchanged cardiomediastinal silhouette. Increased right pleural effusion layering to the apex. There are similar interstitial and airspace opacities bilaterally. Unchanged small left pleural effusion. No evidence of pneumothorax. Bones are unchanged. IMPRESSION: Increased right pleural effusion layering to the apex. Unchanged small left pleural effusion. Similar interstitial and airspace disease bilaterally. Endotracheal tube overlies the midthoracic trachea approximately 4.1 cm above the carina. Electronically Signed   By: Maurine Simmering M.D.   On: 12/14/2021 16:12   DG Abd 1 View  Result Date: 12/13/2021 CLINICAL DATA:  Fluoroscopic assistance for placement of feeding tube EXAM: ABDOMEN - 1 VIEW COMPARISON:  Abdomen radiograph done on 12/11/2021 FINDINGS: Fluoroscopic image shows tip of feeding tube in region of proximal duodenum. Fluoroscopic time  2 minutes and 12 seconds. Radiation dose 22 mGy. IMPRESSION: Fluoroscopic assistance was provided for placement of feeding tube. Electronically Signed   By: Elmer Picker M.D.   On: 12/13/2021 12:58    Assessment/Plan Active Problems:   COPD, severe (HCC)   Acute on chronic respiratory failure with  hypoxia (HCC)   Oral phase dysphagia   Healthcare-associated pneumonia   Chronic heart failure with preserved ejection fraction (HCC)   Acute on chronic respiratory failure with hypoxia-continues to remain on full support ventilation.  Continue with ventilator bundle supportive care. Severe COPD-continue with medical management. Oral dysphagia-remains at high risk for aspiration. Chronic heart failure with preserved ejection fraction-appears to be compensated.  Continue monitoring fluid balance.   I have personally seen and evaluated the patient, evaluated laboratory and imaging results, formulated the assessment and plan and placed orders. The Patient requires high complexity decision making with multiple systems involvement.  Rounds were done with the Respiratory Therapy Director and Staff therapists and discussed with nursing staff also.  Allyne Gee, MD [facility] Pulmonary Critical Care Medicine Sleep Medicine

## 2021-12-16 ENCOUNTER — Other Ambulatory Visit (HOSPITAL_COMMUNITY): Payer: Medicare Other

## 2021-12-16 DIAGNOSIS — J449 Chronic obstructive pulmonary disease, unspecified: Secondary | ICD-10-CM | POA: Diagnosis not present

## 2021-12-16 DIAGNOSIS — I5032 Chronic diastolic (congestive) heart failure: Secondary | ICD-10-CM | POA: Diagnosis not present

## 2021-12-16 DIAGNOSIS — J9621 Acute and chronic respiratory failure with hypoxia: Secondary | ICD-10-CM | POA: Diagnosis not present

## 2021-12-16 DIAGNOSIS — J189 Pneumonia, unspecified organism: Secondary | ICD-10-CM | POA: Diagnosis not present

## 2021-12-17 DIAGNOSIS — J9621 Acute and chronic respiratory failure with hypoxia: Secondary | ICD-10-CM | POA: Diagnosis not present

## 2021-12-17 DIAGNOSIS — J449 Chronic obstructive pulmonary disease, unspecified: Secondary | ICD-10-CM | POA: Diagnosis not present

## 2021-12-17 DIAGNOSIS — J189 Pneumonia, unspecified organism: Secondary | ICD-10-CM | POA: Diagnosis not present

## 2021-12-17 DIAGNOSIS — I5032 Chronic diastolic (congestive) heart failure: Secondary | ICD-10-CM | POA: Diagnosis not present

## 2021-12-17 LAB — BASIC METABOLIC PANEL
Anion gap: 5 (ref 5–15)
BUN: 59 mg/dL — ABNORMAL HIGH (ref 8–23)
CO2: 31 mmol/L (ref 22–32)
Calcium: 7.9 mg/dL — ABNORMAL LOW (ref 8.9–10.3)
Chloride: 104 mmol/L (ref 98–111)
Creatinine, Ser: 0.93 mg/dL (ref 0.61–1.24)
GFR, Estimated: >60 mL/min (ref >60)
Glucose, Bld: 105 mg/dL — ABNORMAL HIGH (ref 70–99)
Potassium: 3.8 mmol/L (ref 3.5–5.1)
Sodium: 140 mmol/L (ref 135–145)

## 2021-12-17 LAB — CBC
HCT: 37.2 % — ABNORMAL LOW (ref 39.0–52.0)
Hemoglobin: 11.7 g/dL — ABNORMAL LOW (ref 13.0–17.0)
MCH: 27.8 pg (ref 26.0–34.0)
MCHC: 31.5 g/dL (ref 30.0–36.0)
MCV: 88.4 fL (ref 80.0–100.0)
Platelets: 132 10*3/uL — ABNORMAL LOW (ref 150–400)
RBC: 4.21 MIL/uL — ABNORMAL LOW (ref 4.22–5.81)
RDW: 18.1 % — ABNORMAL HIGH (ref 11.5–15.5)
WBC: 6.4 10*3/uL (ref 4.0–10.5)
nRBC: 0 % (ref 0.0–0.2)

## 2021-12-17 LAB — MAGNESIUM: Magnesium: 2.1 mg/dL (ref 1.7–2.4)

## 2021-12-17 NOTE — Progress Notes (Cosign Needed)
Pulmonary Critical Care Medicine [place]   PULMONARY CRITICAL CARE SERVICE  PROGRESS NOTE     Kenneth Fischer  OIN:[id number]  DOB: 1950/06/30   DOA: 12/06/2021  Referring Physician: Satira Sark, MD  HPI: Kenneth Fischer is a 71 y.o. male being followed for ventilator/airway/oxygen weaning Acute on Chronic Respiratory Failure.  Patient seen lying in bed, currently on full support ventilation on low-dose percent.  Continues to unfortunately fail spontaneous breathing trials.  Pulmonology continues to recommend trach placement for further attempts at weaning or comfort care discussion with family.  Medications: Reviewed on Rounds  Physical Exam:  Vitals: Temp 96.9, pulse 72, respirations 15, BP 102/69, SPO2 100%   Ventilator Settings AC VC, FiO2 30%, tidal volume 500, rate 16, PEEP 5   General: Comfortable at this time Neck: supple Cardiovascular: no malignant arrhythmias Respiratory: Bilaterally diminished Skin: no rash seen on limited exam Musculoskeletal: No gross abnormality Psychiatric:unable to assess Neurologic:no involuntary movements         Lab Data:   Basic Metabolic Panel: Recent Labs  Lab 12/11/21 0253 12/12/21 0126 12/14/21 0218 12/17/21 0129  NA 139 140 141 140  K 3.9 3.8 3.6 3.8  CL 101 102 104 104  CO2 '31 31 30 31  '$ GLUCOSE 124* 144* 110* 105*  BUN 38* 41* 29* 59*  CREATININE 0.96 0.96 0.69 0.93  CALCIUM 8.3* 8.3* 8.3* 7.9*  MG  --  2.3  --  2.1    ABG: No results for input(s): "PHART", "PCO2ART", "PO2ART", "HCO3", "O2SAT" in the last 168 hours.  Liver Function Tests: No results for input(s): "AST", "ALT", "ALKPHOS", "BILITOT", "PROT", "ALBUMIN" in the last 168 hours. No results for input(s): "LIPASE", "AMYLASE" in the last 168 hours. No results for input(s): "AMMONIA" in the last 168 hours.  CBC: Recent Labs  Lab 12/12/21 0126 12/17/21 0129  WBC 6.6 6.4  HGB 11.0* 11.7*  HCT 35.7* 37.2*  MCV 88.4 88.4  PLT  126* 132*    Cardiac Enzymes: No results for input(s): "CKTOTAL", "CKMB", "CKMBINDEX", "TROPONINI" in the last 168 hours.  BNP (last 3 results) No results for input(s): "BNP" in the last 8760 hours.  ProBNP (last 3 results) No results for input(s): "PROBNP" in the last 8760 hours.  Radiological Exams: DG Chest 1 View  Result Date: 12/16/2021 CLINICAL DATA:  Respiratory failure with pleural effusion EXAM: CHEST  1 VIEW COMPARISON:  Two days ago FINDINGS: Endotracheal tube with tip just below the clavicular heads. A feeding tube at least reaches the stomach. Improved right upper lobe aeration. Diffuse reticular opacity with volume loss and calcification. Small right pleural effusion. IMPRESSION: 1. Unremarkable hardware. 2. Improved right upper lobe aeration. 3. Extensive chronic lung disease. Electronically Signed   By: Jorje Guild M.D.   On: 12/16/2021 06:22    Assessment/Plan Active Problems:   COPD, severe (HCC)   Acute on chronic respiratory failure with hypoxia (HCC)   Oral phase dysphagia   Healthcare-associated pneumonia   Chronic heart failure with preserved ejection fraction (HCC)  Acute on chronic respiratory failure with hypoxia-continues to remain on full support ventilation.  Continue with ventilator bundle supportive care. Severe COPD-continue with medical management. Oral dysphagia-remains at high risk for aspiration. Chronic heart failure with preserved ejection fraction-appears to be compensated.  Continue monitoring fluid balance.   I have personally seen and evaluated the patient, evaluated laboratory and imaging results, formulated the assessment and plan and placed orders. The Patient requires high complexity decision making with multiple  systems involvement.  Rounds were done with the Respiratory Therapy Director and Staff therapists and discussed with nursing staff also.  Allyne Gee, MD [facility] Pulmonary Critical Care Medicine Sleep Medicine

## 2021-12-17 NOTE — Progress Notes (Cosign Needed)
Pulmonary Critical Care Medicine [place]   PULMONARY CRITICAL CARE SERVICE  PROGRESS NOTE     Kenneth Fischer  XBJ:[id number]  DOB: 1950/08/11   DOA: 12/06/2021  Referring Physician: Satira Sark, MD  HPI: Kenneth Fischer is a 71 y.o. male being followed for ventilator/airway/oxygen weaning Acute on Chronic Respiratory Failure.  Patient seen lying in bed, currently on full support ventilation, remains on Versed.  Unfortunately did fail SBT again today.  Pulmonology recommends we will forward with tracheostomy for further weaning or comfort care discussion.  Medications: Reviewed on Rounds  Physical Exam:  Vitals: Temp 98.2, pulse 71, respirations 15, BP 108/63, SPO2 98%  Ventilator Settings AC VC, FiO2 30%, tidal volume 500, rate 16, PEEP 5  General: Comfortable at this time Neck: supple Cardiovascular: no malignant arrhythmias Respiratory: Bilaterally diminished Skin: no rash seen on limited exam Musculoskeletal: No gross abnormality Psychiatric:unable to assess Neurologic:no involuntary movements         Lab Data:   Basic Metabolic Panel: Recent Labs  Lab 12/11/21 0253 12/12/21 0126 12/14/21 0218 12/17/21 0129  NA 139 140 141 140  K 3.9 3.8 3.6 3.8  CL 101 102 104 104  CO2 '31 31 30 31  '$ GLUCOSE 124* 144* 110* 105*  BUN 38* 41* 29* 59*  CREATININE 0.96 0.96 0.69 0.93  CALCIUM 8.3* 8.3* 8.3* 7.9*  MG  --  2.3  --  2.1    ABG: No results for input(s): "PHART", "PCO2ART", "PO2ART", "HCO3", "O2SAT" in the last 168 hours.  Liver Function Tests: No results for input(s): "AST", "ALT", "ALKPHOS", "BILITOT", "PROT", "ALBUMIN" in the last 168 hours. No results for input(s): "LIPASE", "AMYLASE" in the last 168 hours. No results for input(s): "AMMONIA" in the last 168 hours.  CBC: Recent Labs  Lab 12/12/21 0126 12/17/21 0129  WBC 6.6 6.4  HGB 11.0* 11.7*  HCT 35.7* 37.2*  MCV 88.4 88.4  PLT 126* 132*    Cardiac Enzymes: No results  for input(s): "CKTOTAL", "CKMB", "CKMBINDEX", "TROPONINI" in the last 168 hours.  BNP (last 3 results) No results for input(s): "BNP" in the last 8760 hours.  ProBNP (last 3 results) No results for input(s): "PROBNP" in the last 8760 hours.  Radiological Exams: DG Chest 1 View  Result Date: 12/16/2021 CLINICAL DATA:  Respiratory failure with pleural effusion EXAM: CHEST  1 VIEW COMPARISON:  Two days ago FINDINGS: Endotracheal tube with tip just below the clavicular heads. A feeding tube at least reaches the stomach. Improved right upper lobe aeration. Diffuse reticular opacity with volume loss and calcification. Small right pleural effusion. IMPRESSION: 1. Unremarkable hardware. 2. Improved right upper lobe aeration. 3. Extensive chronic lung disease. Electronically Signed   By: Jorje Guild M.D.   On: 12/16/2021 06:22    Assessment/Plan Active Problems:   COPD, severe (HCC)   Acute on chronic respiratory failure with hypoxia (HCC)   Oral phase dysphagia   Healthcare-associated pneumonia   Chronic heart failure with preserved ejection fraction (HCC)  Acute on chronic respiratory failure with hypoxia-continues to remain on full support ventilation.  Continue with ventilator bundle supportive care. Severe COPD-continue with medical management. Oral dysphagia-remains at high risk for aspiration. Chronic heart failure with preserved ejection fraction-appears to be compensated.  Continue monitoring fluid balance.    I have personally seen and evaluated the patient, evaluated laboratory and imaging results, formulated the assessment and plan and placed orders. The Patient requires high complexity decision making with multiple systems involvement.  Rounds were  done with the Respiratory Therapy Director and Staff therapists and discussed with nursing staff also.  Allyne Gee, MD [facility] Pulmonary Critical Care Medicine Sleep Medicine

## 2021-12-18 DIAGNOSIS — J9621 Acute and chronic respiratory failure with hypoxia: Secondary | ICD-10-CM | POA: Diagnosis not present

## 2021-12-18 DIAGNOSIS — J189 Pneumonia, unspecified organism: Secondary | ICD-10-CM | POA: Diagnosis not present

## 2021-12-18 DIAGNOSIS — I5032 Chronic diastolic (congestive) heart failure: Secondary | ICD-10-CM | POA: Diagnosis not present

## 2021-12-18 DIAGNOSIS — J449 Chronic obstructive pulmonary disease, unspecified: Secondary | ICD-10-CM | POA: Diagnosis not present

## 2021-12-19 ENCOUNTER — Other Ambulatory Visit (HOSPITAL_COMMUNITY): Payer: Medicare Other

## 2021-12-19 DIAGNOSIS — J9621 Acute and chronic respiratory failure with hypoxia: Secondary | ICD-10-CM | POA: Diagnosis not present

## 2021-12-19 DIAGNOSIS — J449 Chronic obstructive pulmonary disease, unspecified: Secondary | ICD-10-CM | POA: Diagnosis not present

## 2021-12-19 DIAGNOSIS — I5032 Chronic diastolic (congestive) heart failure: Secondary | ICD-10-CM | POA: Diagnosis not present

## 2021-12-19 DIAGNOSIS — J189 Pneumonia, unspecified organism: Secondary | ICD-10-CM | POA: Diagnosis not present

## 2021-12-19 LAB — CBC
HCT: 41.2 % (ref 39.0–52.0)
Hemoglobin: 13 g/dL (ref 13.0–17.0)
MCH: 27.8 pg (ref 26.0–34.0)
MCHC: 31.6 g/dL (ref 30.0–36.0)
MCV: 88 fL (ref 80.0–100.0)
Platelets: 122 10*3/uL — ABNORMAL LOW (ref 150–400)
RBC: 4.68 MIL/uL (ref 4.22–5.81)
RDW: 17.8 % — ABNORMAL HIGH (ref 11.5–15.5)
WBC: 6.1 10*3/uL (ref 4.0–10.5)
nRBC: 0 % (ref 0.0–0.2)

## 2021-12-19 LAB — BASIC METABOLIC PANEL
Anion gap: 8 (ref 5–15)
BUN: 60 mg/dL — ABNORMAL HIGH (ref 8–23)
CO2: 32 mmol/L (ref 22–32)
Calcium: 8.3 mg/dL — ABNORMAL LOW (ref 8.9–10.3)
Chloride: 104 mmol/L (ref 98–111)
Creatinine, Ser: 0.74 mg/dL (ref 0.61–1.24)
GFR, Estimated: >60 mL/min (ref >60)
Glucose, Bld: 171 mg/dL — ABNORMAL HIGH (ref 70–99)
Potassium: 4.1 mmol/L (ref 3.5–5.1)
Sodium: 144 mmol/L (ref 135–145)

## 2021-12-19 LAB — MAGNESIUM: Magnesium: 2.5 mg/dL — ABNORMAL HIGH (ref 1.7–2.4)

## 2021-12-19 NOTE — Progress Notes (Cosign Needed)
Pulmonary Critical Care Medicine [place]   PULMONARY CRITICAL CARE SERVICE  PROGRESS NOTE     Jentry Warnell  [id number]  DOB: [birth year]   DOA: 12/06/2021  Referring Physician: Satira Sark, MD  HPI: Kenneth Fischer is a 71 y.o. male being followed for ventilator/airway/oxygen weaning Acute on Chronic Respiratory Failure.  Patient seen lying in bed, currently remains on full support ventilation.  Remains on low-dose percent.  Is awake and follows commands.  Will reattempt spontaneous breathing trials.  Recommendations remain for trach placement.  Medications: Reviewed on Rounds  Physical Exam:  Vitals: Temp 97.9, pulse 67, respirations 15, BP 107/61, SPO2 100%  Ventilator Settings AC VC, FiO2 30%, tidal volume 500, rate 16, PEEP 5   General: Comfortable at this time Neck: supple Cardiovascular: no malignant arrhythmias Respiratory: Bilaterally coarse Skin: no rash seen on limited exam Musculoskeletal: No gross abnormality Psychiatric:unable to assess Neurologic:no involuntary movements         Lab Data:   Basic Metabolic Panel: Recent Labs  Lab 12/14/21 0218 12/17/21 0129 12/19/21 0120  NA 141 140 144  K 3.6 3.8 4.1  CL 104 104 104  CO2 30 31 32  GLUCOSE 110* 105* 171*  BUN 29* 59* 60*  CREATININE 0.69 0.93 0.74  CALCIUM 8.3* 7.9* 8.3*  MG  --  2.1 2.5*    ABG: No results for input(s): "PHART", "PCO2ART", "PO2ART", "HCO3", "O2SAT" in the last 168 hours.  Liver Function Tests: No results for input(s): "AST", "ALT", "ALKPHOS", "BILITOT", "PROT", "ALBUMIN" in the last 168 hours. No results for input(s): "LIPASE", "AMYLASE" in the last 168 hours. No results for input(s): "AMMONIA" in the last 168 hours.  CBC: Recent Labs  Lab 12/17/21 0129 12/19/21 0120  WBC 6.4 6.1  HGB 11.7* 13.0  HCT 37.2* 41.2  MCV 88.4 88.0  PLT 132* 122*    Cardiac Enzymes: No results for input(s): "CKTOTAL", "CKMB", "CKMBINDEX", "TROPONINI" in  the last 168 hours.  BNP (last 3 results) No results for input(s): "BNP" in the last 8760 hours.  ProBNP (last 3 results) No results for input(s): "PROBNP" in the last 8760 hours.  Radiological Exams: DG CHEST PORT 1 VIEW  Result Date: 12/19/2021 CLINICAL DATA:  CHF EXAM: PORTABLE CHEST 1 VIEW COMPARISON:  Three days ago FINDINGS: Endotracheal tube with tip just below the clavicular heads. The feeding tube reaches the stomach. Chronic lung disease with postoperative changes at the left base. Hyperinflation and trace pleural effusions. No pneumothorax. Normal heart size. IMPRESSION: Stable hardware positioning and aeration Electronically Signed   By: Jorje Guild M.D.   On: 12/19/2021 06:12    Assessment/Plan Active Problems:   COPD, severe (HCC)   Acute on chronic respiratory failure with hypoxia (HCC)   Oral phase dysphagia   Healthcare-associated pneumonia   Chronic heart failure with preserved ejection fraction (HCC)   Acute on chronic respiratory failure with hypoxia-continues to remain on full support ventilation.  Continue with ventilator bundle supportive care. Severe COPD-continue with medical management. Oral dysphagia-remains at high risk for aspiration. Chronic heart failure with preserved ejection fraction-appears to be compensated.  Continue monitoring fluid balance.   I have personally seen and evaluated the patient, evaluated laboratory and imaging results, formulated the assessment and plan and placed orders. The Patient requires high complexity decision making with multiple systems involvement.  Rounds were done with the Respiratory Therapy Director and Staff therapists and discussed with nursing staff also.  Allyne Gee, MD [facility] Pulmonary Critical Care  Medicine Sleep Medicine

## 2021-12-19 NOTE — Progress Notes (Cosign Needed)
Pulmonary Critical Care Medicine [place]   PULMONARY CRITICAL CARE SERVICE  PROGRESS NOTE     Kenneth Fischer  [id number]  DOB: [birth year]   DOA: 12/06/2021  Referring Physician: Satira Sark, MD  HPI: Kenneth Fischer is a 71 y.o. male being followed for ventilator/airway/oxygen weaning Acute on Chronic Respiratory Failure.  Patient seen lying in bed, currently intubated and remains on full support.  Versed drip is on.  Patient is awake this morning.  I have asked respiratory therapy to do spontaneous breathing trials on patient today.  Recommendations are still for trach placement as it is unlikely patient will be weaned off ventilator anytime soon.  Medications: Reviewed on Rounds  Physical Exam:  Vitals: Temp 96.7, pulse 65, respirations 16, BP 112/64, SPO2 98%  Ventilator Settings AC VC, FiO2 30%, tidal volume 500, rate 16, PEEP 5  General: Comfortable at this time Neck: supple Cardiovascular: no malignant arrhythmias Respiratory: Bilaterally coarse Skin: no rash seen on limited exam Musculoskeletal: No gross abnormality Psychiatric:unable to assess Neurologic:no involuntary movements         Lab Data:   Basic Metabolic Panel: Recent Labs  Lab 12/14/21 0218 12/17/21 0129 12/19/21 0120  NA 141 140 144  K 3.6 3.8 4.1  CL 104 104 104  CO2 30 31 32  GLUCOSE 110* 105* 171*  BUN 29* 59* 60*  CREATININE 0.69 0.93 0.74  CALCIUM 8.3* 7.9* 8.3*  MG  --  2.1 2.5*    ABG: No results for input(s): "PHART", "PCO2ART", "PO2ART", "HCO3", "O2SAT" in the last 168 hours.  Liver Function Tests: No results for input(s): "AST", "ALT", "ALKPHOS", "BILITOT", "PROT", "ALBUMIN" in the last 168 hours. No results for input(s): "LIPASE", "AMYLASE" in the last 168 hours. No results for input(s): "AMMONIA" in the last 168 hours.  CBC: Recent Labs  Lab 12/17/21 0129 12/19/21 0120  WBC 6.4 6.1  HGB 11.7* 13.0  HCT 37.2* 41.2  MCV 88.4 88.0  PLT 132*  122*    Cardiac Enzymes: No results for input(s): "CKTOTAL", "CKMB", "CKMBINDEX", "TROPONINI" in the last 168 hours.  BNP (last 3 results) No results for input(s): "BNP" in the last 8760 hours.  ProBNP (last 3 results) No results for input(s): "PROBNP" in the last 8760 hours.  Radiological Exams: DG CHEST PORT 1 VIEW  Result Date: 12/19/2021 CLINICAL DATA:  CHF EXAM: PORTABLE CHEST 1 VIEW COMPARISON:  Three days ago FINDINGS: Endotracheal tube with tip just below the clavicular heads. The feeding tube reaches the stomach. Chronic lung disease with postoperative changes at the left base. Hyperinflation and trace pleural effusions. No pneumothorax. Normal heart size. IMPRESSION: Stable hardware positioning and aeration Electronically Signed   By: Jorje Guild M.D.   On: 12/19/2021 06:12    Assessment/Plan Active Problems:   COPD, severe (HCC)   Acute on chronic respiratory failure with hypoxia (HCC)   Oral phase dysphagia   Healthcare-associated pneumonia   Chronic heart failure with preserved ejection fraction (HCC)   Acute on chronic respiratory failure with hypoxia-continues to remain on full support ventilation.  Continue with ventilator bundle supportive care. Severe COPD-continue with medical management. Oral dysphagia-remains at high risk for aspiration. Chronic heart failure with preserved ejection fraction-appears to be compensated.  Continue monitoring fluid balance.   I have personally seen and evaluated the patient, evaluated laboratory and imaging results, formulated the assessment and plan and placed orders. The Patient requires high complexity decision making with multiple systems involvement.  Rounds were done with the  Respiratory Therapy Director and Staff therapists and discussed with nursing staff also.  Allyne Gee, MD [facility] Pulmonary Critical Care Medicine Sleep Medicine

## 2021-12-20 ENCOUNTER — Other Ambulatory Visit (HOSPITAL_COMMUNITY): Payer: Medicare Other

## 2021-12-20 ENCOUNTER — Inpatient Hospital Stay
Admission: RE | Admit: 2021-12-20 | Discharge: 2022-02-27 | Payer: Medicare Other | Source: Other Acute Inpatient Hospital

## 2021-12-20 ENCOUNTER — Observation Stay (HOSPITAL_COMMUNITY)
Admission: AD | Admit: 2021-12-20 | Discharge: 2021-12-20 | Disposition: A | Discharge status: Other Acute Inpatient Hospital | Payer: No Typology Code available for payment source | Source: Other Acute Inpatient Hospital | Attending: Internal Medicine | Admitting: Internal Medicine

## 2021-12-20 ENCOUNTER — Encounter (HOSPITAL_COMMUNITY): Payer: Self-pay

## 2021-12-20 DIAGNOSIS — I5032 Chronic diastolic (congestive) heart failure: Secondary | ICD-10-CM | POA: Diagnosis present

## 2021-12-20 DIAGNOSIS — N1831 Chronic kidney disease, stage 3a: Secondary | ICD-10-CM | POA: Diagnosis not present

## 2021-12-20 DIAGNOSIS — J449 Chronic obstructive pulmonary disease, unspecified: Secondary | ICD-10-CM | POA: Diagnosis not present

## 2021-12-20 DIAGNOSIS — J9621 Acute and chronic respiratory failure with hypoxia: Secondary | ICD-10-CM | POA: Diagnosis present

## 2021-12-20 DIAGNOSIS — Z87891 Personal history of nicotine dependence: Secondary | ICD-10-CM | POA: Diagnosis not present

## 2021-12-20 DIAGNOSIS — N183 Chronic kidney disease, stage 3 unspecified: Secondary | ICD-10-CM | POA: Diagnosis present

## 2021-12-20 DIAGNOSIS — Z7901 Long term (current) use of anticoagulants: Secondary | ICD-10-CM | POA: Insufficient documentation

## 2021-12-20 DIAGNOSIS — J9611 Chronic respiratory failure with hypoxia: Secondary | ICD-10-CM | POA: Diagnosis not present

## 2021-12-20 DIAGNOSIS — J9620 Acute and chronic respiratory failure, unspecified whether with hypoxia or hypercapnia: Principal | ICD-10-CM | POA: Diagnosis present

## 2021-12-20 DIAGNOSIS — Z87718 Personal history of other specified (corrected) congenital malformations of genitourinary system: Secondary | ICD-10-CM | POA: Insufficient documentation

## 2021-12-20 DIAGNOSIS — J189 Pneumonia, unspecified organism: Secondary | ICD-10-CM | POA: Diagnosis present

## 2021-12-20 DIAGNOSIS — J9612 Chronic respiratory failure with hypercapnia: Secondary | ICD-10-CM

## 2021-12-20 DIAGNOSIS — J9622 Acute and chronic respiratory failure with hypercapnia: Secondary | ICD-10-CM | POA: Diagnosis not present

## 2021-12-20 DIAGNOSIS — Z79899 Other long term (current) drug therapy: Secondary | ICD-10-CM | POA: Insufficient documentation

## 2021-12-20 LAB — BASIC METABOLIC PANEL
Anion gap: 9 (ref 5–15)
BUN: 62 mg/dL — ABNORMAL HIGH (ref 8–23)
CO2: 33 mmol/L — ABNORMAL HIGH (ref 22–32)
Calcium: 8.3 mg/dL — ABNORMAL LOW (ref 8.9–10.3)
Chloride: 105 mmol/L (ref 98–111)
Creatinine, Ser: 0.77 mg/dL (ref 0.61–1.24)
GFR, Estimated: >60 mL/min (ref >60)
Glucose, Bld: 163 mg/dL — ABNORMAL HIGH (ref 70–99)
Potassium: 4.3 mmol/L (ref 3.5–5.1)
Sodium: 147 mmol/L — ABNORMAL HIGH (ref 135–145)

## 2021-12-20 MED ORDER — ROCURONIUM BROMIDE 50 MG/5ML IV SOLN
100.0000 mg | Freq: Once | INTRAVENOUS | Status: AC
  Administered 2021-12-20: 100 mg via INTRAVENOUS
  Filled 2021-12-20: qty 10

## 2021-12-20 MED ORDER — ETOMIDATE 2 MG/ML IV SOLN
20.0000 mg | Freq: Once | INTRAVENOUS | Status: AC
  Administered 2021-12-20: 20 mg via INTRAVENOUS

## 2021-12-20 MED ORDER — LIDOCAINE-EPINEPHRINE (PF) 2 %-1:200000 IJ SOLN
INTRAMUSCULAR | Status: AC
  Filled 2021-12-20: qty 20

## 2021-12-20 MED ORDER — MIDAZOLAM HCL 2 MG/2ML IJ SOLN
INTRAMUSCULAR | Status: AC
  Filled 2021-12-20: qty 2

## 2021-12-20 MED ORDER — FENTANYL CITRATE PF 50 MCG/ML IJ SOSY
200.0000 ug | PREFILLED_SYRINGE | Freq: Once | INTRAMUSCULAR | Status: AC
  Administered 2021-12-20: 100 ug via INTRAVENOUS

## 2021-12-20 MED ORDER — MIDAZOLAM HCL 2 MG/2ML IJ SOLN
5.0000 mg | Freq: Once | INTRAMUSCULAR | Status: AC
  Administered 2021-12-20: 4 mg via INTRAVENOUS

## 2021-12-20 NOTE — Progress Notes (Signed)
Patient arrived to unit from [facility], accompanied by RN and RT. CCM at bedside to perform tracheostomy. Vitals signs stable prior to procedure. Procedure performed by Ina Homes, MD & Hillery Aldo, NP. See procedural notes for details. Vitals signs stable following procedure. Patient transferred back to [facility], accompanied by RN and RT.

## 2021-12-20 NOTE — Discharge Summary (Signed)
Admit: 12/20/21 Discharge: 12/20/21 Diagnosis: persistent respiratory failure.  Patient admitted to [facility] ICU for percutaneous tracheostomy placement.  He underwent this without issue.  He will return to [facility] for ongoing care.  Erskine Emery MD PCCM

## 2021-12-20 NOTE — Procedures (Signed)
Diagnostic Bronchoscopy  Kenneth Fischer  [id number]  05/11/1950  Date:12/20/21  Time:3:53 PM   Provider Performing:Kevontay Burks Cleon Dew with direct proctoring and supervision of Noe Gens NP  Procedure: Diagnostic Bronchoscopy [id number])  Indication(s) Assist with direct visualization of tracheostomy placement  Consent Risks of the procedure as well as the alternatives and risks of each were explained to the patient and/or caregiver.  Consent for the procedure was obtained.   Anesthesia See separate tracheostomy note   Time Out Verified patient identification, verified procedure, site/side was marked, verified correct patient position, special equipment/implants available, medications/allergies/relevant history reviewed, required imaging and test results available.   Sterile Technique Usual hand hygiene, masks, gowns, and gloves were used   Procedure Description Bronchoscope advanced through endotracheal tube and into airway.  After suctioning out tracheal secretions, bronchoscope used to provide direct visualization of tracheostomy placement.   Complications/Tolerance None; patient tolerated the procedure well.   EBL None  Specimen(s) None   Redmond School., MSN, APRN, AGACNP-BC [place] [facility]  12/20/2021 , 3:54 PM  Please see [email or web address] for pager details  If no response, please call [phone] After hours, please call Elink at [phone]

## 2021-12-20 NOTE — Procedures (Signed)
Percutaneous Tracheostomy Procedure Note   Kenneth Fischer  [id number]  [birth year]  Date:12/20/21  Time:3:58 PM   Provider Performing:Elisea Khader C Tamala Julian  Procedure: Percutaneous Tracheostomy with Bronchoscopic Guidance ([id number])  Indication(s) Persistent resp failure  Consent Risks of the procedure as well as the alternatives and risks of each were explained to the patient and/or caregiver.  Consent for the procedure was obtained.  Anesthesia Etomidate, Versed, Fentanyl, Rocuronium   Time Out Verified patient identification, verified procedure, site/side was marked, verified correct patient position, special equipment/implants available, medications/allergies/relevant history reviewed, required imaging and test results available.   Sterile Technique Maximal sterile technique including sterile barrier drape, hand hygiene, sterile gown, sterile gloves, mask, hair covering.    Procedure Description Appropriate anatomy identified by palpation.  Patient's neck prepped and draped in sterile fashion.  1% lidocaine with epinephrine was used to anesthetize skin overlying neck.  1.5cm incision made and blunt dissection performed until tracheal rings could be easily palpated.   Then a size 6-0 Shiley tracheostomy was placed under bronchoscopic visualization using usual Seldinger technique and serial dilation.   Bronchoscope confirmed placement above the carina.  Tracheostomy was sutured in place with adhesive pad to protect skin under pressure.    Patient connected to ventilator.   Complications/Tolerance None; patient tolerated the procedure well. Chest X-ray is ordered to confirm no post-procedural complication.   EBL Minimal   Specimen(s) None

## 2021-12-20 NOTE — H&P (Signed)
NAME:  Kenneth Fischer, MRN:  [id number], DOB:  1951-01-23, LOS: 0 ADMISSION DATE:  12/20/2021, CONSULTATION DATE:  12/20/21 REFERRING MD:  Dr. Owens Shark, [facility] , CHIEF COMPLAINT:  Resp failure   History of Present Illness:  71 year old man with hx of advanced COPD, prior lobectomy for local lung ca presenting with acute on chronic hypoxemic and hypercapneic respiratory failure.  Found to have MRSA pneumonia.  Unfortunately have been unable to wean from vent so a tracheostomy is requested to facilitate.  Pertinent  Medical History  COPD Prior lung ca with lobectomy Muscular deconditioning Prior DVT  Significant Hospital Events: Including procedures, antibiotic start and stop dates in addition to other pertinent events     Interim History / Subjective:  Admit for trach  Objective   Saturating well on 40% FiO2  Examination: General: No distress HENT: ETT in place with thick secretions Lungs: bilateral rhonci, triggers vent Cardiovascular: RRR, ext warm Abdomen: soft, +BS Extremities: trace anasarca Neuro: moves all 4 ext  Labs reviewed  Resolved Hospital Problem list   N/A  Assessment & Plan:  Acute on chronic hypoxemic and hypercarbic respiratory failure in setting of advanced COPD, prior lobectomy and MRSA bronchitis. - Percutaneous tracheostomy - Hold eliquis until assure that no sign bleeding - Discussed with Dr. Owens Shark    Labs   CBC: Recent Labs  Lab 12/17/21 0129 12/19/21 0120  WBC 6.4 6.1  HGB 11.7* 13.0  HCT 37.2* 41.2  MCV 88.4 88.0  PLT 132* 122*    Basic Metabolic Panel: Recent Labs  Lab 12/14/21 0218 12/17/21 0129 12/19/21 0120 12/20/21 0148  NA 141 140 144 147*  K 3.6 3.8 4.1 4.3  CL 104 104 104 105  CO2 30 31 32 33*  GLUCOSE 110* 105* 171* 163*  BUN 29* 59* 60* 62*  CREATININE 0.69 0.93 0.74 0.77  CALCIUM 8.3* 7.9* 8.3* 8.3*  MG  --  2.1 2.5*  --    GFR: CrCl cannot be calculated (Unknown ideal weight.). Recent Labs  Lab  12/17/21 0129 12/19/21 0120  WBC 6.4 6.1    Liver Function Tests: No results for input(s): "AST", "ALT", "ALKPHOS", "BILITOT", "PROT", "ALBUMIN" in the last 168 hours. No results for input(s): "LIPASE", "AMYLASE" in the last 168 hours. No results for input(s): "AMMONIA" in the last 168 hours.  ABG    Component Value Date/Time   PHART 7.44 12/10/2021 0640   PCO2ART 55 (H) 12/10/2021 0640   PO2ART 92 12/10/2021 0640   HCO3 37.2 (H) 12/10/2021 0640   O2SAT 99.4 12/10/2021 0640     Coagulation Profile: No results for input(s): "INR", "PROTIME" in the last 168 hours.  Cardiac Enzymes: No results for input(s): "CKTOTAL", "CKMB", "CKMBINDEX", "TROPONINI" in the last 168 hours.  HbA1C: No results found for: "HGBA1C"  CBG: No results for input(s): "GLUCAP" in the last 168 hours.  Review of Systems:   Intubated on versed  Past Medical History:  He,  has a past medical history of Bronchitis, Cancer ([place]), COPD (chronic obstructive pulmonary disease) ([place]), DVT (deep vein thrombosis) in pregnancy, History of kidney stones, Hypertension, and Pneumonia.   Surgical History:   Past Surgical History:  Procedure Laterality Date   INTERCOSTAL NERVE BLOCK Left 10/15/2020   Procedure: INTERCOSTAL NERVE BLOCK;  Surgeon: [name], MD;  Location: [place];  Service: Thoracic;  Laterality: Left;   NODE DISSECTION Left 10/15/2020   Procedure: NODE DISSECTION;  Surgeon: [name], MD;  Location: [place];  Service: Thoracic;  Laterality: Left;   ROBOTIC ASSITED PARTIAL NEPHRECTOMY Left 02/20/2019   Procedure: XI ROBOTIC ASSITED LAPAROSCOPIC ASSISTED RADICAL NEPHRECTOMY;  Surgeon: Raynelle Bring, MD;  Location: [facility];  Service: Urology;  Laterality: Left;   TONSILLECTOMY       Social History:   reports that he quit smoking about 33 years ago. His smoking use included cigarettes. He has a 20.00 pack-year smoking history. He has never used smokeless tobacco. He reports that he  does not currently use alcohol. He reports that he does not use drugs.   Family History:  His family history is not on file.   Allergies Allergies  Allergen Reactions   Codeine Hives    Nausea, vomiting, hives    Morphine And Related Hives    Hives, nausea and vomiting     Home Medications  Prior to Admission medications   Medication Sig Start Date End Date Taking? Authorizing Provider  albuterol (VENTOLIN HFA) 108 (90 Base) MCG/ACT inhaler Inhale 2 puffs into the lungs every 6 (six) hours as needed for wheezing or shortness of breath.  02/11/19   [provider]  apixaban (ELIQUIS) 5 MG TABS tablet Take 5 mg by mouth 2 (two) times daily.    [provider]  carvedilol (COREG) 12.5 MG tablet Take 12.5 mg by mouth 2 (two) times daily. 01/02/19   [provider]  furosemide (LASIX) 20 MG tablet Take 20 mg by mouth daily. 01/30/19   [provider]  prochlorperazine (COMPAZINE) 10 MG tablet TAKE 1 TABLET(10 MG) BY MOUTH EVERY 6 HOURS AS NEEDED FOR NAUSEA OR VOMITING 08/05/21   Wyatt Portela, MD  sacubitril-valsartan (ENTRESTO) 49-51 MG Take 1 tablet by mouth 2 (two) times daily. 04/13/17   [provider]  traMADol (ULTRAM) 50 MG tablet Take 1 tablet (50 mg total) by mouth every 6 (six) hours as needed (mild pain). 10/23/20   Barrett, Lodema Hong, PA-C     Critical care time: N/A

## 2021-12-20 NOTE — Care Management CC44 (Signed)
Condition Code 44 Documentation Completed  Patient Details  Name: Yoav Okane MRN: [id number] Date of Birth: [birth year]   Condition Code 44 given:  Yes Patient signature on Condition Code 44 notice:  Yes Documentation of 2 MD's agreement:  Yes Code 44 added to claim:  Yes    Roseanne Kaufman, RN 12/20/2021, 6:26 PM

## 2021-12-21 DIAGNOSIS — N1831 Chronic kidney disease, stage 3a: Secondary | ICD-10-CM | POA: Diagnosis not present

## 2021-12-21 DIAGNOSIS — I5032 Chronic diastolic (congestive) heart failure: Secondary | ICD-10-CM | POA: Diagnosis not present

## 2021-12-21 DIAGNOSIS — J449 Chronic obstructive pulmonary disease, unspecified: Secondary | ICD-10-CM | POA: Diagnosis not present

## 2021-12-21 DIAGNOSIS — J9621 Acute and chronic respiratory failure with hypoxia: Secondary | ICD-10-CM | POA: Diagnosis not present

## 2021-12-21 LAB — CULTURE, RESPIRATORY W GRAM STAIN

## 2021-12-21 NOTE — Progress Notes (Cosign Needed)
Pulmonary Critical Care Medicine [place]   PULMONARY CRITICAL CARE SERVICE  PROGRESS NOTE     Schneider Warchol  UVO:[id number]  DOB: 04/08/1950   DOA: 12/20/2021  Referring Physician: Satira Sark, MD  HPI: Kenneth Fischer is a 71 y.o. male being followed for ventilator/airway/oxygen weaning Acute on Chronic Respiratory Failure.  Patient seen lying in bed, currently on full support ventilation.  Versed drip titrated down per primary team.  Patient following all commands.  Has been tolerating pressure support the last couple days.  Recommendations continue to be with trach placement.  Medications: Reviewed on Rounds  Physical Exam:  Vitals: Temp 97.8, pulse 70, respirations 13, BP 112/61, SPO2 97%  Ventilator Settings AC VC, FiO2 30%, tidal volume 500, rate 16, PEEP 5  General: Comfortable at this time Neck: supple Cardiovascular: no malignant arrhythmias Respiratory: Bilaterally diminished Skin: no rash seen on limited exam Musculoskeletal: No gross abnormality Psychiatric:unable to assess Neurologic:no involuntary movements         Lab Data:   Basic Metabolic Panel: Recent Labs  Lab 12/17/21 0129 12/19/21 0120 12/20/21 0148  NA 140 144 147*  K 3.8 4.1 4.3  CL 104 104 105  CO2 31 32 33*  GLUCOSE 105* 171* 163*  BUN 59* 60* 62*  CREATININE 0.93 0.74 0.77  CALCIUM 7.9* 8.3* 8.3*  MG 2.1 2.5*  --     ABG: No results for input(s): "PHART", "PCO2ART", "PO2ART", "HCO3", "O2SAT" in the last 168 hours.  Liver Function Tests: No results for input(s): "AST", "ALT", "ALKPHOS", "BILITOT", "PROT", "ALBUMIN" in the last 168 hours. No results for input(s): "LIPASE", "AMYLASE" in the last 168 hours. No results for input(s): "AMMONIA" in the last 168 hours.  CBC: Recent Labs  Lab 12/17/21 0129 12/19/21 0120  WBC 6.4 6.1  HGB 11.7* 13.0  HCT 37.2* 41.2  MCV 88.4 88.0  PLT 132* 122*    Cardiac Enzymes: No results for input(s): "CKTOTAL",  "CKMB", "CKMBINDEX", "TROPONINI" in the last 168 hours.  BNP (last 3 results) No results for input(s): "BNP" in the last 8760 hours.  ProBNP (last 3 results) No results for input(s): "PROBNP" in the last 8760 hours.  Radiological Exams: DG Chest 1 View  Result Date: 12/20/2021 CLINICAL DATA:  Respiratory failure. EXAM: CHEST  1 VIEW COMPARISON:  Chest radiograph dated 12/19/2021. FINDINGS: Tracheostomy with tip approximately 6 cm above the carina. Feeding tube extends below the diaphragm with tip beyond the inferior margin of the image. There is background of emphysema. 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 An area of increased density in the right perihilar region may represent developing infiltrate. Small bilateral pleural effusions and bibasilar atelectasis/scarring. No pneumothorax. Stable cardiac silhouette. No acute osseous pathology. IMPRESSION: 1. Possible developing infiltrate in the right perihilar region. 2. Small bilateral pleural effusions. 3. Background of sarcoidosis. Electronically Signed   By: Anner Crete M.D.   On: 12/20/2021 20:20    Assessment/Plan Active Problems:   CKD (chronic kidney disease), stage III (HCC)   COPD, severe (HCC)   Acute on chronic respiratory failure with hypoxia (HCC)   Healthcare-associated pneumonia   Chronic heart failure with preserved ejection fraction (HCC)   Acute on chronic respiratory failure with hypoxia-continues to remain on full support ventilation.  Continue with ventilator bundle supportive care. Severe COPD-continue with medical management. Oral dysphagia-remains at high risk for aspiration. Chronic heart failure with preserved ejection fraction-appears to be compensated.  Continue monitoring fluid balance. CKD  stage IIIa supportive care monitor patient's lab work closely. Healthcare associated pneumonia has been treated with antibiotics   I have personally seen and  evaluated the patient, evaluated laboratory and imaging results, formulated the assessment and plan and placed orders. The Patient requires high complexity decision making with multiple systems involvement.  Rounds were done with the Respiratory Therapy Director and Staff therapists and discussed with nursing staff also.  Allyne Gee, MD [facility] Pulmonary Critical Care Medicine Sleep Medicine

## 2021-12-21 NOTE — Progress Notes (Cosign Needed)
Pulmonary Critical Care Medicine [place]   PULMONARY CRITICAL CARE SERVICE  PROGRESS NOTE     Kenneth Fischer  [id number]:[id number]  DOB: Jul 05, 1950   DOA: 12/20/2021  Referring Physician: Satira Sark, MD  HPI: Kenneth Fischer is a 71 y.o. male being followed for ventilator/airway/oxygen weaning Acute on Chronic Respiratory Failure.  Patient seen lying in bed, tracheostomy placed overnight.  Currently on pressure support with a 4-hour goal.  Tolerating well.  Medications: Reviewed on Rounds  Physical Exam:  Vitals: Temp 97.2, pulse 63, respirations 16, BP 125/71, SPO2 100%  Ventilator Settings pressure support 14/5 FiO2 30%  General: Comfortable at this time Neck: supple Cardiovascular: no malignant arrhythmias Respiratory: Bilaterally diminished Skin: no rash seen on limited exam Musculoskeletal: No gross abnormality Psychiatric:unable to assess Neurologic:no involuntary movements         Lab Data:   Basic Metabolic Panel: Recent Labs  Lab 12/17/21 0129 12/19/21 0120 12/20/21 0148  NA 140 144 147*  K 3.8 4.1 4.3  CL 104 104 105  CO2 31 32 33*  GLUCOSE 105* 171* 163*  BUN 59* 60* 62*  CREATININE 0.93 0.74 0.77  CALCIUM 7.9* 8.3* 8.3*  MG 2.1 2.5*  --     ABG: No results for input(s): "PHART", "PCO2ART", "PO2ART", "HCO3", "O2SAT" in the last 168 hours.  Liver Function Tests: No results for input(s): "AST", "ALT", "ALKPHOS", "BILITOT", "PROT", "ALBUMIN" in the last 168 hours. No results for input(s): "LIPASE", "AMYLASE" in the last 168 hours. No results for input(s): "AMMONIA" in the last 168 hours.  CBC: Recent Labs  Lab 12/17/21 0129 12/19/21 0120  WBC 6.4 6.1  HGB 11.7* 13.0  HCT 37.2* 41.2  MCV 88.4 88.0  PLT 132* 122*    Cardiac Enzymes: No results for input(s): "CKTOTAL", "CKMB", "CKMBINDEX", "TROPONINI" in the last 168 hours.  BNP (last 3 results) No results for input(s): "BNP" in the last 8760 hours.  ProBNP (last 3  results) No results for input(s): "PROBNP" in the last 8760 hours.  Radiological Exams: DG Chest 1 View  Result Date: 12/20/2021 CLINICAL DATA:  Respiratory failure. EXAM: CHEST  1 VIEW COMPARISON:  Chest radiograph dated 12/19/2021. FINDINGS: Tracheostomy with tip approximately 6 cm above the carina. Feeding tube extends below the diaphragm with tip beyond the inferior margin of the image. There is background of emphysema. 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 An area of increased density in the right perihilar region may represent developing infiltrate. Small bilateral pleural effusions and bibasilar atelectasis/scarring. No pneumothorax. Stable cardiac silhouette. No acute osseous pathology. IMPRESSION: 1. Possible developing infiltrate in the right perihilar region. 2. Small bilateral pleural effusions. 3. Background of sarcoidosis. Electronically Signed   By: Anner Crete M.D.   On: 12/20/2021 20:20    Assessment/Plan Active Problems:   CKD (chronic kidney disease), stage III (HCC)   COPD, severe (HCC)   Acute on chronic respiratory failure with hypoxia (HCC)   Healthcare-associated pneumonia   Chronic heart failure with preserved ejection fraction (HCC)  Acute on chronic respiratory failure with hypoxia-currently on pressure support with a 4-hour goal.  Continue with supportive care. Status post tracheostomy-was placed last night, remains high risk for decannulation.  Continue trach care per protocol. Severe COPD-continue with medical management. Oral dysphagia-remains at high risk for aspiration. Chronic heart failure with preserved ejection fraction-appears to be compensated.  Continue monitoring fluid balance.   I have personally seen and evaluated the patient, evaluated laboratory and  imaging results, formulated the assessment and plan and placed orders. The Patient requires high complexity decision making with multiple  systems involvement.  Rounds were done with the Respiratory Therapy Director and Staff therapists and discussed with nursing staff also.  Allyne Gee, MD [facility] Pulmonary Critical Care Medicine Sleep Medicine

## 2021-12-22 DIAGNOSIS — J449 Chronic obstructive pulmonary disease, unspecified: Secondary | ICD-10-CM | POA: Diagnosis not present

## 2021-12-22 DIAGNOSIS — J9621 Acute and chronic respiratory failure with hypoxia: Secondary | ICD-10-CM | POA: Diagnosis not present

## 2021-12-22 DIAGNOSIS — N1831 Chronic kidney disease, stage 3a: Secondary | ICD-10-CM | POA: Diagnosis not present

## 2021-12-22 DIAGNOSIS — I5032 Chronic diastolic (congestive) heart failure: Secondary | ICD-10-CM | POA: Diagnosis not present

## 2021-12-22 LAB — MAGNESIUM: Magnesium: 2.7 mg/dL — ABNORMAL HIGH (ref 1.7–2.4)

## 2021-12-22 LAB — CBC
HCT: 38 % — ABNORMAL LOW (ref 39.0–52.0)
Hemoglobin: 12.1 g/dL — ABNORMAL LOW (ref 13.0–17.0)
MCH: 28.3 pg (ref 26.0–34.0)
MCHC: 31.8 g/dL (ref 30.0–36.0)
MCV: 88.8 fL (ref 80.0–100.0)
Platelets: 103 10*3/uL — ABNORMAL LOW (ref 150–400)
RBC: 4.28 MIL/uL (ref 4.22–5.81)
RDW: 18.2 % — ABNORMAL HIGH (ref 11.5–15.5)
WBC: 4.1 10*3/uL (ref 4.0–10.5)
nRBC: 0 % (ref 0.0–0.2)

## 2021-12-22 LAB — BASIC METABOLIC PANEL
Anion gap: 6 (ref 5–15)
BUN: 56 mg/dL — ABNORMAL HIGH (ref 8–23)
CO2: 33 mmol/L — ABNORMAL HIGH (ref 22–32)
Calcium: 8.4 mg/dL — ABNORMAL LOW (ref 8.9–10.3)
Chloride: 108 mmol/L (ref 98–111)
Creatinine, Ser: 0.81 mg/dL (ref 0.61–1.24)
GFR, Estimated: >60 mL/min (ref >60)
Glucose, Bld: 139 mg/dL — ABNORMAL HIGH (ref 70–99)
Potassium: 4.7 mmol/L (ref 3.5–5.1)
Sodium: 147 mmol/L — ABNORMAL HIGH (ref 135–145)

## 2021-12-22 NOTE — Progress Notes (Signed)
Pulmonary Critical Care Medicine [place]   PULMONARY CRITICAL CARE SERVICE  PROGRESS NOTE     Kenneth Fischer  [id number]:[id number]  DOB: 02/23/1950   DOA: 12/20/2021  Referring Physician: Satira Sark, MD  HPI: Kenneth Fischer is a 71 y.o. male being followed for ventilator/airway/oxygen weaning Acute on Chronic Respiratory Failure.  Patient is weaning on pressure support this morning looks good has been tolerating advanced weaning  Medications: Reviewed on Rounds  Physical Exam:  Vitals: Temperature is 97.4 pulse 64 respiratory 16 blood pressure is 132/68 saturation 98%  Ventilator Settings on pressure support FiO2 is 28% tidal volume 398 PEEP 5 pressure support 12  General: Comfortable at this time Neck: supple Cardiovascular: no malignant arrhythmias Respiratory: Scattered rhonchi expansion is equal Skin: no rash seen on limited exam Musculoskeletal: No gross abnormality Psychiatric:unable to assess Neurologic:no involuntary movements         Lab Data:   Basic Metabolic Panel: Recent Labs  Lab 12/17/21 0129 12/19/21 0120 12/20/21 0148 12/22/21 0150  NA 140 144 147* 147*  K 3.8 4.1 4.3 4.7  CL 104 104 105 108  CO2 31 32 33* 33*  GLUCOSE 105* 171* 163* 139*  BUN 59* 60* 62* 56*  CREATININE 0.93 0.74 0.77 0.81  CALCIUM 7.9* 8.3* 8.3* 8.4*  MG 2.1 2.5*  --  2.7*    ABG: No results for input(s): "PHART", "PCO2ART", "PO2ART", "HCO3", "O2SAT" in the last 168 hours.  Liver Function Tests: No results for input(s): "AST", "ALT", "ALKPHOS", "BILITOT", "PROT", "ALBUMIN" in the last 168 hours. No results for input(s): "LIPASE", "AMYLASE" in the last 168 hours. No results for input(s): "AMMONIA" in the last 168 hours.  CBC: Recent Labs  Lab 12/17/21 0129 12/19/21 0120 12/22/21 0150  WBC 6.4 6.1 4.1  HGB 11.7* 13.0 12.1*  HCT 37.2* 41.2 38.0*  MCV 88.4 88.0 88.8  PLT 132* 122* 103*    Cardiac Enzymes: No results for input(s): "CKTOTAL",  "CKMB", "CKMBINDEX", "TROPONINI" in the last 168 hours.  BNP (last 3 results) No results for input(s): "BNP" in the last 8760 hours.  ProBNP (last 3 results) No results for input(s): "PROBNP" in the last 8760 hours.  Radiological Exams: DG Chest 1 View  Result Date: 12/20/2021 CLINICAL DATA:  Respiratory failure. EXAM: CHEST  1 VIEW COMPARISON:  Chest radiograph dated 12/19/2021. FINDINGS: Tracheostomy with tip approximately 6 cm above the carina. Feeding tube extends below the diaphragm with tip beyond the inferior margin of the image. There is background of emphysema. 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 An area of increased density in the right perihilar region may represent developing infiltrate. Small bilateral pleural effusions and bibasilar atelectasis/scarring. No pneumothorax. Stable cardiac silhouette. No acute osseous pathology. IMPRESSION: 1. Possible developing infiltrate in the right perihilar region. 2. Small bilateral pleural effusions. 3. Background of sarcoidosis. Electronically Signed   By: Anner Crete M.D.   On: 12/20/2021 20:20    Assessment/Plan Active Problems:   CKD (chronic kidney disease), stage III (HCC)   COPD, severe (HCC)   Acute on chronic respiratory failure with hypoxia (HCC)   Healthcare-associated pneumonia   Chronic heart failure with preserved ejection fraction (HCC)   Acute on chronic respiratory failure hypoxia we will continue with the weaning protocol doing well so far he is awake and alert Severe COPD medical management we will continue to follow along closely Healthcare associated pneumonia has been treated with antibiotics Chronic heart failure preserved ejection fraction  supportive care CKD stage IIIa monitoring patient's labs we will continue to follow   I have personally seen and evaluated the patient, evaluated laboratory and imaging results, formulated the assessment and plan and  placed orders. The Patient requires high complexity decision making with multiple systems involvement.  Rounds were done with the Respiratory Therapy Director and Staff therapists and discussed with nursing staff also.  Allyne Gee, MD [facility] Pulmonary Critical Care Medicine Sleep Medicine

## 2021-12-23 ENCOUNTER — Other Ambulatory Visit (HOSPITAL_COMMUNITY): Payer: Medicare Other

## 2021-12-23 DIAGNOSIS — N1831 Chronic kidney disease, stage 3a: Secondary | ICD-10-CM | POA: Diagnosis not present

## 2021-12-23 DIAGNOSIS — I5032 Chronic diastolic (congestive) heart failure: Secondary | ICD-10-CM | POA: Diagnosis not present

## 2021-12-23 DIAGNOSIS — J9621 Acute and chronic respiratory failure with hypoxia: Secondary | ICD-10-CM | POA: Diagnosis not present

## 2021-12-23 DIAGNOSIS — J449 Chronic obstructive pulmonary disease, unspecified: Secondary | ICD-10-CM | POA: Diagnosis not present

## 2021-12-23 NOTE — Progress Notes (Signed)
Pulmonary Critical Care Medicine [place]   PULMONARY CRITICAL CARE SERVICE  PROGRESS NOTE     Kenneth Fischer  SWN:[id number]  DOB: Jul 14, 1950   DOA: 12/20/2021  Referring Physician: Satira Sark, MD  HPI: Kenneth Fischer is a 71 y.o. male being followed for ventilator/airway/oxygen weaning Acute on Chronic Respiratory Failure.  Patient is on pressure support wean the goal is for 12 hours  Medications: Reviewed on Rounds  Physical Exam:  Vitals: Temperature is 96.4 pulse 73 respiratory rate is 12 blood pressure 125/68 saturations 95%  Ventilator Settings on pressure support FiO2 28%  General: Comfortable at this time Neck: supple Cardiovascular: no malignant arrhythmias Respiratory: Scattered rhonchi expansion equal Skin: no rash seen on limited exam Musculoskeletal: No gross abnormality Psychiatric:unable to assess Neurologic:no involuntary movements         Lab Data:   Basic Metabolic Panel: Recent Labs  Lab 12/17/21 0129 12/19/21 0120 12/20/21 0148 12/22/21 0150  NA 140 144 147* 147*  K 3.8 4.1 4.3 4.7  CL 104 104 105 108  CO2 31 32 33* 33*  GLUCOSE 105* 171* 163* 139*  BUN 59* 60* 62* 56*  CREATININE 0.93 0.74 0.77 0.81  CALCIUM 7.9* 8.3* 8.3* 8.4*  MG 2.1 2.5*  --  2.7*    ABG: No results for input(s): "PHART", "PCO2ART", "PO2ART", "HCO3", "O2SAT" in the last 168 hours.  Liver Function Tests: No results for input(s): "AST", "ALT", "ALKPHOS", "BILITOT", "PROT", "ALBUMIN" in the last 168 hours. No results for input(s): "LIPASE", "AMYLASE" in the last 168 hours. No results for input(s): "AMMONIA" in the last 168 hours.  CBC: Recent Labs  Lab 12/17/21 0129 12/19/21 0120 12/22/21 0150  WBC 6.4 6.1 4.1  HGB 11.7* 13.0 12.1*  HCT 37.2* 41.2 38.0*  MCV 88.4 88.0 88.8  PLT 132* 122* 103*    Cardiac Enzymes: No results for input(s): "CKTOTAL", "CKMB", "CKMBINDEX", "TROPONINI" in the last 168 hours.  BNP (last 3  results) No results for input(s): "BNP" in the last 8760 hours.  ProBNP (last 3 results) No results for input(s): "PROBNP" in the last 8760 hours.  Radiological Exams: DG CHEST PORT 1 VIEW  Result Date: 12/23/2021 CLINICAL DATA:  Pneumonia. EXAM: PORTABLE CHEST 1 VIEW COMPARISON:  12/20/21 FINDINGS: The tracheostomy tube tip is 6.7 cm above the carina. There is a feeding tube with tip below the GE junction. Stable cardiomediastinal contours. Unchanged small bilateral pleural effusions. There is been interval asymmetric left lower lung volume loss and opacification which likely represents a combination of left lower lobe atelectasis and airspace disease. Confluent scarring and architectural distortion within bilateral upper lobe perihilar regions appear unchanged. Associated upward retraction of the hilar structures noted. IMPRESSION: 1. Interval asymmetric left lower lobe volume loss and opacification which likely represents a combination of left lower lobe atelectasis and airspace disease. 2. Stable small bilateral pleural effusions. Electronically Signed   By: Kerby Moors M.D.   On: 12/23/2021 06:15    Assessment/Plan Active Problems:   CKD (chronic kidney disease), stage III (HCC)   COPD, severe (HCC)   Acute on chronic respiratory failure with hypoxia (HCC)   Healthcare-associated pneumonia   Chronic heart failure with preserved ejection fraction (HCC)   Acute on chronic respiratory failure hypoxia we will continue with weaning on pressure for goal of 12 hours Severe COPD medical management we will continue to follow along Chronic kidney disease stage III following labs and hydration status Healthcare associated pneumonia has been treated Chronic heart failure  preserved ejection fraction no change we will continue to follow along closely   I have personally seen and evaluated the patient, evaluated laboratory and imaging results, formulated the assessment and plan and placed  orders. The Patient requires high complexity decision making with multiple systems involvement.  Rounds were done with the Respiratory Therapy Director and Staff therapists and discussed with nursing staff also.  Allyne Gee, MD [facility] Pulmonary Critical Care Medicine Sleep Medicine

## 2021-12-24 DIAGNOSIS — N1831 Chronic kidney disease, stage 3a: Secondary | ICD-10-CM | POA: Diagnosis not present

## 2021-12-24 DIAGNOSIS — J9621 Acute and chronic respiratory failure with hypoxia: Secondary | ICD-10-CM | POA: Diagnosis not present

## 2021-12-24 DIAGNOSIS — I5032 Chronic diastolic (congestive) heart failure: Secondary | ICD-10-CM | POA: Diagnosis not present

## 2021-12-24 DIAGNOSIS — J449 Chronic obstructive pulmonary disease, unspecified: Secondary | ICD-10-CM | POA: Diagnosis not present

## 2021-12-24 LAB — CBC
HCT: 41.3 % (ref 39.0–52.0)
Hemoglobin: 13 g/dL (ref 13.0–17.0)
MCH: 28.6 pg (ref 26.0–34.0)
MCHC: 31.5 g/dL (ref 30.0–36.0)
MCV: 90.8 fL (ref 80.0–100.0)
Platelets: 89 10*3/uL — ABNORMAL LOW (ref 150–400)
RBC: 4.55 MIL/uL (ref 4.22–5.81)
RDW: 17.9 % — ABNORMAL HIGH (ref 11.5–15.5)
WBC: 2.6 10*3/uL — ABNORMAL LOW (ref 4.0–10.5)
nRBC: 0 % (ref 0.0–0.2)

## 2021-12-24 LAB — BASIC METABOLIC PANEL
Anion gap: 9 (ref 5–15)
BUN: 52 mg/dL — ABNORMAL HIGH (ref 8–23)
CO2: 30 mmol/L (ref 22–32)
Calcium: 8 mg/dL — ABNORMAL LOW (ref 8.9–10.3)
Chloride: 105 mmol/L (ref 98–111)
Creatinine, Ser: 0.74 mg/dL (ref 0.61–1.24)
GFR, Estimated: >60 mL/min (ref >60)
Glucose, Bld: 185 mg/dL — ABNORMAL HIGH (ref 70–99)
Potassium: 5.2 mmol/L — ABNORMAL HIGH (ref 3.5–5.1)
Sodium: 144 mmol/L (ref 135–145)

## 2021-12-24 NOTE — Progress Notes (Signed)
Pulmonary Critical Care Medicine [place]   PULMONARY CRITICAL CARE SERVICE  PROGRESS NOTE     Kenneth Fischer  ZOX:[id number]  DOB: 1950-06-02   DOA: 12/20/2021  Referring Physician: Satira Sark, MD  HPI: Kenneth Fischer is a 71 y.o. male being followed for ventilator/airway/oxygen weaning Acute on Chronic Respiratory Failure.  Patient currently is on pressure support mode has been on 40% FiO2 saturations are good  Medications: Reviewed on Rounds  Physical Exam:  Vitals: Temperature is 101.0 pulse 119 respiratory 20 blood pressure 160/79 saturations 93%  Ventilator Settings pressure support FiO2 40% pressure 12/5  General: Comfortable at this time Neck: supple Cardiovascular: no malignant arrhythmias Respiratory: No rhonchi no rales are noted at this time Skin: no rash seen on limited exam Musculoskeletal: No gross abnormality Psychiatric:unable to assess Neurologic:no involuntary movements         Lab Data:   Basic Metabolic Panel: Recent Labs  Lab 12/19/21 0120 12/20/21 0148 12/22/21 0150 12/24/21 0057  NA 144 147* 147* 144  K 4.1 4.3 4.7 5.2*  CL 104 105 108 105  CO2 32 33* 33* 30  GLUCOSE 171* 163* 139* 185*  BUN 60* 62* 56* 52*  CREATININE 0.74 0.77 0.81 0.74  CALCIUM 8.3* 8.3* 8.4* 8.0*  MG 2.5*  --  2.7*  --     ABG: No results for input(s): "PHART", "PCO2ART", "PO2ART", "HCO3", "O2SAT" in the last 168 hours.  Liver Function Tests: No results for input(s): "AST", "ALT", "ALKPHOS", "BILITOT", "PROT", "ALBUMIN" in the last 168 hours. No results for input(s): "LIPASE", "AMYLASE" in the last 168 hours. No results for input(s): "AMMONIA" in the last 168 hours.  CBC: Recent Labs  Lab 12/19/21 0120 12/22/21 0150 12/24/21 0057  WBC 6.1 4.1 2.6*  HGB 13.0 12.1* 13.0  HCT 41.2 38.0* 41.3  MCV 88.0 88.8 90.8  PLT 122* 103* 89*    Cardiac Enzymes: No results for input(s): "CKTOTAL", "CKMB", "CKMBINDEX", "TROPONINI" in the  last 168 hours.  BNP (last 3 results) No results for input(s): "BNP" in the last 8760 hours.  ProBNP (last 3 results) No results for input(s): "PROBNP" in the last 8760 hours.  Radiological Exams: DG CHEST PORT 1 VIEW  Result Date: 12/23/2021 CLINICAL DATA:  Pneumonia. EXAM: PORTABLE CHEST 1 VIEW COMPARISON:  12/20/21 FINDINGS: The tracheostomy tube tip is 6.7 cm above the carina. There is a feeding tube with tip below the GE junction. Stable cardiomediastinal contours. Unchanged small bilateral pleural effusions. There is been interval asymmetric left lower lung volume loss and opacification which likely represents a combination of left lower lobe atelectasis and airspace disease. Confluent scarring and architectural distortion within bilateral upper lobe perihilar regions appear unchanged. Associated upward retraction of the hilar structures noted. IMPRESSION: 1. Interval asymmetric left lower lobe volume loss and opacification which likely represents a combination of left lower lobe atelectasis and airspace disease. 2. Stable small bilateral pleural effusions. Electronically Signed   By: Kerby Moors M.D.   On: 12/23/2021 06:15    Assessment/Plan Active Problems:   CKD (chronic kidney disease), stage III (HCC)   COPD, severe (HCC)   Acute on chronic respiratory failure with hypoxia (HCC)   Healthcare-associated pneumonia   Chronic heart failure with preserved ejection fraction (HCC)   Acute on chronic respiratory failure with hypoxia continue to wean on pressure support goal of 16 hours we will continue to monitor closely. Severe COPD medical management we will continue with present therapy CKD stage III supportive care  Healthcare associated pneumonia has been treated clinically improving Chronic heart failure preserved ejection fraction no change continue to follow along   I have personally seen and evaluated the patient, evaluated laboratory and imaging results, formulated the  assessment and plan and placed orders. The Patient requires high complexity decision making with multiple systems involvement.  Rounds were done with the Respiratory Therapy Director and Staff therapists and discussed with nursing staff also.  Allyne Gee, MD [facility] Pulmonary Critical Care Medicine Sleep Medicine

## 2021-12-25 DIAGNOSIS — N1831 Chronic kidney disease, stage 3a: Secondary | ICD-10-CM | POA: Diagnosis not present

## 2021-12-25 DIAGNOSIS — J449 Chronic obstructive pulmonary disease, unspecified: Secondary | ICD-10-CM | POA: Diagnosis not present

## 2021-12-25 DIAGNOSIS — I5032 Chronic diastolic (congestive) heart failure: Secondary | ICD-10-CM | POA: Diagnosis not present

## 2021-12-25 DIAGNOSIS — J9621 Acute and chronic respiratory failure with hypoxia: Secondary | ICD-10-CM | POA: Diagnosis not present

## 2021-12-25 LAB — BASIC METABOLIC PANEL
Anion gap: 5 (ref 5–15)
BUN: 53 mg/dL — ABNORMAL HIGH (ref 8–23)
CO2: 34 mmol/L — ABNORMAL HIGH (ref 22–32)
Calcium: 8.1 mg/dL — ABNORMAL LOW (ref 8.9–10.3)
Chloride: 106 mmol/L (ref 98–111)
Creatinine, Ser: 0.69 mg/dL (ref 0.61–1.24)
GFR, Estimated: >60 mL/min (ref >60)
Glucose, Bld: 153 mg/dL — ABNORMAL HIGH (ref 70–99)
Potassium: 5.1 mmol/L (ref 3.5–5.1)
Sodium: 145 mmol/L (ref 135–145)

## 2021-12-25 NOTE — Progress Notes (Signed)
Pulmonary Critical Care Medicine [place]   PULMONARY CRITICAL CARE SERVICE  PROGRESS NOTE     Kenneth Fischer  [id number]:[id number]  DOB: 08-27-1950   DOA: 12/20/2021  Referring Physician: Satira Sark, MD  HPI: Kenneth Fischer is a 71 y.o. male being followed for ventilator/airway/oxygen weaning Acute on Chronic Respiratory Failure.  Patient currently is on T collar goal is for 2 hours  Medications: Reviewed on Rounds  Physical Exam:  Vitals: Temperature is 97.1 pulse 117 respiratory respiratory blood pressure 146/70 saturations 100%  Ventilator Settings T collar off the ventilator  General: Comfortable at this time Neck: supple Cardiovascular: no malignant arrhythmias Respiratory: No rhonchi no rales are noted at this time Skin: no rash seen on limited exam Musculoskeletal: No gross abnormality Psychiatric:unable to assess Neurologic:no involuntary movements         Lab Data:   Basic Metabolic Panel: Recent Labs  Lab 12/19/21 0120 12/20/21 0148 12/22/21 0150 12/24/21 0057 12/25/21 0053  NA 144 147* 147* 144 145  K 4.1 4.3 4.7 5.2* 5.1  CL 104 105 108 105 106  CO2 32 33* 33* 30 34*  GLUCOSE 171* 163* 139* 185* 153*  BUN 60* 62* 56* 52* 53*  CREATININE 0.74 0.77 0.81 0.74 0.69  CALCIUM 8.3* 8.3* 8.4* 8.0* 8.1*  MG 2.5*  --  2.7*  --   --     ABG: No results for input(s): "PHART", "PCO2ART", "PO2ART", "HCO3", "O2SAT" in the last 168 hours.  Liver Function Tests: No results for input(s): "AST", "ALT", "ALKPHOS", "BILITOT", "PROT", "ALBUMIN" in the last 168 hours. No results for input(s): "LIPASE", "AMYLASE" in the last 168 hours. No results for input(s): "AMMONIA" in the last 168 hours.  CBC: Recent Labs  Lab 12/19/21 0120 12/22/21 0150 12/24/21 0057  WBC 6.1 4.1 2.6*  HGB 13.0 12.1* 13.0  HCT 41.2 38.0* 41.3  MCV 88.0 88.8 90.8  PLT 122* 103* 89*    Cardiac Enzymes: No results for input(s): "CKTOTAL", "CKMB", "CKMBINDEX",  "TROPONINI" in the last 168 hours.  BNP (last 3 results) No results for input(s): "BNP" in the last 8760 hours.  ProBNP (last 3 results) No results for input(s): "PROBNP" in the last 8760 hours.  Radiological Exams: No results found.  Assessment/Plan Active Problems:   CKD (chronic kidney disease), stage III (HCC)   COPD, severe (HCC)   Acute on chronic respiratory failure with hypoxia (HCC)   Healthcare-associated pneumonia   Chronic heart failure with preserved ejection fraction (HCC)   Acute on chronic respiratory failure hypoxia plan is to continue with the weaning on T collar trials.  The goal is for 2 hours Severe COPD medical management we will continue to follow Chronic kidney disease stage III supportive care we will continue to monitor Healthcare associated pneumonia has been treated with antibiotics Chronic heart failure preserved ejection fraction monitor patient's fluid status   I have personally seen and evaluated the patient, evaluated laboratory and imaging results, formulated the assessment and plan and placed orders. The Patient requires high complexity decision making with multiple systems involvement.  Rounds were done with the Respiratory Therapy Director and Staff therapists and discussed with nursing staff also.  Allyne Gee, MD [facility] Pulmonary Critical Care Medicine Sleep Medicine

## 2021-12-26 DIAGNOSIS — J9621 Acute and chronic respiratory failure with hypoxia: Secondary | ICD-10-CM | POA: Diagnosis not present

## 2021-12-26 DIAGNOSIS — I5032 Chronic diastolic (congestive) heart failure: Secondary | ICD-10-CM | POA: Diagnosis not present

## 2021-12-26 DIAGNOSIS — N1831 Chronic kidney disease, stage 3a: Secondary | ICD-10-CM | POA: Diagnosis not present

## 2021-12-26 DIAGNOSIS — J449 Chronic obstructive pulmonary disease, unspecified: Secondary | ICD-10-CM | POA: Diagnosis not present

## 2021-12-26 NOTE — Progress Notes (Signed)
Pulmonary Critical Care Medicine [place]   PULMONARY CRITICAL CARE SERVICE  PROGRESS NOTE     Kenneth Fischer  XNA:[id number]  DOB: 19-Jun-1950   DOA: 12/20/2021  Referring Physician: Satira Sark, MD  HPI: Kenneth Fischer is a 71 y.o. male being followed for ventilator/airway/oxygen weaning Acute on Chronic Respiratory Failure.  At this time patient is on assist control mode has been on 35% FiO2 with tidal volume of 500 with a PEEP of 5  Medications: Reviewed on Rounds  Physical Exam:  Vitals: Temperature is 97.2 pulse 75 respiratory rate is 20 blood pressure 117/67 saturation 96%  Ventilator Settings on assist control mode on 35% FiO2 tidal volume 500 PEEP 5  General: Comfortable at this time Neck: supple Cardiovascular: no malignant arrhythmias Respiratory: No rhonchi no rales are noted at this time Skin: no rash seen on limited exam Musculoskeletal: No gross abnormality Psychiatric:unable to assess Neurologic:no involuntary movements         Lab Data:   Basic Metabolic Panel: Recent Labs  Lab 12/20/21 0148 12/22/21 0150 12/24/21 0057 12/25/21 0053  NA 147* 147* 144 145  K 4.3 4.7 5.2* 5.1  CL 105 108 105 106  CO2 33* 33* 30 34*  GLUCOSE 163* 139* 185* 153*  BUN 62* 56* 52* 53*  CREATININE 0.77 0.81 0.74 0.69  CALCIUM 8.3* 8.4* 8.0* 8.1*  MG  --  2.7*  --   --     ABG: No results for input(s): "PHART", "PCO2ART", "PO2ART", "HCO3", "O2SAT" in the last 168 hours.  Liver Function Tests: No results for input(s): "AST", "ALT", "ALKPHOS", "BILITOT", "PROT", "ALBUMIN" in the last 168 hours. No results for input(s): "LIPASE", "AMYLASE" in the last 168 hours. No results for input(s): "AMMONIA" in the last 168 hours.  CBC: Recent Labs  Lab 12/22/21 0150 12/24/21 0057  WBC 4.1 2.6*  HGB 12.1* 13.0  HCT 38.0* 41.3  MCV 88.8 90.8  PLT 103* 89*    Cardiac Enzymes: No results for input(s): "CKTOTAL", "CKMB", "CKMBINDEX", "TROPONINI"  in the last 168 hours.  BNP (last 3 results) No results for input(s): "BNP" in the last 8760 hours.  ProBNP (last 3 results) No results for input(s): "PROBNP" in the last 8760 hours.  Radiological Exams: No results found.  Assessment/Plan Active Problems:   CKD (chronic kidney disease), stage III (HCC)   COPD, severe (HCC)   Acute on chronic respiratory failure with hypoxia (HCC)   Healthcare-associated pneumonia   Chronic heart failure with preserved ejection fraction (HCC)   Acute on chronic respiratory failure hypoxia we will continue with full support on the ventilator.  Patient has not been tolerating weaning so far this morning yesterday's goal was T collar for 2 hours we will plan respiratory therapy reassess Severe COPD medical management we will continue to follow along closely Chronic kidney disease following patient's labs we will continue supportive care Healthcare associated pneumonia at baseline Chronic heart failure preserved ejection fraction no change we will continue to follow along   I have personally seen and evaluated the patient, evaluated laboratory and imaging results, formulated the assessment and plan and placed orders. The Patient requires high complexity decision making with multiple systems involvement.  Rounds were done with the Respiratory Therapy Director and Staff therapists and discussed with nursing staff also.  Allyne Gee, MD [facility] Pulmonary Critical Care Medicine Sleep Medicine

## 2021-12-27 DIAGNOSIS — J449 Chronic obstructive pulmonary disease, unspecified: Secondary | ICD-10-CM | POA: Diagnosis not present

## 2021-12-27 DIAGNOSIS — I5032 Chronic diastolic (congestive) heart failure: Secondary | ICD-10-CM | POA: Diagnosis not present

## 2021-12-27 DIAGNOSIS — N1831 Chronic kidney disease, stage 3a: Secondary | ICD-10-CM | POA: Diagnosis not present

## 2021-12-27 DIAGNOSIS — J9621 Acute and chronic respiratory failure with hypoxia: Secondary | ICD-10-CM | POA: Diagnosis not present

## 2021-12-27 LAB — BASIC METABOLIC PANEL
Anion gap: 9 (ref 5–15)
BUN: 47 mg/dL — ABNORMAL HIGH (ref 8–23)
CO2: 30 mmol/L (ref 22–32)
Calcium: 8.2 mg/dL — ABNORMAL LOW (ref 8.9–10.3)
Chloride: 105 mmol/L (ref 98–111)
Creatinine, Ser: 0.6 mg/dL — ABNORMAL LOW (ref 0.61–1.24)
GFR, Estimated: >60 mL/min (ref >60)
Glucose, Bld: 132 mg/dL — ABNORMAL HIGH (ref 70–99)
Potassium: 5 mmol/L (ref 3.5–5.1)
Sodium: 144 mmol/L (ref 135–145)

## 2021-12-27 LAB — CBC
HCT: 35.9 % — ABNORMAL LOW (ref 39.0–52.0)
Hemoglobin: 11.4 g/dL — ABNORMAL LOW (ref 13.0–17.0)
MCH: 28.6 pg (ref 26.0–34.0)
MCHC: 31.8 g/dL (ref 30.0–36.0)
MCV: 90 fL (ref 80.0–100.0)
Platelets: 103 10*3/uL — ABNORMAL LOW (ref 150–400)
RBC: 3.99 MIL/uL — ABNORMAL LOW (ref 4.22–5.81)
RDW: 17.6 % — ABNORMAL HIGH (ref 11.5–15.5)
WBC: 2.5 10*3/uL — ABNORMAL LOW (ref 4.0–10.5)
nRBC: 0 % (ref 0.0–0.2)

## 2021-12-27 LAB — MAGNESIUM: Magnesium: 2.4 mg/dL (ref 1.7–2.4)

## 2021-12-27 NOTE — Progress Notes (Signed)
Pulmonary Critical Care Medicine [place]   PULMONARY CRITICAL CARE SERVICE  PROGRESS NOTE     Nahome Bublitz  AST:[id number]  DOB: 12-24-1950   DOA: 12/20/2021  Referring Physician: Satira Sark, MD  HPI: Volney Reierson is a 71 y.o. male being followed for ventilator/airway/oxygen weaning Acute on Chronic Respiratory Failure.  Patient resting comfortably without distress at this time has been on T collar currently on 40% FiO2  Medications: Reviewed on Rounds  Physical Exam:  Vitals: Temperature is 96.9 pulse 76 respiratory rate is 16 blood pressure 117/65 saturation 98%  Ventilator Settings on T collar FiO2 is 40%  General: Comfortable at this time Neck: supple Cardiovascular: no malignant arrhythmias Respiratory: No rhonchi no rales are noted at this time Skin: no rash seen on limited exam Musculoskeletal: No gross abnormality Psychiatric:unable to assess Neurologic:no involuntary movements         Lab Data:   Basic Metabolic Panel: Recent Labs  Lab 12/22/21 0150 12/24/21 0057 12/25/21 0053 12/27/21 0138  NA 147* 144 145 144  K 4.7 5.2* 5.1 5.0  CL 108 105 106 105  CO2 33* 30 34* 30  GLUCOSE 139* 185* 153* 132*  BUN 56* 52* 53* 47*  CREATININE 0.81 0.74 0.69 0.60*  CALCIUM 8.4* 8.0* 8.1* 8.2*  MG 2.7*  --   --  2.4    ABG: No results for input(s): "PHART", "PCO2ART", "PO2ART", "HCO3", "O2SAT" in the last 168 hours.  Liver Function Tests: No results for input(s): "AST", "ALT", "ALKPHOS", "BILITOT", "PROT", "ALBUMIN" in the last 168 hours. No results for input(s): "LIPASE", "AMYLASE" in the last 168 hours. No results for input(s): "AMMONIA" in the last 168 hours.  CBC: Recent Labs  Lab 12/22/21 0150 12/24/21 0057 12/27/21 0138  WBC 4.1 2.6* 2.5*  HGB 12.1* 13.0 11.4*  HCT 38.0* 41.3 35.9*  MCV 88.8 90.8 90.0  PLT 103* 89* 103*    Cardiac Enzymes: No results for input(s): "CKTOTAL", "CKMB", "CKMBINDEX", "TROPONINI" in  the last 168 hours.  BNP (last 3 results) No results for input(s): "BNP" in the last 8760 hours.  ProBNP (last 3 results) No results for input(s): "PROBNP" in the last 8760 hours.  Radiological Exams: No results found.  Assessment/Plan Active Problems:   CKD (chronic kidney disease), stage III (HCC)   COPD, severe (HCC)   Acute on chronic respiratory failure with hypoxia (HCC)   Healthcare-associated pneumonia   Chronic heart failure with preserved ejection fraction (HCC)   Acute on chronic respiratory failure hypoxia we will continue with T collar today's goal is 2 hours Severe COPD medical management continue to monitor CKD stage III supportive care monitor labs Healthcare associated pneumonia has been treated we will continue to follow along Chronic heart failure preserved ejection fraction overall no change   I have personally seen and evaluated the patient, evaluated laboratory and imaging results, formulated the assessment and plan and placed orders. The Patient requires high complexity decision making with multiple systems involvement.  Rounds were done with the Respiratory Therapy Director and Staff therapists and discussed with nursing staff also.  Allyne Gee, MD [facility] Pulmonary Critical Care Medicine Sleep Medicine

## 2021-12-28 ENCOUNTER — Other Ambulatory Visit (HOSPITAL_COMMUNITY): Payer: Medicare Other

## 2021-12-28 DIAGNOSIS — I5032 Chronic diastolic (congestive) heart failure: Secondary | ICD-10-CM | POA: Diagnosis not present

## 2021-12-28 DIAGNOSIS — J449 Chronic obstructive pulmonary disease, unspecified: Secondary | ICD-10-CM | POA: Diagnosis not present

## 2021-12-28 DIAGNOSIS — J9621 Acute and chronic respiratory failure with hypoxia: Secondary | ICD-10-CM | POA: Diagnosis not present

## 2021-12-28 DIAGNOSIS — N1831 Chronic kidney disease, stage 3a: Secondary | ICD-10-CM | POA: Diagnosis not present

## 2021-12-28 LAB — BASIC METABOLIC PANEL
Anion gap: 7 (ref 5–15)
BUN: 43 mg/dL — ABNORMAL HIGH (ref 8–23)
CO2: 29 mmol/L (ref 22–32)
Calcium: 7.9 mg/dL — ABNORMAL LOW (ref 8.9–10.3)
Chloride: 105 mmol/L (ref 98–111)
Creatinine, Ser: 0.64 mg/dL (ref 0.61–1.24)
GFR, Estimated: >60 mL/min (ref >60)
Glucose, Bld: 124 mg/dL — ABNORMAL HIGH (ref 70–99)
Potassium: 4.8 mmol/L (ref 3.5–5.1)
Sodium: 141 mmol/L (ref 135–145)

## 2021-12-28 NOTE — Progress Notes (Signed)
Pulmonary Critical Care Medicine [place]   PULMONARY CRITICAL CARE SERVICE  PROGRESS NOTE     Kenneth Fischer  KVQ:[id number]  DOB: Feb 22, 1950   DOA: 12/20/2021  Referring Physician: Satira Sark, MD  HPI: Kenneth Fischer is a 71 y.o. male being followed for ventilator/airway/oxygen weaning Acute on Chronic Respiratory Failure. ***  Medications: Reviewed on Rounds  Physical Exam:  Vitals: ***  Ventilator Settings ***  General: Comfortable at this time Neck: supple Cardiovascular: no malignant arrhythmias Respiratory: *** Skin: no rash seen on limited exam Musculoskeletal: No gross abnormality Psychiatric:unable to assess Neurologic:no involuntary movements         Lab Data:   Basic Metabolic Panel: Recent Labs  Lab 12/22/21 0150 12/24/21 0057 12/25/21 0053 12/27/21 0138 12/28/21 0152  NA 147* 144 145 144 141  K 4.7 5.2* 5.1 5.0 4.8  CL 108 105 106 105 105  CO2 33* 30 34* 30 29  GLUCOSE 139* 185* 153* 132* 124*  BUN 56* 52* 53* 47* 43*  CREATININE 0.81 0.74 0.69 0.60* 0.64  CALCIUM 8.4* 8.0* 8.1* 8.2* 7.9*  MG 2.7*  --   --  2.4  --     ABG: No results for input(s): "PHART", "PCO2ART", "PO2ART", "HCO3", "O2SAT" in the last 168 hours.  Liver Function Tests: No results for input(s): "AST", "ALT", "ALKPHOS", "BILITOT", "PROT", "ALBUMIN" in the last 168 hours. No results for input(s): "LIPASE", "AMYLASE" in the last 168 hours. No results for input(s): "AMMONIA" in the last 168 hours.  CBC: Recent Labs  Lab 12/22/21 0150 12/24/21 0057 12/27/21 0138  WBC 4.1 2.6* 2.5*  HGB 12.1* 13.0 11.4*  HCT 38.0* 41.3 35.9*  MCV 88.8 90.8 90.0  PLT 103* 89* 103*    Cardiac Enzymes: No results for input(s): "CKTOTAL", "CKMB", "CKMBINDEX", "TROPONINI" in the last 168 hours.  BNP (last 3 results) No results for input(s): "BNP" in the last 8760 hours.  ProBNP (last 3 results) No results for input(s): "PROBNP" in the last 8760  hours.  Radiological Exams: CT CHEST WO CONTRAST  Result Date: 12/28/2021 CLINICAL DATA:  Pleural effusion. EXAM: CT CHEST WITHOUT CONTRAST TECHNIQUE: Multidetector CT imaging of the chest was performed following the standard protocol without IV contrast. RADIATION DOSE REDUCTION: This exam was performed according to the departmental dose-optimization program which includes automated exposure control, adjustment of the mA and/or kV according to patient size and/or use of iterative reconstruction technique. COMPARISON:  July 20, 2021. FINDINGS: Cardiovascular: No evidence of thoracic aortic aneurysm. Normal cardiac size. No pericardial effusion. Mild coronary calcifications are noted. Mediastinum/Nodes: Tracheostomy tube is in grossly good position. Nasogastric tube is seen passing through the esophagus into the stomach. Calcified bilateral hilar and subcarinal lymph nodes are noted most consistent with prior granulomatous disease. Thyroid gland is unremarkable. Lungs/Pleura: Moderate size loculated right pleural effusion is noted which is increased compared to prior exam. Minimal left pleural effusion is noted with adjacent subsegmental atelectasis. No pneumothorax is noted. Increased right lower lobe opacity is noted concerning for atelectasis or possibly pneumonia. Upper Abdomen: Feeding tube is seen entering stomach. Musculoskeletal: No chest wall mass or suspicious bone lesions identified. IMPRESSION: Moderate size loculated right pleural effusion is noted which is increased compared to prior exam. Increased right lower lobe opacity is noted concerning for atelectasis or possibly pneumonia. Minimal left pleural effusion is noted with associated subsegmental atelectasis. Calcified mediastinal adenopathy is noted most likely due to prior granulomatous disease. Mild coronary artery calcifications are noted. Electronically Signed  By: Marijo Conception M.D.   On: 12/28/2021 17:42    Assessment/Plan Active  Problems:   CKD (chronic kidney disease), stage III (HCC)   COPD, severe (HCC)   Acute on chronic respiratory failure with hypoxia (HCC)   Healthcare-associated pneumonia   Chronic heart failure with preserved ejection fraction (HCC)   *** *** *** *** ***   I have personally seen and evaluated the patient, evaluated laboratory and imaging results, formulated the assessment and plan and placed orders. The Patient requires high complexity decision making with multiple systems involvement.  Rounds were done with the Respiratory Therapy Director and Staff therapists and discussed with nursing staff also.  Allyne Gee, MD [facility] Pulmonary Critical Care Medicine Sleep Medicine

## 2021-12-29 DIAGNOSIS — J449 Chronic obstructive pulmonary disease, unspecified: Secondary | ICD-10-CM | POA: Diagnosis not present

## 2021-12-29 DIAGNOSIS — I5032 Chronic diastolic (congestive) heart failure: Secondary | ICD-10-CM | POA: Diagnosis not present

## 2021-12-29 DIAGNOSIS — J9621 Acute and chronic respiratory failure with hypoxia: Secondary | ICD-10-CM | POA: Diagnosis not present

## 2021-12-29 DIAGNOSIS — N1831 Chronic kidney disease, stage 3a: Secondary | ICD-10-CM | POA: Diagnosis not present

## 2021-12-30 ENCOUNTER — Other Ambulatory Visit (HOSPITAL_COMMUNITY): Payer: Medicare Other

## 2021-12-30 DIAGNOSIS — J9621 Acute and chronic respiratory failure with hypoxia: Secondary | ICD-10-CM | POA: Diagnosis not present

## 2021-12-30 DIAGNOSIS — J449 Chronic obstructive pulmonary disease, unspecified: Secondary | ICD-10-CM | POA: Diagnosis not present

## 2021-12-30 DIAGNOSIS — N1831 Chronic kidney disease, stage 3a: Secondary | ICD-10-CM | POA: Diagnosis not present

## 2021-12-30 DIAGNOSIS — I5032 Chronic diastolic (congestive) heart failure: Secondary | ICD-10-CM | POA: Diagnosis not present

## 2021-12-30 HISTORY — PX: IR THORACENTESIS ASP PLEURAL SPACE W/IMG GUIDE: IMG5380

## 2021-12-30 LAB — GLUCOSE, PLEURAL OR PERITONEAL FLUID: Glucose, Fluid: 112 mg/dL

## 2021-12-30 LAB — CBC
HCT: 37.2 % — ABNORMAL LOW (ref 39.0–52.0)
Hemoglobin: 11.5 g/dL — ABNORMAL LOW (ref 13.0–17.0)
MCH: 28 pg (ref 26.0–34.0)
MCHC: 30.9 g/dL (ref 30.0–36.0)
MCV: 90.7 fL (ref 80.0–100.0)
Platelets: 143 10*3/uL — ABNORMAL LOW (ref 150–400)
RBC: 4.1 MIL/uL — ABNORMAL LOW (ref 4.22–5.81)
RDW: 17.2 % — ABNORMAL HIGH (ref 11.5–15.5)
WBC: 3.3 10*3/uL — ABNORMAL LOW (ref 4.0–10.5)
nRBC: 0 % (ref 0.0–0.2)

## 2021-12-30 LAB — BASIC METABOLIC PANEL
Anion gap: 8 (ref 5–15)
BUN: 41 mg/dL — ABNORMAL HIGH (ref 8–23)
CO2: 31 mmol/L (ref 22–32)
Calcium: 8 mg/dL — ABNORMAL LOW (ref 8.9–10.3)
Chloride: 101 mmol/L (ref 98–111)
Creatinine, Ser: 0.7 mg/dL (ref 0.61–1.24)
GFR, Estimated: >60 mL/min (ref >60)
Glucose, Bld: 138 mg/dL — ABNORMAL HIGH (ref 70–99)
Potassium: 4.7 mmol/L (ref 3.5–5.1)
Sodium: 140 mmol/L (ref 135–145)

## 2021-12-30 LAB — GRAM STAIN

## 2021-12-30 LAB — PROTEIN, PLEURAL OR PERITONEAL FLUID: Total protein, fluid: <3 g/dL

## 2021-12-30 MED ORDER — LIDOCAINE HCL 1 % IJ SOLN
INTRAMUSCULAR | Status: AC
  Administered 2021-12-30: 8 mL
  Filled 2021-12-30: qty 20

## 2021-12-30 NOTE — Progress Notes (Incomplete)
Pulmonary Critical Care Medicine [place]   PULMONARY CRITICAL CARE SERVICE  PROGRESS NOTE     Gay Rape  VOH:[id number]  DOB: 1950-05-25   DOA: 12/20/2021  Referring Physician: Satira Sark, MD  HPI: Kiah Keay is a 71 y.o. male being followed for ventilator/airway/oxygen weaning Acute on Chronic Respiratory Failure. ***  Medications: Reviewed on Rounds  Physical Exam:  Vitals: ***  Ventilator Settings ***  General: Comfortable at this time Neck: supple Cardiovascular: no malignant arrhythmias Respiratory: *** Skin: no rash seen on limited exam Musculoskeletal: No gross abnormality Psychiatric:unable to assess Neurologic:no involuntary movements         Lab Data:   Basic Metabolic Panel: Recent Labs  Lab 12/24/21 0057 12/25/21 0053 12/27/21 0138 12/28/21 0152 12/30/21 0209  NA 144 145 144 141 140  K 5.2* 5.1 5.0 4.8 4.7  CL 105 106 105 105 101  CO2 30 34* '30 29 31  '$ GLUCOSE 185* 153* 132* 124* 138*  BUN 52* 53* 47* 43* 41*  CREATININE 0.74 0.69 0.60* 0.64 0.70  CALCIUM 8.0* 8.1* 8.2* 7.9* 8.0*  MG  --   --  2.4  --   --     ABG: No results for input(s): "PHART", "PCO2ART", "PO2ART", "HCO3", "O2SAT" in the last 168 hours.  Liver Function Tests: No results for input(s): "AST", "ALT", "ALKPHOS", "BILITOT", "PROT", "ALBUMIN" in the last 168 hours. No results for input(s): "LIPASE", "AMYLASE" in the last 168 hours. No results for input(s): "AMMONIA" in the last 168 hours.  CBC: Recent Labs  Lab 12/24/21 0057 12/27/21 0138 12/30/21 0209  WBC 2.6* 2.5* 3.3*  HGB 13.0 11.4* 11.5*  HCT 41.3 35.9* 37.2*  MCV 90.8 90.0 90.7  PLT 89* 103* 143*    Cardiac Enzymes: No results for input(s): "CKTOTAL", "CKMB", "CKMBINDEX", "TROPONINI" in the last 168 hours.  BNP (last 3 results) No results for input(s): "BNP" in the last 8760 hours.  ProBNP (last 3 results) No results for input(s): "PROBNP" in the last 8760  hours.  Radiological Exams: CT CHEST WO CONTRAST  Result Date: 12/28/2021 CLINICAL DATA:  Pleural effusion. EXAM: CT CHEST WITHOUT CONTRAST TECHNIQUE: Multidetector CT imaging of the chest was performed following the standard protocol without IV contrast. RADIATION DOSE REDUCTION: This exam was performed according to the departmental dose-optimization program which includes automated exposure control, adjustment of the mA and/or kV according to patient size and/or use of iterative reconstruction technique. COMPARISON:  July 20, 2021. FINDINGS: Cardiovascular: No evidence of thoracic aortic aneurysm. Normal cardiac size. No pericardial effusion. Mild coronary calcifications are noted. Mediastinum/Nodes: Tracheostomy tube is in grossly good position. Nasogastric tube is seen passing through the esophagus into the stomach. Calcified bilateral hilar and subcarinal lymph nodes are noted most consistent with prior granulomatous disease. Thyroid gland is unremarkable. Lungs/Pleura: Moderate size loculated right pleural effusion is noted which is increased compared to prior exam. Minimal left pleural effusion is noted with adjacent subsegmental atelectasis. No pneumothorax is noted. Increased right lower lobe opacity is noted concerning for atelectasis or possibly pneumonia. Upper Abdomen: Feeding tube is seen entering stomach. Musculoskeletal: No chest wall mass or suspicious bone lesions identified. IMPRESSION: Moderate size loculated right pleural effusion is noted which is increased compared to prior exam. Increased right lower lobe opacity is noted concerning for atelectasis or possibly pneumonia. Minimal left pleural effusion is noted with associated subsegmental atelectasis. Calcified mediastinal adenopathy is noted most likely due to prior granulomatous disease. Mild coronary artery calcifications are noted. Electronically  Signed   By: Marijo Conception M.D.   On: 12/28/2021 17:42    Assessment/Plan Active  Problems:   CKD (chronic kidney disease), stage III (HCC)   COPD, severe (HCC)   Acute on chronic respiratory failure with hypoxia (HCC)   Healthcare-associated pneumonia   Chronic heart failure with preserved ejection fraction (HCC)   Acute on chronic respiratory failure hypoxia- we will continue with T collar wean as tolerated.  Successfully completed 4-hour ATC goal yesterday, currently working on 8-hour ATC goal today. Severe COPD-continue with medical management we will continue to monitor CKD stage III-continue supportive care Healthcare associated pneumonia- has been treated Chronic heart failure preserved ejection fraction -no change we will continue to monitor  I have personally seen and evaluated the patient, evaluated laboratory and imaging results, formulated the assessment and plan and placed orders. The Patient requires high complexity decision making with multiple systems involvement.  Rounds were done with the Respiratory Therapy Director and Staff therapists and discussed with nursing staff also.  Allyne Gee, MD [facility] Pulmonary Critical Care Medicine Sleep Medicine

## 2021-12-30 NOTE — Procedures (Signed)
PROCEDURE SUMMARY:  Successful US guided right thoracentesis. Yielded 800cc of yellow pleural fluid. Pt tolerated procedure well. No immediate complications.  Specimen was sent for labs. CXR ordered.  EBL < 5 mL  Natahlia Hoggard PA-C 12/30/2021 1:16 PM

## 2021-12-30 NOTE — Progress Notes (Cosign Needed)
Pulmonary Critical Care Medicine [place]   PULMONARY CRITICAL CARE SERVICE  PROGRESS NOTE     Kenneth Fischer  [id number]:[id number]  DOB: 1950/10/12   DOA: 12/20/2021  Referring Physician: Satira Sark, MD  HPI: Kenneth Fischer is a 71 y.o. male being followed for ventilator/airway/oxygen weaning Acute on Chronic Respiratory Failure.  Patient seen lying in bed, currently on ATC 40%.  Was able to complete 4 hours of ATC yesterday, currently working on 8-hour goal today.  Medications: Reviewed on Rounds  Physical Exam:  Vitals: Temp 96.9, pulse 71, respirations 16, BP 132/69, SPO2 96%  Ventilator Settings ATC 40%  General: Comfortable at this time Neck: supple Cardiovascular: no malignant arrhythmias Respiratory: Bilaterally diminished Skin: no rash seen on limited exam Musculoskeletal: No gross abnormality Psychiatric:unable to assess Neurologic:no involuntary movements         Lab Data:   Basic Metabolic Panel: Recent Labs  Lab 12/24/21 0057 12/25/21 0053 12/27/21 0138 12/28/21 0152 12/30/21 0209  NA 144 145 144 141 140  K 5.2* 5.1 5.0 4.8 4.7  CL 105 106 105 105 101  CO2 30 34* '30 29 31  '$ GLUCOSE 185* 153* 132* 124* 138*  BUN 52* 53* 47* 43* 41*  CREATININE 0.74 0.69 0.60* 0.64 0.70  CALCIUM 8.0* 8.1* 8.2* 7.9* 8.0*  MG  --   --  2.4  --   --     ABG: No results for input(s): "PHART", "PCO2ART", "PO2ART", "HCO3", "O2SAT" in the last 168 hours.  Liver Function Tests: No results for input(s): "AST", "ALT", "ALKPHOS", "BILITOT", "PROT", "ALBUMIN" in the last 168 hours. No results for input(s): "LIPASE", "AMYLASE" in the last 168 hours. No results for input(s): "AMMONIA" in the last 168 hours.  CBC: Recent Labs  Lab 12/24/21 0057 12/27/21 0138 12/30/21 0209  WBC 2.6* 2.5* 3.3*  HGB 13.0 11.4* 11.5*  HCT 41.3 35.9* 37.2*  MCV 90.8 90.0 90.7  PLT 89* 103* 143*    Cardiac Enzymes: No results for input(s): "CKTOTAL", "CKMB",  "CKMBINDEX", "TROPONINI" in the last 168 hours.  BNP (last 3 results) No results for input(s): "BNP" in the last 8760 hours.  ProBNP (last 3 results) No results for input(s): "PROBNP" in the last 8760 hours.  Radiological Exams: CT CHEST WO CONTRAST  Result Date: 12/28/2021 CLINICAL DATA:  Pleural effusion. EXAM: CT CHEST WITHOUT CONTRAST TECHNIQUE: Multidetector CT imaging of the chest was performed following the standard protocol without IV contrast. RADIATION DOSE REDUCTION: This exam was performed according to the departmental dose-optimization program which includes automated exposure control, adjustment of the mA and/or kV according to patient size and/or use of iterative reconstruction technique. COMPARISON:  July 20, 2021. FINDINGS: Cardiovascular: No evidence of thoracic aortic aneurysm. Normal cardiac size. No pericardial effusion. Mild coronary calcifications are noted. Mediastinum/Nodes: Tracheostomy tube is in grossly good position. Nasogastric tube is seen passing through the esophagus into the stomach. Calcified bilateral hilar and subcarinal lymph nodes are noted most consistent with prior granulomatous disease. Thyroid gland is unremarkable. Lungs/Pleura: Moderate size loculated right pleural effusion is noted which is increased compared to prior exam. Minimal left pleural effusion is noted with adjacent subsegmental atelectasis. No pneumothorax is noted. Increased right lower lobe opacity is noted concerning for atelectasis or possibly pneumonia. Upper Abdomen: Feeding tube is seen entering stomach. Musculoskeletal: No chest wall mass or suspicious bone lesions identified. IMPRESSION: Moderate size loculated right pleural effusion is noted which is increased compared to prior exam. Increased right lower lobe opacity is  noted concerning for atelectasis or possibly pneumonia. Minimal left pleural effusion is noted with associated subsegmental atelectasis. Calcified mediastinal adenopathy  is noted most likely due to prior granulomatous disease. Mild coronary artery calcifications are noted. Electronically Signed   By: Marijo Conception M.D.   On: 12/28/2021 17:42    Assessment/Plan Active Problems:   CKD (chronic kidney disease), stage III (HCC)   COPD, severe (HCC)   Acute on chronic respiratory failure with hypoxia (HCC)   Healthcare-associated pneumonia   Chronic heart failure with preserved ejection fraction (HCC)   Acute on chronic respiratory failure hypoxia- we will continue with T collar wean as tolerated.  Successfully completed 4-hour ATC goal yesterday, currently working on 8-hour ATC goal today. Severe COPD-continue with medical management we will continue to monitor CKD stage III-continue supportive care Healthcare associated pneumonia- has been treated Chronic heart failure preserved ejection fraction -no change we will continue to monitor   I have personally seen and evaluated the patient, evaluated laboratory and imaging results, formulated the assessment and plan and placed orders. The Patient requires high complexity decision making with multiple systems involvement.  Rounds were done with the Respiratory Therapy Director and Staff therapists and discussed with nursing staff also.  Allyne Gee, MD [facility] Pulmonary Critical Care Medicine Sleep Medicine

## 2021-12-31 DIAGNOSIS — J449 Chronic obstructive pulmonary disease, unspecified: Secondary | ICD-10-CM | POA: Diagnosis not present

## 2021-12-31 DIAGNOSIS — I5032 Chronic diastolic (congestive) heart failure: Secondary | ICD-10-CM | POA: Diagnosis not present

## 2021-12-31 DIAGNOSIS — N1831 Chronic kidney disease, stage 3a: Secondary | ICD-10-CM | POA: Diagnosis not present

## 2021-12-31 DIAGNOSIS — J9621 Acute and chronic respiratory failure with hypoxia: Secondary | ICD-10-CM | POA: Diagnosis not present

## 2021-12-31 LAB — CBC
HCT: 35.9 % — ABNORMAL LOW (ref 39.0–52.0)
Hemoglobin: 11.5 g/dL — ABNORMAL LOW (ref 13.0–17.0)
MCH: 28.3 pg (ref 26.0–34.0)
MCHC: 32 g/dL (ref 30.0–36.0)
MCV: 88.4 fL (ref 80.0–100.0)
Platelets: 144 10*3/uL — ABNORMAL LOW (ref 150–400)
RBC: 4.06 MIL/uL — ABNORMAL LOW (ref 4.22–5.81)
RDW: 17.2 % — ABNORMAL HIGH (ref 11.5–15.5)
WBC: 4.4 10*3/uL (ref 4.0–10.5)
nRBC: 0 % (ref 0.0–0.2)

## 2021-12-31 LAB — MAGNESIUM: Magnesium: 2.6 mg/dL — ABNORMAL HIGH (ref 1.7–2.4)

## 2021-12-31 LAB — BASIC METABOLIC PANEL
Anion gap: 8 (ref 5–15)
BUN: 40 mg/dL — ABNORMAL HIGH (ref 8–23)
CO2: 30 mmol/L (ref 22–32)
Calcium: 8.3 mg/dL — ABNORMAL LOW (ref 8.9–10.3)
Chloride: 100 mmol/L (ref 98–111)
Creatinine, Ser: 0.74 mg/dL (ref 0.61–1.24)
GFR, Estimated: >60 mL/min (ref >60)
Glucose, Bld: 100 mg/dL — ABNORMAL HIGH (ref 70–99)
Potassium: 4.8 mmol/L (ref 3.5–5.1)
Sodium: 138 mmol/L (ref 135–145)

## 2022-01-01 DIAGNOSIS — J449 Chronic obstructive pulmonary disease, unspecified: Secondary | ICD-10-CM | POA: Diagnosis not present

## 2022-01-01 DIAGNOSIS — N1831 Chronic kidney disease, stage 3a: Secondary | ICD-10-CM | POA: Diagnosis not present

## 2022-01-01 DIAGNOSIS — J9621 Acute and chronic respiratory failure with hypoxia: Secondary | ICD-10-CM | POA: Diagnosis not present

## 2022-01-01 DIAGNOSIS — I5032 Chronic diastolic (congestive) heart failure: Secondary | ICD-10-CM | POA: Diagnosis not present

## 2022-01-02 DIAGNOSIS — J9621 Acute and chronic respiratory failure with hypoxia: Secondary | ICD-10-CM | POA: Diagnosis not present

## 2022-01-02 DIAGNOSIS — J449 Chronic obstructive pulmonary disease, unspecified: Secondary | ICD-10-CM | POA: Diagnosis not present

## 2022-01-02 DIAGNOSIS — N1831 Chronic kidney disease, stage 3a: Secondary | ICD-10-CM | POA: Diagnosis not present

## 2022-01-02 DIAGNOSIS — I5032 Chronic diastolic (congestive) heart failure: Secondary | ICD-10-CM | POA: Diagnosis not present

## 2022-01-02 NOTE — Progress Notes (Addendum)
Pulmonary Critical Care Medicine [place]   PULMONARY CRITICAL CARE SERVICE  PROGRESS NOTE     Kohl Polinsky  VZC:[id number]  DOB: 02/08/1951   DOA: 12/20/2021  Referring Physician: Satira Sark, MD  HPI: Kethan Papadopoulos is a 71 y.o. male being followed for ventilator/airway/oxygen weaning Acute on Chronic Respiratory Failure.  Patient seen lying in bed, currently on ATC.  Completed 12-hour ATC goal yesterday, currently working 16-hour ATC goal today.  Medications: Reviewed on Rounds  Physical Exam:  Vitals: Temp 98.1, pulse 71, respirations 18, BP 113/53, SPO2 100%  Ventilator Settings ATC 35%  General: Comfortable at this time Neck: supple Cardiovascular: no malignant arrhythmias Respiratory: Bilaterally diminished Skin: no rash seen on limited exam Musculoskeletal: No gross abnormality Psychiatric:unable to assess Neurologic:no involuntary movements         Lab Data:   Basic Metabolic Panel: Recent Labs  Lab 12/27/21 0138 12/28/21 0152 12/30/21 0209 12/31/21 0325  NA 144 141 140 138  K 5.0 4.8 4.7 4.8  CL 105 105 101 100  CO2 '30 29 31 30  '$ GLUCOSE 132* 124* 138* 100*  BUN 47* 43* 41* 40*  CREATININE 0.60* 0.64 0.70 0.74  CALCIUM 8.2* 7.9* 8.0* 8.3*  MG 2.4  --   --  2.6*    ABG: No results for input(s): "PHART", "PCO2ART", "PO2ART", "HCO3", "O2SAT" in the last 168 hours.  Liver Function Tests: No results for input(s): "AST", "ALT", "ALKPHOS", "BILITOT", "PROT", "ALBUMIN" in the last 168 hours. No results for input(s): "LIPASE", "AMYLASE" in the last 168 hours. No results for input(s): "AMMONIA" in the last 168 hours.  CBC: Recent Labs  Lab 12/27/21 0138 12/30/21 0209 12/31/21 0325  WBC 2.5* 3.3* 4.4  HGB 11.4* 11.5* 11.5*  HCT 35.9* 37.2* 35.9*  MCV 90.0 90.7 88.4  PLT 103* 143* 144*    Cardiac Enzymes: No results for input(s): "CKTOTAL", "CKMB", "CKMBINDEX", "TROPONINI" in the last 168 hours.  BNP (last 3  results) No results for input(s): "BNP" in the last 8760 hours.  ProBNP (last 3 results) No results for input(s): "PROBNP" in the last 8760 hours.  Radiological Exams: No results found.  Assessment/Plan Active Problems:   CKD (chronic kidney disease), stage III (HCC)   COPD, severe (HCC)   Acute on chronic respiratory failure with hypoxia (HCC)   Healthcare-associated pneumonia   Chronic heart failure with preserved ejection fraction (HCC)  Acute on chronic respiratory failure hypoxia- we will continue with T collar wean as tolerated.  Successfully completed 12-hour ATC goal yesterday, currently working on 16-hour ATC goal today. Severe COPD-continue with medical management we will continue to monitor CKD stage III-continue supportive care Healthcare associated pneumonia- has been treated Chronic heart failure preserved ejection fraction -no change we will continue to monitor   I have personally seen and evaluated the patient, evaluated laboratory and imaging results, formulated the assessment and plan and placed orders. The Patient requires high complexity decision making with multiple systems involvement.  Rounds were done with the Respiratory Therapy Director and Staff therapists and discussed with nursing staff also.  Allyne Gee, MD [facility] Pulmonary Critical Care Medicine Sleep Medicine

## 2022-01-02 NOTE — Progress Notes (Addendum)
Pulmonary Critical Care Medicine [place]   PULMONARY CRITICAL CARE SERVICE  PROGRESS NOTE     Kenneth Fischer  [id number]  DOB: 19-Jan-1951   DOA: 12/20/2021  Referring Physician: Satira Sark, MD  HPI: Kenneth Fischer is a 71 y.o. male being followed for ventilator/airway/oxygen weaning Acute on Chronic Respiratory Failure.  Patient seen lying in bed, unfortunately was not able to complete 12-hour ATC goal today, will reattempt 12-hour ATC goal.  Tracheal sutures remain in place it is now been 11 days we will go ahead and place an order for those sutures to be removed.  Medications: Reviewed on Rounds  Physical Exam:  Vitals: Temp 97.1, pulse 64, respirations 16, BP 119/71, SPO2 100%  Ventilator Settings ATC 40%  General: Comfortable at this time Neck: supple Cardiovascular: no malignant arrhythmias Respiratory: Bilaterally diminished Skin: no rash seen on limited exam Musculoskeletal: No gross abnormality Psychiatric:unable to assess Neurologic:no involuntary movements         Lab Data:   Basic Metabolic Panel: Recent Labs  Lab 12/27/21 0138 12/28/21 0152 12/30/21 0209 12/31/21 0325  NA 144 141 140 138  K 5.0 4.8 4.7 4.8  CL 105 105 101 100  CO2 '30 29 31 30  '$ GLUCOSE 132* 124* 138* 100*  BUN 47* 43* 41* 40*  CREATININE 0.60* 0.64 0.70 0.74  CALCIUM 8.2* 7.9* 8.0* 8.3*  MG 2.4  --   --  2.6*    ABG: No results for input(s): "PHART", "PCO2ART", "PO2ART", "HCO3", "O2SAT" in the last 168 hours.  Liver Function Tests: No results for input(s): "AST", "ALT", "ALKPHOS", "BILITOT", "PROT", "ALBUMIN" in the last 168 hours. No results for input(s): "LIPASE", "AMYLASE" in the last 168 hours. No results for input(s): "AMMONIA" in the last 168 hours.  CBC: Recent Labs  Lab 12/27/21 0138 12/30/21 0209 12/31/21 0325  WBC 2.5* 3.3* 4.4  HGB 11.4* 11.5* 11.5*  HCT 35.9* 37.2* 35.9*  MCV 90.0 90.7 88.4  PLT 103* 143* 144*    Cardiac  Enzymes: No results for input(s): "CKTOTAL", "CKMB", "CKMBINDEX", "TROPONINI" in the last 168 hours.  BNP (last 3 results) No results for input(s): "BNP" in the last 8760 hours.  ProBNP (last 3 results) No results for input(s): "PROBNP" in the last 8760 hours.  Radiological Exams: No results found.  Assessment/Plan Active Problems:   CKD (chronic kidney disease), stage III (HCC)   COPD, severe (HCC)   Acute on chronic respiratory failure with hypoxia (HCC)   Healthcare-associated pneumonia   Chronic heart failure with preserved ejection fraction (HCC)  Acute on chronic respiratory failure hypoxia- we will continue with T collar wean as tolerated.  Patient reattempting 12-hour ATC goal today. Severe COPD-continue with medical management we will continue to monitor CKD stage III-continue supportive care Healthcare associated pneumonia- has been treated Chronic heart failure preserved ejection fraction -no change we will continue to monitor   I have personally seen and evaluated the patient, evaluated laboratory and imaging results, formulated the assessment and plan and placed orders. The Patient requires high complexity decision making with multiple systems involvement.  Rounds were done with the Respiratory Therapy Director and Staff therapists and discussed with nursing staff also.  Allyne Gee, MD [facility] Pulmonary Critical Care Medicine Sleep Medicine

## 2022-01-02 NOTE — Progress Notes (Addendum)
Pulmonary Critical Care Medicine [place]   PULMONARY CRITICAL CARE SERVICE  PROGRESS NOTE     Rondall Radigan  [id number]  DOB: 1950-09-27   DOA: 12/20/2021  Referring Physician: Satira Sark, MD  HPI: Musa Rewerts is a 71 y.o. male being followed for ventilator/airway/oxygen weaning Acute on Chronic Respiratory Failure.  Patient seen lying in bed, currently on ATC 40%.  Was able to complete 9 out of 12-hour ATC goal yesterday.  We will work on third attempt of 12-hour ATC goal today.  Medications: Reviewed on Rounds  Physical Exam:  Vitals: Temp 96.8, pulse 70, respirations 21, BP 110/69, SPO2 100%  Ventilator Settings ATC 40%  General: Comfortable at this time Neck: supple Cardiovascular: no malignant arrhythmias Respiratory: Bilaterally diminished Skin: no rash seen on limited exam Musculoskeletal: No gross abnormality Psychiatric:unable to assess Neurologic:no involuntary movements         Lab Data:   Basic Metabolic Panel: Recent Labs  Lab 12/27/21 0138 12/28/21 0152 12/30/21 0209 12/31/21 0325  NA 144 141 140 138  K 5.0 4.8 4.7 4.8  CL 105 105 101 100  CO2 '30 29 31 30  '$ GLUCOSE 132* 124* 138* 100*  BUN 47* 43* 41* 40*  CREATININE 0.60* 0.64 0.70 0.74  CALCIUM 8.2* 7.9* 8.0* 8.3*  MG 2.4  --   --  2.6*    ABG: No results for input(s): "PHART", "PCO2ART", "PO2ART", "HCO3", "O2SAT" in the last 168 hours.  Liver Function Tests: No results for input(s): "AST", "ALT", "ALKPHOS", "BILITOT", "PROT", "ALBUMIN" in the last 168 hours. No results for input(s): "LIPASE", "AMYLASE" in the last 168 hours. No results for input(s): "AMMONIA" in the last 168 hours.  CBC: Recent Labs  Lab 12/27/21 0138 12/30/21 0209 12/31/21 0325  WBC 2.5* 3.3* 4.4  HGB 11.4* 11.5* 11.5*  HCT 35.9* 37.2* 35.9*  MCV 90.0 90.7 88.4  PLT 103* 143* 144*    Cardiac Enzymes: No results for input(s): "CKTOTAL", "CKMB", "CKMBINDEX", "TROPONINI" in the  last 168 hours.  BNP (last 3 results) No results for input(s): "BNP" in the last 8760 hours.  ProBNP (last 3 results) No results for input(s): "PROBNP" in the last 8760 hours.  Radiological Exams: No results found.  Assessment/Plan Active Problems:   CKD (chronic kidney disease), stage III (HCC)   COPD, severe (HCC)   Acute on chronic respiratory failure with hypoxia (HCC)   Healthcare-associated pneumonia   Chronic heart failure with preserved ejection fraction (HCC)   Acute on chronic respiratory failure hypoxia- we will continue with T collar wean as tolerated.  Patient will reattempt for the third time 12-hour ATC goal.  Was able to complete 9 out of 12 hours yesterday.   Severe COPD-continue with medical management we will continue to monitor CKD stage III-continue supportive care Healthcare associated pneumonia- has been treated Chronic heart failure preserved ejection fraction -no change we will continue to monitor   I have personally seen and evaluated the patient, evaluated laboratory and imaging results, formulated the assessment and plan and placed orders. The Patient requires high complexity decision making with multiple systems involvement.  Rounds were done with the Respiratory Therapy Director and Staff therapists and discussed with nursing staff also.  Allyne Gee, MD [facility] Pulmonary Critical Care Medicine Sleep Medicine

## 2022-01-03 ENCOUNTER — Other Ambulatory Visit: Payer: Self-pay

## 2022-01-03 DIAGNOSIS — N1831 Chronic kidney disease, stage 3a: Secondary | ICD-10-CM | POA: Diagnosis not present

## 2022-01-03 DIAGNOSIS — J9621 Acute and chronic respiratory failure with hypoxia: Secondary | ICD-10-CM | POA: Diagnosis not present

## 2022-01-03 DIAGNOSIS — I5032 Chronic diastolic (congestive) heart failure: Secondary | ICD-10-CM | POA: Diagnosis not present

## 2022-01-03 DIAGNOSIS — J449 Chronic obstructive pulmonary disease, unspecified: Secondary | ICD-10-CM | POA: Diagnosis not present

## 2022-01-03 LAB — BASIC METABOLIC PANEL
Anion gap: 7 (ref 5–15)
BUN: 38 mg/dL — ABNORMAL HIGH (ref 8–23)
CO2: 31 mmol/L (ref 22–32)
Calcium: 8.2 mg/dL — ABNORMAL LOW (ref 8.9–10.3)
Chloride: 96 mmol/L — ABNORMAL LOW (ref 98–111)
Creatinine, Ser: 0.65 mg/dL (ref 0.61–1.24)
GFR, Estimated: >60 mL/min (ref >60)
Glucose, Bld: 85 mg/dL (ref 70–99)
Potassium: 5 mmol/L (ref 3.5–5.1)
Sodium: 134 mmol/L — ABNORMAL LOW (ref 135–145)

## 2022-01-03 LAB — CYTOLOGY - NON PAP

## 2022-01-03 LAB — MAGNESIUM: Magnesium: 2.3 mg/dL (ref 1.7–2.4)

## 2022-01-03 LAB — CBC
HCT: 36.8 % — ABNORMAL LOW (ref 39.0–52.0)
Hemoglobin: 11.8 g/dL — ABNORMAL LOW (ref 13.0–17.0)
MCH: 28.7 pg (ref 26.0–34.0)
MCHC: 32.1 g/dL (ref 30.0–36.0)
MCV: 89.5 fL (ref 80.0–100.0)
Platelets: 170 10*3/uL (ref 150–400)
RBC: 4.11 MIL/uL — ABNORMAL LOW (ref 4.22–5.81)
RDW: 17.2 % — ABNORMAL HIGH (ref 11.5–15.5)
WBC: 7 10*3/uL (ref 4.0–10.5)
nRBC: 0 % (ref 0.0–0.2)

## 2022-01-03 NOTE — Progress Notes (Addendum)
Pulmonary Critical Care Medicine [place]   PULMONARY CRITICAL CARE SERVICE  PROGRESS NOTE     Johnathen Testa  KWI:[id number]  DOB: [birth year]   DOA: 12/20/2021  Referring Physician: Satira Sark, MD  HPI: Roylee Chaffin is a 71 y.o. male being followed for ventilator/airway/oxygen weaning Acute on Chronic Respiratory Failure.  Patient seen lying in bed, currently on ATC 35%.  Successfully completed 16-hour ATC goal yesterday.  Currently work on 20-hour ATC goal today.  Medications: Reviewed on Rounds  Physical Exam:  Vitals: Temp 97.3, pulse 65, respirations 16, BP 158/77, SPO2 99%  Ventilator Settings ATC 35%  General: Comfortable at this time Neck: supple Cardiovascular: no malignant arrhythmias Respiratory: Bilaterally diminished Skin: no rash seen on limited exam Musculoskeletal: No gross abnormality Psychiatric:unable to assess Neurologic:no involuntary movements         Lab Data:   Basic Metabolic Panel: Recent Labs  Lab 12/28/21 0152 12/30/21 0209 12/31/21 0325 01/03/22 0123  NA 141 140 138 134*  K 4.8 4.7 4.8 5.0  CL 105 101 100 96*  CO2 '29 31 30 31  '$ GLUCOSE 124* 138* 100* 85  BUN 43* 41* 40* 38*  CREATININE 0.64 0.70 0.74 0.65  CALCIUM 7.9* 8.0* 8.3* 8.2*  MG  --   --  2.6* 2.3    ABG: No results for input(s): "PHART", "PCO2ART", "PO2ART", "HCO3", "O2SAT" in the last 168 hours.  Liver Function Tests: No results for input(s): "AST", "ALT", "ALKPHOS", "BILITOT", "PROT", "ALBUMIN" in the last 168 hours. No results for input(s): "LIPASE", "AMYLASE" in the last 168 hours. No results for input(s): "AMMONIA" in the last 168 hours.  CBC: Recent Labs  Lab 12/30/21 0209 12/31/21 0325 01/03/22 0123  WBC 3.3* 4.4 7.0  HGB 11.5* 11.5* 11.8*  HCT 37.2* 35.9* 36.8*  MCV 90.7 88.4 89.5  PLT 143* 144* 170    Cardiac Enzymes: No results for input(s): "CKTOTAL", "CKMB", "CKMBINDEX", "TROPONINI" in the last 168 hours.  BNP  (last 3 results) No results for input(s): "BNP" in the last 8760 hours.  ProBNP (last 3 results) No results for input(s): "PROBNP" in the last 8760 hours.  Radiological Exams: No results found.  Assessment/Plan Active Problems:   CKD (chronic kidney disease), stage III (HCC)   COPD, severe (HCC)   Acute on chronic respiratory failure with hypoxia (HCC)   Healthcare-associated pneumonia   Chronic heart failure with preserved ejection fraction (HCC)  Acute on chronic respiratory failure hypoxia- we will continue with T collar wean as tolerated.  Successfully completed 16-hour ATC goal yesterday, currently working on 20-hour ATC goal today. Severe COPD-continue with medical management we will continue to monitor CKD stage III-continue supportive care Healthcare associated pneumonia- has been treated Chronic heart failure preserved ejection fraction -no change we will continue to monitor   I have personally seen and evaluated the patient, evaluated laboratory and imaging results, formulated the assessment and plan and placed orders. The Patient requires high complexity decision making with multiple systems involvement.  Rounds were done with the Respiratory Therapy Director and Staff therapists and discussed with nursing staff also.  Allyne Gee, MD [facility] Pulmonary Critical Care Medicine Sleep Medicine

## 2022-01-04 DIAGNOSIS — I5032 Chronic diastolic (congestive) heart failure: Secondary | ICD-10-CM | POA: Diagnosis not present

## 2022-01-04 DIAGNOSIS — J9621 Acute and chronic respiratory failure with hypoxia: Secondary | ICD-10-CM | POA: Diagnosis not present

## 2022-01-04 DIAGNOSIS — J449 Chronic obstructive pulmonary disease, unspecified: Secondary | ICD-10-CM | POA: Diagnosis not present

## 2022-01-04 DIAGNOSIS — N1831 Chronic kidney disease, stage 3a: Secondary | ICD-10-CM | POA: Diagnosis not present

## 2022-01-04 LAB — CULTURE, BODY FLUID W GRAM STAIN -BOTTLE: Culture: NO GROWTH

## 2022-01-05 ENCOUNTER — Other Ambulatory Visit (HOSPITAL_COMMUNITY): Payer: Medicare Other

## 2022-01-05 DIAGNOSIS — J449 Chronic obstructive pulmonary disease, unspecified: Secondary | ICD-10-CM | POA: Diagnosis not present

## 2022-01-05 DIAGNOSIS — J9621 Acute and chronic respiratory failure with hypoxia: Secondary | ICD-10-CM | POA: Diagnosis not present

## 2022-01-05 DIAGNOSIS — N1831 Chronic kidney disease, stage 3a: Secondary | ICD-10-CM | POA: Diagnosis not present

## 2022-01-05 DIAGNOSIS — I5032 Chronic diastolic (congestive) heart failure: Secondary | ICD-10-CM | POA: Diagnosis not present

## 2022-01-05 LAB — CBC
HCT: 34.4 % — ABNORMAL LOW (ref 39.0–52.0)
Hemoglobin: 10.8 g/dL — ABNORMAL LOW (ref 13.0–17.0)
MCH: 28.5 pg (ref 26.0–34.0)
MCHC: 31.4 g/dL (ref 30.0–36.0)
MCV: 90.8 fL (ref 80.0–100.0)
Platelets: 172 10*3/uL (ref 150–400)
RBC: 3.79 MIL/uL — ABNORMAL LOW (ref 4.22–5.81)
RDW: 17.3 % — ABNORMAL HIGH (ref 11.5–15.5)
WBC: 4.5 10*3/uL (ref 4.0–10.5)
nRBC: 0 % (ref 0.0–0.2)

## 2022-01-05 LAB — BASIC METABOLIC PANEL
Anion gap: 9 (ref 5–15)
BUN: 39 mg/dL — ABNORMAL HIGH (ref 8–23)
CO2: 30 mmol/L (ref 22–32)
Calcium: 8.1 mg/dL — ABNORMAL LOW (ref 8.9–10.3)
Chloride: 95 mmol/L — ABNORMAL LOW (ref 98–111)
Creatinine, Ser: 0.66 mg/dL (ref 0.61–1.24)
GFR, Estimated: >60 mL/min (ref >60)
Glucose, Bld: 99 mg/dL (ref 70–99)
Potassium: 4.8 mmol/L (ref 3.5–5.1)
Sodium: 134 mmol/L — ABNORMAL LOW (ref 135–145)

## 2022-01-05 NOTE — Progress Notes (Addendum)
Pulmonary Critical Care Medicine [place]   PULMONARY CRITICAL CARE SERVICE  PROGRESS NOTE     Kwame Ryland  [id number]:[id number]  DOB: 1950/02/18   DOA: 12/20/2021  Referring Physician: Satira Sark, MD  HPI: Kyler Germer is a 71 y.o. male being followed for ventilator/airway/oxygen weaning Acute on Chronic Respiratory Failure.  Patient seen lying in bed, currently on third attempt of 20-hour ATC goal.  Was able to complete 7 out of 20 hours of ATC yesterday.  Medications: Reviewed on Rounds  Physical Exam:  Vitals: Temp 97.1, pulse 69, respirations 21, BP 119/68, SpO2 99%  Ventilator Settings ATC 40%  General: Comfortable at this time Neck: supple Cardiovascular: no malignant arrhythmias Respiratory: Bilaterally diminished Skin: no rash seen on limited exam Musculoskeletal: No gross abnormality Psychiatric:unable to assess Neurologic:no involuntary movements         Lab Data:   Basic Metabolic Panel: Recent Labs  Lab 12/30/21 0209 12/31/21 0325 01/03/22 0123 01/05/22 0100  NA 140 138 134* 134*  K 4.7 4.8 5.0 4.8  CL 101 100 96* 95*  CO2 '31 30 31 30  '$ GLUCOSE 138* 100* 85 99  BUN 41* 40* 38* 39*  CREATININE 0.70 0.74 0.65 0.66  CALCIUM 8.0* 8.3* 8.2* 8.1*  MG  --  2.6* 2.3  --     ABG: No results for input(s): "PHART", "PCO2ART", "PO2ART", "HCO3", "O2SAT" in the last 168 hours.  Liver Function Tests: No results for input(s): "AST", "ALT", "ALKPHOS", "BILITOT", "PROT", "ALBUMIN" in the last 168 hours. No results for input(s): "LIPASE", "AMYLASE" in the last 168 hours. No results for input(s): "AMMONIA" in the last 168 hours.  CBC: Recent Labs  Lab 12/30/21 0209 12/31/21 0325 01/03/22 0123 01/05/22 0100  WBC 3.3* 4.4 7.0 4.5  HGB 11.5* 11.5* 11.8* 10.8*  HCT 37.2* 35.9* 36.8* 34.4*  MCV 90.7 88.4 89.5 90.8  PLT 143* 144* 170 172    Cardiac Enzymes: No results for input(s): "CKTOTAL", "CKMB", "CKMBINDEX", "TROPONINI" in  the last 168 hours.  BNP (last 3 results) No results for input(s): "BNP" in the last 8760 hours.  ProBNP (last 3 results) No results for input(s): "PROBNP" in the last 8760 hours.  Radiological Exams: No results found.  Assessment/Plan Active Problems:   CKD (chronic kidney disease), stage III (HCC)   COPD, severe (HCC)   Acute on chronic respiratory failure with hypoxia (HCC)   Healthcare-associated pneumonia   Chronic heart failure with preserved ejection fraction (HCC)  Acute on chronic respiratory failure hypoxia-was able to complete 7 of 20-hour ATC goal yesterday.  Will reattempt 20-hour ATC goal today.  Continue with supportive care. Severe COPD-continue with medical management we will continue to monitor CKD stage III-continue supportive care Healthcare associated pneumonia- has been treated Chronic heart failure preserved ejection fraction -no change we will continue to monitor     I have personally seen and evaluated the patient, evaluated laboratory and imaging results, formulated the assessment and plan and placed orders. The Patient requires high complexity decision making with multiple systems involvement.  Rounds were done with the Respiratory Therapy Director and Staff therapists and discussed with nursing staff also.  Allyne Gee, MD [facility] Pulmonary Critical Care Medicine Sleep Medicine

## 2022-01-05 NOTE — Progress Notes (Addendum)
Pulmonary Critical Care Medicine [place]   PULMONARY CRITICAL CARE SERVICE  PROGRESS NOTE     Kenneth Fischer  [id number]  DOB: [birth year]   DOA: 12/20/2021  Referring Physician: Satira Sark, MD  HPI: Kenneth Fischer is a 71 y.o. male being followed for ventilator/airway/oxygen weaning Acute on Chronic Respiratory Failure.  Patient seen lying in bed, currently reattempting a 20-hour ATC goal today.  Was able to complete only 11 hours of 20 yesterday.  Medications: Reviewed on Rounds  Physical Exam:  Vitals: Temp 97.8, pulse 75, respirations 23, BP 100/56, SpO2 97%  Ventilator Settings ATC 35%  General: Comfortable at this time Neck: supple Cardiovascular: no malignant arrhythmias Respiratory: Bilaterally diminished Skin: no rash seen on limited exam Musculoskeletal: No gross abnormality Psychiatric:unable to assess Neurologic:no involuntary movements         Lab Data:   Basic Metabolic Panel: Recent Labs  Lab 12/30/21 0209 12/31/21 0325 01/03/22 0123 01/05/22 0100  NA 140 138 134* 134*  K 4.7 4.8 5.0 4.8  CL 101 100 96* 95*  CO2 '31 30 31 30  '$ GLUCOSE 138* 100* 85 99  BUN 41* 40* 38* 39*  CREATININE 0.70 0.74 0.65 0.66  CALCIUM 8.0* 8.3* 8.2* 8.1*  MG  --  2.6* 2.3  --     ABG: No results for input(s): "PHART", "PCO2ART", "PO2ART", "HCO3", "O2SAT" in the last 168 hours.  Liver Function Tests: No results for input(s): "AST", "ALT", "ALKPHOS", "BILITOT", "PROT", "ALBUMIN" in the last 168 hours. No results for input(s): "LIPASE", "AMYLASE" in the last 168 hours. No results for input(s): "AMMONIA" in the last 168 hours.  CBC: Recent Labs  Lab 12/30/21 0209 12/31/21 0325 01/03/22 0123 01/05/22 0100  WBC 3.3* 4.4 7.0 4.5  HGB 11.5* 11.5* 11.8* 10.8*  HCT 37.2* 35.9* 36.8* 34.4*  MCV 90.7 88.4 89.5 90.8  PLT 143* 144* 170 172    Cardiac Enzymes: No results for input(s): "CKTOTAL", "CKMB", "CKMBINDEX", "TROPONINI" in the  last 168 hours.  BNP (last 3 results) No results for input(s): "BNP" in the last 8760 hours.  ProBNP (last 3 results) No results for input(s): "PROBNP" in the last 8760 hours.  Radiological Exams: No results found.  Assessment/Plan Active Problems:   CKD (chronic kidney disease), stage III (HCC)   COPD, severe (HCC)   Acute on chronic respiratory failure with hypoxia (HCC)   Healthcare-associated pneumonia   Chronic heart failure with preserved ejection fraction (HCC)   Acute on chronic respiratory failure hypoxia-was able to complete 11 of 20-hour ATC goal yesterday.  Will reattempt 20-hour ATC goal today.  Continue with supportive care. Severe COPD-continue with medical management we will continue to monitor CKD stage III-continue supportive care Healthcare associated pneumonia- has been treated Chronic heart failure preserved ejection fraction -no change we will continue to monitor   I have personally seen and evaluated the patient, evaluated laboratory and imaging results, formulated the assessment and plan and placed orders. The Patient requires high complexity decision making with multiple systems involvement.  Rounds were done with the Respiratory Therapy Director and Staff therapists and discussed with nursing staff also.  Allyne Gee, MD [facility] Pulmonary Critical Care Medicine Sleep Medicine

## 2022-01-06 DIAGNOSIS — J449 Chronic obstructive pulmonary disease, unspecified: Secondary | ICD-10-CM | POA: Diagnosis not present

## 2022-01-06 DIAGNOSIS — I5032 Chronic diastolic (congestive) heart failure: Secondary | ICD-10-CM | POA: Diagnosis not present

## 2022-01-06 DIAGNOSIS — J9621 Acute and chronic respiratory failure with hypoxia: Secondary | ICD-10-CM | POA: Diagnosis not present

## 2022-01-06 DIAGNOSIS — N1831 Chronic kidney disease, stage 3a: Secondary | ICD-10-CM | POA: Diagnosis not present

## 2022-01-06 LAB — BASIC METABOLIC PANEL
Anion gap: 9 (ref 5–15)
BUN: 36 mg/dL — ABNORMAL HIGH (ref 8–23)
CO2: 32 mmol/L (ref 22–32)
Calcium: 8.5 mg/dL — ABNORMAL LOW (ref 8.9–10.3)
Chloride: 96 mmol/L — ABNORMAL LOW (ref 98–111)
Creatinine, Ser: 0.65 mg/dL (ref 0.61–1.24)
GFR, Estimated: >60 mL/min (ref >60)
Glucose, Bld: 102 mg/dL — ABNORMAL HIGH (ref 70–99)
Potassium: 5 mmol/L (ref 3.5–5.1)
Sodium: 137 mmol/L (ref 135–145)

## 2022-01-06 NOTE — Progress Notes (Signed)
Pulmonary Critical Care Medicine [place]   PULMONARY CRITICAL CARE SERVICE  PROGRESS NOTE     Kenneth Fischer  [id number]:[id number]  DOB: 1950/07/08   DOA: 12/20/2021  Referring Physician: Satira Sark, MD  HPI: Kenneth Fischer is a 71 y.o. male being followed for ventilator/airway/oxygen weaning Acute on Chronic Respiratory Failure.  Patient currently is on T collar completing 24 hours  Medications: Reviewed on Rounds  Physical Exam:  Vitals: Temperature is 95.1 pulse 60 respiratory 16 blood pressure 131/69 saturations 100%  Ventilator Settings on T collar  General: Comfortable at this time Neck: supple Cardiovascular: no malignant arrhythmias Respiratory: Scattered rhonchi expansion equal Skin: no rash seen on limited exam Musculoskeletal: No gross abnormality Psychiatric:unable to assess Neurologic:no involuntary movements         Lab Data:   Basic Metabolic Panel: Recent Labs  Lab 12/31/21 0325 01/03/22 0123 01/05/22 0100 01/06/22 0110  NA 138 134* 134* 137  K 4.8 5.0 4.8 5.0  CL 100 96* 95* 96*  CO2 '30 31 30 '$ 32  GLUCOSE 100* 85 99 102*  BUN 40* 38* 39* 36*  CREATININE 0.74 0.65 0.66 0.65  CALCIUM 8.3* 8.2* 8.1* 8.5*  MG 2.6* 2.3  --   --     ABG: No results for input(s): "PHART", "PCO2ART", "PO2ART", "HCO3", "O2SAT" in the last 168 hours.  Liver Function Tests: No results for input(s): "AST", "ALT", "ALKPHOS", "BILITOT", "PROT", "ALBUMIN" in the last 168 hours. No results for input(s): "LIPASE", "AMYLASE" in the last 168 hours. No results for input(s): "AMMONIA" in the last 168 hours.  CBC: Recent Labs  Lab 12/31/21 0325 01/03/22 0123 01/05/22 0100  WBC 4.4 7.0 4.5  HGB 11.5* 11.8* 10.8*  HCT 35.9* 36.8* 34.4*  MCV 88.4 89.5 90.8  PLT 144* 170 172    Cardiac Enzymes: No results for input(s): "CKTOTAL", "CKMB", "CKMBINDEX", "TROPONINI" in the last 168 hours.  BNP (last 3 results) No results for input(s): "BNP" in the  last 8760 hours.  ProBNP (last 3 results) No results for input(s): "PROBNP" in the last 8760 hours.  Radiological Exams: DG Chest Port 1 View  Result Date: 01/05/2022 CLINICAL DATA:  Pleural effusion. EXAM: PORTABLE CHEST 1 VIEW COMPARISON:  Chest x-ray 12/20/2021 FINDINGS: The tracheostomy tube and feeding tube are stable. The cardiac silhouette, mediastinal and hilar contours are stable. Persistent and slightly larger right pleural effusion and worsening right lower lobe lung aeration possibly infiltrate or atelectasis. Stable underlying significant pulmonary scarring changes. IMPRESSION: 1. Stable support apparatus. 2. Persistent and slightly larger right pleural effusion and worsening right lower lobe lung aeration. Electronically Signed   By: Marijo Sanes M.D.   On: 01/05/2022 13:38    Assessment/Plan Active Problems:   CKD (chronic kidney disease), stage III (HCC)   COPD, severe (HCC)   Acute on chronic respiratory failure with hypoxia (HCC)   Healthcare-associated pneumonia   Chronic heart failure with preserved ejection fraction (HCC)   Acute on chronic respiratory failure hypoxia plan is going to be to continue with the T collar weaning as tolerated Severe COPD medical management Chronic kidney disease stage III supportive care Healthcare associated pneumonia has been treated Chronic heart failure preserved ejection fraction no change we will continue to monitor   I have personally seen and evaluated the patient, evaluated laboratory and imaging results, formulated the assessment and plan and placed orders. The Patient requires high complexity decision making with multiple systems involvement.  Rounds were done with the Respiratory Therapy Director  and Staff therapists and discussed with nursing staff also.  Allyne Gee, MD [facility] Pulmonary Critical Care Medicine Sleep Medicine

## 2022-01-07 ENCOUNTER — Encounter (HOSPITAL_BASED_OUTPATIENT_CLINIC_OR_DEPARTMENT_OTHER): Payer: Medicare Other

## 2022-01-07 DIAGNOSIS — M7989 Other specified soft tissue disorders: Secondary | ICD-10-CM

## 2022-01-07 DIAGNOSIS — J449 Chronic obstructive pulmonary disease, unspecified: Secondary | ICD-10-CM | POA: Diagnosis not present

## 2022-01-07 DIAGNOSIS — I5032 Chronic diastolic (congestive) heart failure: Secondary | ICD-10-CM | POA: Diagnosis not present

## 2022-01-07 DIAGNOSIS — N1831 Chronic kidney disease, stage 3a: Secondary | ICD-10-CM | POA: Diagnosis not present

## 2022-01-07 DIAGNOSIS — J9621 Acute and chronic respiratory failure with hypoxia: Secondary | ICD-10-CM | POA: Diagnosis not present

## 2022-01-07 DIAGNOSIS — I82611 Acute embolism and thrombosis of superficial veins of right upper extremity: Secondary | ICD-10-CM

## 2022-01-07 NOTE — Progress Notes (Signed)
Pulmonary Critical Care Medicine [place]   PULMONARY CRITICAL CARE SERVICE  PROGRESS NOTE     Medhansh Brinkmeier  QIH:[id number]  DOB: Jun 15, 1950   DOA: 12/20/2021  Referring Physician: Satira Sark, MD  HPI: Kenneth Fischer is a 71 y.o. male being followed for ventilator/airway/oxygen weaning Acute on Chronic Respiratory Failure.  Patient is resting comfortably without distress at this time no fevers are noted  Medications: Reviewed on Rounds  Physical Exam:  Vitals: Temperature is 97.1 pulse 82 respiratory rate is 22 blood pressure 132/81 saturations 99%  Ventilator Settings off the ventilator on T collar  General: Comfortable at this time Neck: supple Cardiovascular: no malignant arrhythmias Respiratory: No rhonchi no rales Skin: no rash seen on limited exam Musculoskeletal: No gross abnormality Psychiatric:unable to assess Neurologic:no involuntary movements         Lab Data:   Basic Metabolic Panel: Recent Labs  Lab 01/03/22 0123 01/05/22 0100 01/06/22 0110  NA 134* 134* 137  K 5.0 4.8 5.0  CL 96* 95* 96*  CO2 31 30 32  GLUCOSE 85 99 102*  BUN 38* 39* 36*  CREATININE 0.65 0.66 0.65  CALCIUM 8.2* 8.1* 8.5*  MG 2.3  --   --     ABG: No results for input(s): "PHART", "PCO2ART", "PO2ART", "HCO3", "O2SAT" in the last 168 hours.  Liver Function Tests: No results for input(s): "AST", "ALT", "ALKPHOS", "BILITOT", "PROT", "ALBUMIN" in the last 168 hours. No results for input(s): "LIPASE", "AMYLASE" in the last 168 hours. No results for input(s): "AMMONIA" in the last 168 hours.  CBC: Recent Labs  Lab 01/03/22 0123 01/05/22 0100  WBC 7.0 4.5  HGB 11.8* 10.8*  HCT 36.8* 34.4*  MCV 89.5 90.8  PLT 170 172    Cardiac Enzymes: No results for input(s): "CKTOTAL", "CKMB", "CKMBINDEX", "TROPONINI" in the last 168 hours.  BNP (last 3 results) No results for input(s): "BNP" in the last 8760 hours.  ProBNP (last 3 results) No results  for input(s): "PROBNP" in the last 8760 hours.  Radiological Exams: DG Chest Port 1 View  Result Date: 01/05/2022 CLINICAL DATA:  Pleural effusion. EXAM: PORTABLE CHEST 1 VIEW COMPARISON:  Chest x-ray 12/20/2021 FINDINGS: The tracheostomy tube and feeding tube are stable. The cardiac silhouette, mediastinal and hilar contours are stable. Persistent and slightly larger right pleural effusion and worsening right lower lobe lung aeration possibly infiltrate or atelectasis. Stable underlying significant pulmonary scarring changes. IMPRESSION: 1. Stable support apparatus. 2. Persistent and slightly larger right pleural effusion and worsening right lower lobe lung aeration. Electronically Signed   By: Marijo Sanes M.D.   On: 01/05/2022 13:38    Assessment/Plan Active Problems:   CKD (chronic kidney disease), stage III (HCC)   COPD, severe (HCC)   Acute on chronic respiratory failure with hypoxia (HCC)   Healthcare-associated pneumonia   Chronic heart failure with preserved ejection fraction (HCC)   Acute on chronic respiratory failure hypoxia plan is going to go for 24 hours on T collar Severe COPD medical management we will continue to follow along Chronic kidney disease stage III supportive care Healthcare associated pneumonia has been treated Chronic heart failure preserved ejection fraction no change   I have personally seen and evaluated the patient, evaluated laboratory and imaging results, formulated the assessment and plan and placed orders. The Patient requires high complexity decision making with multiple systems involvement.  Rounds were done with the Respiratory Therapy Director and Staff therapists and discussed with nursing staff also.  Deann Mclaine  Richardson Dopp, MD [facility] Pulmonary Critical Care Medicine Sleep Medicine

## 2022-01-07 NOTE — Progress Notes (Signed)
Right upper extremity venous duplex has been completed. Preliminary results can be found in CV Proc through chart review.  Results were given to the patient's nurse, Nigel Sloop.   01/07/22 12:20 PM Kenneth Fischer RVT

## 2022-01-08 DIAGNOSIS — I5032 Chronic diastolic (congestive) heart failure: Secondary | ICD-10-CM | POA: Diagnosis not present

## 2022-01-08 DIAGNOSIS — J9621 Acute and chronic respiratory failure with hypoxia: Secondary | ICD-10-CM | POA: Diagnosis not present

## 2022-01-08 DIAGNOSIS — N1831 Chronic kidney disease, stage 3a: Secondary | ICD-10-CM | POA: Diagnosis not present

## 2022-01-08 DIAGNOSIS — J449 Chronic obstructive pulmonary disease, unspecified: Secondary | ICD-10-CM | POA: Diagnosis not present

## 2022-01-08 LAB — CBC
HCT: 33.6 % — ABNORMAL LOW (ref 39.0–52.0)
Hemoglobin: 11.2 g/dL — ABNORMAL LOW (ref 13.0–17.0)
MCH: 29.5 pg (ref 26.0–34.0)
MCHC: 33.3 g/dL (ref 30.0–36.0)
MCV: 88.4 fL (ref 80.0–100.0)
Platelets: 189 10*3/uL (ref 150–400)
RBC: 3.8 MIL/uL — ABNORMAL LOW (ref 4.22–5.81)
RDW: 16.9 % — ABNORMAL HIGH (ref 11.5–15.5)
WBC: 5 10*3/uL (ref 4.0–10.5)
nRBC: 0 % (ref 0.0–0.2)

## 2022-01-08 LAB — BASIC METABOLIC PANEL
Anion gap: 8 (ref 5–15)
BUN: 34 mg/dL — ABNORMAL HIGH (ref 8–23)
CO2: 34 mmol/L — ABNORMAL HIGH (ref 22–32)
Calcium: 8.3 mg/dL — ABNORMAL LOW (ref 8.9–10.3)
Chloride: 93 mmol/L — ABNORMAL LOW (ref 98–111)
Creatinine, Ser: 0.66 mg/dL (ref 0.61–1.24)
GFR, Estimated: >60 mL/min (ref >60)
Glucose, Bld: 100 mg/dL — ABNORMAL HIGH (ref 70–99)
Potassium: 4.8 mmol/L (ref 3.5–5.1)
Sodium: 135 mmol/L (ref 135–145)

## 2022-01-08 LAB — MAGNESIUM: Magnesium: 2.1 mg/dL (ref 1.7–2.4)

## 2022-01-08 NOTE — Progress Notes (Signed)
Pulmonary Critical Care Medicine [place]   PULMONARY CRITICAL CARE SERVICE  PROGRESS NOTE     Elihu Milstein  TDV:[id number]  DOB: 1950-08-13   DOA: 12/20/2021  Referring Physician: Satira Sark, MD  HPI: Malakhai Beitler is a 71 y.o. male being followed for ventilator/airway/oxygen weaning Acute on Chronic Respiratory Failure.  Patient is on the ventilator on full support on assist control mode had increased work of breathing at 24 hours of weaning so was placed back on the vent  Medications: Reviewed on Rounds  Physical Exam:  Vitals: Temperature is 97.8 pulse of 78 respiratory rate 26 blood pressure 130/67 saturations 94%  Ventilator Settings on assist control FiO2 is 35% tidal volume 513 PEEP of 5  General: Comfortable at this time Neck: supple Cardiovascular: no malignant arrhythmias Respiratory: No rhonchi or rales are noted at this time Skin: no rash seen on limited exam Musculoskeletal: No gross abnormality Psychiatric:unable to assess Neurologic:no involuntary movements         Lab Data:   Basic Metabolic Panel: Recent Labs  Lab 01/03/22 0123 01/05/22 0100 01/06/22 0110 01/08/22 0130  NA 134* 134* 137 135  K 5.0 4.8 5.0 4.8  CL 96* 95* 96* 93*  CO2 31 30 32 34*  GLUCOSE 85 99 102* 100*  BUN 38* 39* 36* 34*  CREATININE 0.65 0.66 0.65 0.66  CALCIUM 8.2* 8.1* 8.5* 8.3*  MG 2.3  --   --  2.1    ABG: No results for input(s): "PHART", "PCO2ART", "PO2ART", "HCO3", "O2SAT" in the last 168 hours.  Liver Function Tests: No results for input(s): "AST", "ALT", "ALKPHOS", "BILITOT", "PROT", "ALBUMIN" in the last 168 hours. No results for input(s): "LIPASE", "AMYLASE" in the last 168 hours. No results for input(s): "AMMONIA" in the last 168 hours.  CBC: Recent Labs  Lab 01/03/22 0123 01/05/22 0100 01/08/22 0130  WBC 7.0 4.5 5.0  HGB 11.8* 10.8* 11.2*  HCT 36.8* 34.4* 33.6*  MCV 89.5 90.8 88.4  PLT 170 172 189    Cardiac  Enzymes: No results for input(s): "CKTOTAL", "CKMB", "CKMBINDEX", "TROPONINI" in the last 168 hours.  BNP (last 3 results) No results for input(s): "BNP" in the last 8760 hours.  ProBNP (last 3 results) No results for input(s): "PROBNP" in the last 8760 hours.  Radiological Exams: VAS [place] UPPER EXTREMITY VENOUS DUPLEX  Result Date: 01/07/2022 UPPER VENOUS STUDY  Patient Name:  CAROLYN MANISCALCO  Date of Exam:   01/07/2022 Medical Rec #: [id number]      Accession #:    [id number] Date of Birth: [birth year]       Patient Gender: M Patient Age:   89 years Exam Location:  [facility] Procedure:      VAS [place] UPPER EXTREMITY VENOUS DUPLEX Referring Phys: Satira Sark --------------------------------------------------------------------------------  Indications: Swelling Risk Factors: None identified. Comparison Study: No prior studies. Performing Technologist: Oliver Hum RVT  Examination Guidelines: A complete evaluation includes B-mode imaging, spectral Doppler, color Doppler, and power Doppler as needed of all accessible portions of each vessel. Bilateral testing is considered an integral part of a complete examination. Limited examinations for reoccurring indications may be performed as noted.  Right Findings: +----------+------------+---------+-----------+----------+-------+ RIGHT     CompressiblePhasicitySpontaneousPropertiesSummary +----------+------------+---------+-----------+----------+-------+ IJV           Full       Yes       Yes                      +----------+------------+---------+-----------+----------+-------+  Subclavian    Full       Yes       Yes                      +----------+------------+---------+-----------+----------+-------+ Axillary      None       No        No                Acute  +----------+------------+---------+-----------+----------+-------+ Brachial      Full       No        No                        +----------+------------+---------+-----------+----------+-------+ Radial        Full                                          +----------+------------+---------+-----------+----------+-------+ Ulnar         Full                                          +----------+------------+---------+-----------+----------+-------+ Cephalic    Partial                                  Acute  +----------+------------+---------+-----------+----------+-------+ Basilic       Full                                          +----------+------------+---------+-----------+----------+-------+ Thrombus detected in the cephalic vein is noted to be located in the mid upper arm.  Left Findings: +----------+------------+---------+-----------+----------+-------+ LEFT      CompressiblePhasicitySpontaneousPropertiesSummary +----------+------------+---------+-----------+----------+-------+ Subclavian    Full       Yes       Yes                      +----------+------------+---------+-----------+----------+-------+  *See table(s) above for measurements and observations.  Diagnosing physician: Harold Barban MD Electronically signed by Harold Barban MD on 01/07/2022 at 8:50:18 PM.    Final     Assessment/Plan Active Problems:   CKD (chronic kidney disease), stage III (HCC)   COPD, severe ([place])   Acute on chronic respiratory failure with hypoxia (HCC)   Healthcare-associated pneumonia   Chronic heart failure with preserved ejection fraction (HCC)   Acute on chronic respiratory failure hypoxia we will continue with full support on the ventilator let him rest right now Severe COPD medical management we will continue to monitor. Chronic kidney disease stage III supportive care Healthcare associated pneumonia Chronic heart failure preserved ejection fraction we will continue to monitor fluid status   I have personally seen and evaluated the patient, evaluated laboratory and imaging results,  formulated the assessment and plan and placed orders. The Patient requires high complexity decision making with multiple systems involvement.  Rounds were done with the Respiratory Therapy Director and Staff therapists and discussed with nursing staff also.  Allyne Gee, MD [facility] Pulmonary Critical Care Medicine Sleep Medicine

## 2022-01-09 DIAGNOSIS — J449 Chronic obstructive pulmonary disease, unspecified: Secondary | ICD-10-CM | POA: Diagnosis not present

## 2022-01-09 DIAGNOSIS — J9621 Acute and chronic respiratory failure with hypoxia: Secondary | ICD-10-CM | POA: Diagnosis not present

## 2022-01-09 DIAGNOSIS — N1831 Chronic kidney disease, stage 3a: Secondary | ICD-10-CM | POA: Diagnosis not present

## 2022-01-09 DIAGNOSIS — I5032 Chronic diastolic (congestive) heart failure: Secondary | ICD-10-CM | POA: Diagnosis not present

## 2022-01-09 LAB — BASIC METABOLIC PANEL
Anion gap: 8 (ref 5–15)
BUN: 37 mg/dL — ABNORMAL HIGH (ref 8–23)
CO2: 32 mmol/L (ref 22–32)
Calcium: 8.3 mg/dL — ABNORMAL LOW (ref 8.9–10.3)
Chloride: 93 mmol/L — ABNORMAL LOW (ref 98–111)
Creatinine, Ser: 0.67 mg/dL (ref 0.61–1.24)
GFR, Estimated: >60 mL/min (ref >60)
Glucose, Bld: 106 mg/dL — ABNORMAL HIGH (ref 70–99)
Potassium: 4.5 mmol/L (ref 3.5–5.1)
Sodium: 133 mmol/L — ABNORMAL LOW (ref 135–145)

## 2022-01-09 MED ORDER — CEFAZOLIN SODIUM-DEXTROSE 2-4 GM/100ML-% IV SOLN: 2.0000 g | INTRAVENOUS | Status: AC

## 2022-01-09 NOTE — Progress Notes (Signed)
Pulmonary Critical Care Medicine [place]   PULMONARY CRITICAL CARE SERVICE  PROGRESS NOTE     Jerelle Virden  KLK:[id number]  DOB: 1950/05/14   DOA: 12/20/2021  Referring Physician: Satira Sark, MD  HPI: Keimari Bartley is a 71 y.o. male being followed for ventilator/airway/oxygen weaning Acute on Chronic Respiratory Failure.  Patient is on 35% FiO2 on the T-bar and goal is for 24 hours  Medications: Reviewed on Rounds  Physical Exam:  Vitals: Temperature 97.8 pulse of 70 respiratory 18 blood pressure is 98/58 saturations 100%  Ventilator Settings T collar FiO2 35%  General: Comfortable at this time Neck: supple Cardiovascular: no malignant arrhythmias Respiratory: Scattered coarse rhonchi are noted Skin: no rash seen on limited exam Musculoskeletal: No gross abnormality Psychiatric:unable to assess Neurologic:no involuntary movements         Lab Data:   Basic Metabolic Panel: Recent Labs  Lab 01/03/22 0123 01/05/22 0100 01/06/22 0110 01/08/22 0130 01/09/22 0201  NA 134* 134* 137 135 133*  K 5.0 4.8 5.0 4.8 4.5  CL 96* 95* 96* 93* 93*  CO2 31 30 32 34* 32  GLUCOSE 85 99 102* 100* 106*  BUN 38* 39* 36* 34* 37*  CREATININE 0.65 0.66 0.65 0.66 0.67  CALCIUM 8.2* 8.1* 8.5* 8.3* 8.3*  MG 2.3  --   --  2.1  --     ABG: No results for input(s): "PHART", "PCO2ART", "PO2ART", "HCO3", "O2SAT" in the last 168 hours.  Liver Function Tests: No results for input(s): "AST", "ALT", "ALKPHOS", "BILITOT", "PROT", "ALBUMIN" in the last 168 hours. No results for input(s): "LIPASE", "AMYLASE" in the last 168 hours. No results for input(s): "AMMONIA" in the last 168 hours.  CBC: Recent Labs  Lab 01/03/22 0123 01/05/22 0100 01/08/22 0130  WBC 7.0 4.5 5.0  HGB 11.8* 10.8* 11.2*  HCT 36.8* 34.4* 33.6*  MCV 89.5 90.8 88.4  PLT 170 172 189    Cardiac Enzymes: No results for input(s): "CKTOTAL", "CKMB", "CKMBINDEX", "TROPONINI" in the last 168  hours.  BNP (last 3 results) No results for input(s): "BNP" in the last 8760 hours.  ProBNP (last 3 results) No results for input(s): "PROBNP" in the last 8760 hours.  Radiological Exams: No results found.  Assessment/Plan Active Problems:   CKD (chronic kidney disease), stage III (HCC)   COPD, severe (HCC)   Acute on chronic respiratory failure with hypoxia (HCC)   Healthcare-associated pneumonia   Chronic heart failure with preserved ejection fraction (HCC)   Acute on chronic respiratory failure with hypoxia we will continue with the T-piece and titrate oxygen as tolerated we will continue with pulmonary toilet Severe COPD medical management inhalers and nebulizers Healthcare associated pneumonia has been treated with antibiotics Chronic kidney disease continue to monitor patient's labs Chronic heart failure compensated we will continue with supportive care   I have personally seen and evaluated the patient, evaluated laboratory and imaging results, formulated the assessment and plan and placed orders. The Patient requires high complexity decision making with multiple systems involvement.  Rounds were done with the Respiratory Therapy Director and Staff therapists and discussed with nursing staff also.  Allyne Gee, MD [facility] Pulmonary Critical Care Medicine Sleep Medicine

## 2022-01-09 NOTE — Consult Note (Signed)
Chief Complaint: Patient was seen in consultation today for gastrostomy tube at the request of [facility]  Referring Physician(s): Dr. Satira Sark  Supervising Physician: Michaelle Birks  Patient Status: [facility]  History of Present Illness: Kenneth Fischer is a 71 y.o. male admitted to [facility] for respiratory failure and vent weaning.  He is currently trach/vent dependent. He has been receiving nutrition via NG tube feedings during his stay.  Due to aspiration risk, continued enteral nutrition other than PO route is needed and [facility] has suggested a G-tube as a longer term solution.  IR consulted for placement.  Past Medical History:  Diagnosis Date   Bronchitis    Cancer ([place])    right renal neoplasm   COPD (chronic obstructive pulmonary disease) (HCC)    DVT (deep vein thrombosis) in pregnancy    History of kidney stones    Hypertension    Pneumonia     Past Surgical History:  Procedure Laterality Date   INTERCOSTAL NERVE BLOCK Left 10/15/2020   Procedure: INTERCOSTAL NERVE BLOCK;  Surgeon: [name], MD;  Location: [place];  Service: Thoracic;  Laterality: Left;   IR THORACENTESIS ASP PLEURAL SPACE W/IMG GUIDE  12/30/2021   NODE DISSECTION Left 10/15/2020   Procedure: NODE DISSECTION;  Surgeon: [name], MD;  Location: [place];  Service: Thoracic;  Laterality: Left;   ROBOTIC ASSITED PARTIAL NEPHRECTOMY Left 02/20/2019   Procedure: XI ROBOTIC ASSITED LAPAROSCOPIC ASSISTED RADICAL NEPHRECTOMY;  Surgeon: Raynelle Bring, MD;  Location: [facility];  Service: Urology;  Laterality: Left;   TONSILLECTOMY      Allergies: Codeine and Morphine and related  Medications: Prior to Admission medications   Medication Sig Start Date End Date Taking? Authorizing Provider  albuterol (VENTOLIN HFA) 108 (90 Base) MCG/ACT inhaler Inhale 2 puffs into the lungs every 6 (six) hours as needed for wheezing or shortness of breath.  02/11/19   [provider]   apixaban (ELIQUIS) 5 MG TABS tablet Take 5 mg by mouth 2 (two) times daily.    [provider]  carvedilol (COREG) 12.5 MG tablet Take 12.5 mg by mouth 2 (two) times daily. 01/02/19   [provider]  furosemide (LASIX) 20 MG tablet Take 20 mg by mouth daily. 01/30/19   [provider]  prochlorperazine (COMPAZINE) 10 MG tablet TAKE 1 TABLET(10 MG) BY MOUTH EVERY 6 HOURS AS NEEDED FOR NAUSEA OR VOMITING 08/05/21   Wyatt Portela, MD  sacubitril-valsartan (ENTRESTO) 49-51 MG Take 1 tablet by mouth 2 (two) times daily. 04/13/17   [provider]  traMADol (ULTRAM) 50 MG tablet Take 1 tablet (50 mg total) by mouth every 6 (six) hours as needed (mild pain). 10/23/20   Barrett, Lodema Hong, PA-C  List per [facility]: Lasix, amlodipine, budesonide, clonazepam, famotidine, albuterol-ipratropium, losartan, lovenox, miralax, prednisone, scopolamine, senna-lax, D5-NaCl, Magnesium sulfate, Potassium Chloride, hydralazine, midazolam, fentanyl, kayexalate, carvedilol, heparin  No family history on file.  Social History   Socioeconomic History   Marital status: Single    Spouse name: Not on file   Number of children: Not on file   Years of education: Not on file   Highest education level: Not on file  Occupational History   Not on file  Tobacco Use   Smoking status: Former    Packs/day: 2.00    Years: 10.00    Total pack years: 20.00    Types: Cigarettes    Quit date: 11/24/1988    Years since quitting: 26.1  Smokeless tobacco: Never  Vaping Use   Vaping Use: Never used  Substance and Sexual Activity   Alcohol use: Not Currently   Drug use: Never   Sexual activity: Not on file  Other Topics Concern   Not on file  Social History Narrative   Not on file   Social Determinants of Health   Financial Resource Strain: Not on file  Food Insecurity: Not on file  Transportation Needs: Not on file  Physical Activity: Not on file  Stress: Not on file  Social Connections:  Not on file    Review of Systems: Unable to obtain  Vital Signs: There were no vitals taken for this visit.  Physical Exam Constitutional:      General: He is not in acute distress.    Appearance: He is ill-appearing.     Comments: Poor communication.  Unable to write.  Unable to speak audibly.  HENT:     Head: Normocephalic and atraumatic.     Mouth/Throat:     Mouth: Mucous membranes are moist.     Comments: Trach in place with copious secretions in trach collar Eyes:     Extraocular Movements: Extraocular movements intact.  Pulmonary:     Effort: Pulmonary effort is normal. No respiratory distress.  Abdominal:     General: Abdomen is flat.     Palpations: Abdomen is soft.  Skin:    General: Skin is dry.     Coloration: Skin is pale.  Neurological:     Mental Status: He is alert.     Imaging: VAS [place] UPPER EXTREMITY VENOUS DUPLEX  Result Date: 01/07/2022 UPPER VENOUS STUDY  Patient Name:  Kenneth Fischer  Date of Exam:   01/07/2022 Medical Rec #: [id number]      Accession #:    [id number] Date of Birth: January 19, 1951       Patient Gender: M Patient Age:   78 years Exam Location:  [facility] Procedure:      VAS [place] UPPER EXTREMITY VENOUS DUPLEX Referring Phys: Satira Sark --------------------------------------------------------------------------------  Indications: Swelling Risk Factors: None identified. Comparison Study: No prior studies. Performing Technologist: Oliver Hum RVT  Examination Guidelines: A complete evaluation includes B-mode imaging, spectral Doppler, color Doppler, and power Doppler as needed of all accessible portions of each vessel. Bilateral testing is considered an integral part of a complete examination. Limited examinations for reoccurring indications may be performed as noted.  Right Findings: +----------+------------+---------+-----------+----------+-------+ RIGHT     CompressiblePhasicitySpontaneousPropertiesSummary  +----------+------------+---------+-----------+----------+-------+ IJV           Full       Yes       Yes                      +----------+------------+---------+-----------+----------+-------+ Subclavian    Full       Yes       Yes                      +----------+------------+---------+-----------+----------+-------+ Axillary      None       No        No                Acute  +----------+------------+---------+-----------+----------+-------+ Brachial      Full       No        No                       +----------+------------+---------+-----------+----------+-------+  Radial        Full                                          +----------+------------+---------+-----------+----------+-------+ Ulnar         Full                                          +----------+------------+---------+-----------+----------+-------+ Cephalic    Partial                                  Acute  +----------+------------+---------+-----------+----------+-------+ Basilic       Full                                          +----------+------------+---------+-----------+----------+-------+ Thrombus detected in the cephalic vein is noted to be located in the mid upper arm.  Left Findings: +----------+------------+---------+-----------+----------+-------+ LEFT      CompressiblePhasicitySpontaneousPropertiesSummary +----------+------------+---------+-----------+----------+-------+ Subclavian    Full       Yes       Yes                      +----------+------------+---------+-----------+----------+-------+  *See table(s) above for measurements and observations.  Diagnosing physician: Harold Barban MD Electronically signed by Harold Barban MD on 01/07/2022 at 8:50:18 PM.    Final    DG Chest Port 1 View  Result Date: 01/05/2022 CLINICAL DATA:  Pleural effusion. EXAM: PORTABLE CHEST 1 VIEW COMPARISON:  Chest x-ray 12/20/2021 FINDINGS: The tracheostomy tube and feeding  tube are stable. The cardiac silhouette, mediastinal and hilar contours are stable. Persistent and slightly larger right pleural effusion and worsening right lower lobe lung aeration possibly infiltrate or atelectasis. Stable underlying significant pulmonary scarring changes. IMPRESSION: 1. Stable support apparatus. 2. Persistent and slightly larger right pleural effusion and worsening right lower lobe lung aeration. Electronically Signed   By: Marijo Sanes M.D.   On: 01/05/2022 13:38   IR THORACENTESIS ASP PLEURAL SPACE W/IMG GUIDE  Result Date: 12/30/2021 INDICATION: Acute on chronic respiratory failure, difficulty weaning from vent, pleural effusion EXAM: ULTRASOUND GUIDED RIGHT THORACENTESIS MEDICATIONS: None. COMPLICATIONS: None immediate. PROCEDURE: An ultrasound guided thoracentesis was thoroughly discussed with the patient and questions answered. The benefits, risks, alternatives and complications were also discussed. The patient understands and wishes to proceed with the procedure. Written consent was obtained. Ultrasound was performed to localize and mark an adequate pocket of fluid in the right chest. The area was then prepped and draped in the normal sterile fashion. 1% Lidocaine was used for local anesthesia. Under ultrasound guidance a 6 Fr Safe-T-Centesis catheter was introduced. Thoracentesis was performed. The catheter was removed and a dressing applied. FINDINGS: A total of approximately 800 cc of yellow pleural fluid was removed. Samples were sent to the laboratory as requested by the clinical team. IMPRESSION: Successful ultrasound guided right thoracentesis yielding 800 cc of pleural fluid. Electronically Signed   By: Jacqulynn Cadet M.D.   On: 12/30/2021 13:28   DG Chest Port 1 View  Result Date: 12/30/2021 CLINICAL DATA:  Status post thoracentesis EXAM: PORTABLE CHEST 1 VIEW COMPARISON:  Chest  x-ray dated December 23, 2021 FINDINGS: Tracheostomy tube and feeding tube. Cardiac and  mediastinal contours are unchanged. Unchanged bilateral irregular linear opacities. Small bilateral pleural effusions with interval decreased size of left pleural effusion. No evidence of pneumothorax. IMPRESSION: No evidence of pneumothorax status post thoracentesis. Electronically Signed   By: Yetta Glassman M.D.   On: 12/30/2021 13:02   CT CHEST WO CONTRAST  Result Date: 12/28/2021 CLINICAL DATA:  Pleural effusion. EXAM: CT CHEST WITHOUT CONTRAST TECHNIQUE: Multidetector CT imaging of the chest was performed following the standard protocol without IV contrast. RADIATION DOSE REDUCTION: This exam was performed according to the departmental dose-optimization program which includes automated exposure control, adjustment of the mA and/or kV according to patient size and/or use of iterative reconstruction technique. COMPARISON:  July 20, 2021. FINDINGS: Cardiovascular: No evidence of thoracic aortic aneurysm. Normal cardiac size. No pericardial effusion. Mild coronary calcifications are noted. Mediastinum/Nodes: Tracheostomy tube is in grossly good position. Nasogastric tube is seen passing through the esophagus into the stomach. Calcified bilateral hilar and subcarinal lymph nodes are noted most consistent with prior granulomatous disease. Thyroid gland is unremarkable. Lungs/Pleura: Moderate size loculated right pleural effusion is noted which is increased compared to prior exam. Minimal left pleural effusion is noted with adjacent subsegmental atelectasis. No pneumothorax is noted. Increased right lower lobe opacity is noted concerning for atelectasis or possibly pneumonia. Upper Abdomen: Feeding tube is seen entering stomach. Musculoskeletal: No chest wall mass or suspicious bone lesions identified. IMPRESSION: Moderate size loculated right pleural effusion is noted which is increased compared to prior exam. Increased right lower lobe opacity is noted concerning for atelectasis or possibly pneumonia. Minimal  left pleural effusion is noted with associated subsegmental atelectasis. Calcified mediastinal adenopathy is noted most likely due to prior granulomatous disease. Mild coronary artery calcifications are noted. Electronically Signed   By: Marijo Conception M.D.   On: 12/28/2021 17:42   DG CHEST PORT 1 VIEW  Result Date: 12/23/2021 CLINICAL DATA:  Pneumonia. EXAM: PORTABLE CHEST 1 VIEW COMPARISON:  12/20/21 FINDINGS: The tracheostomy tube tip is 6.7 cm above the carina. There is a feeding tube with tip below the GE junction. Stable cardiomediastinal contours. Unchanged small bilateral pleural effusions. There is been interval asymmetric left lower lung volume loss and opacification which likely represents a combination of left lower lobe atelectasis and airspace disease. Confluent scarring and architectural distortion within bilateral upper lobe perihilar regions appear unchanged. Associated upward retraction of the hilar structures noted. IMPRESSION: 1. Interval asymmetric left lower lobe volume loss and opacification which likely represents a combination of left lower lobe atelectasis and airspace disease. 2. Stable small bilateral pleural effusions. Electronically Signed   By: Kerby Moors M.D.   On: 12/23/2021 06:15   DG Chest 1 View  Result Date: 12/20/2021 CLINICAL DATA:  Respiratory failure. EXAM: CHEST  1 VIEW COMPARISON:  Chest radiograph dated 12/19/2021. FINDINGS: Tracheostomy with tip approximately 6 cm above the carina. Feeding tube extends below the diaphragm with tip beyond the inferior margin of the image. There is background of emphysema. 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 An area of increased density in the right perihilar region may represent developing infiltrate. Small bilateral pleural effusions and bibasilar atelectasis/scarring. No pneumothorax. Stable cardiac silhouette. No acute osseous pathology. IMPRESSION: 1. Possible  developing infiltrate in the right perihilar region. 2. Small bilateral pleural effusions. 3. Background of sarcoidosis. Electronically Signed   By: Anner Crete M.D.   On:  12/20/2021 20:20   DG CHEST PORT 1 VIEW  Result Date: 12/19/2021 CLINICAL DATA:  CHF EXAM: PORTABLE CHEST 1 VIEW COMPARISON:  Three days ago FINDINGS: Endotracheal tube with tip just below the clavicular heads. The feeding tube reaches the stomach. Chronic lung disease with postoperative changes at the left base. Hyperinflation and trace pleural effusions. No pneumothorax. Normal heart size. IMPRESSION: Stable hardware positioning and aeration Electronically Signed   By: Jorje Guild M.D.   On: 12/19/2021 06:12   DG Chest 1 View  Result Date: 12/16/2021 CLINICAL DATA:  Respiratory failure with pleural effusion EXAM: CHEST  1 VIEW COMPARISON:  Two days ago FINDINGS: Endotracheal tube with tip just below the clavicular heads. A feeding tube at least reaches the stomach. Improved right upper lobe aeration. Diffuse reticular opacity with volume loss and calcification. Small right pleural effusion. IMPRESSION: 1. Unremarkable hardware. 2. Improved right upper lobe aeration. 3. Extensive chronic lung disease. Electronically Signed   By: Jorje Guild M.D.   On: 12/16/2021 06:22   ECHOCARDIOGRAM COMPLETE  Result Date: 12/15/2021    ECHOCARDIOGRAM REPORT   Patient Name:   Kenneth Fischer Date of Exam: 12/15/2021 Medical Rec #:  [id number]     Height:       72.0 in Accession #:    [id number]    Weight:       208.5 lb Date of Birth:  1950-08-16      BSA:          2.169 m Patient Age:    44 years      BP:           131/65 mmHg Patient Gender: M             HR:           52 bpm. Exam Location:  Inpatient Procedure: 2D Echo, Color Doppler and Cardiac Doppler Indications:    Abnormal ECG  History:        Patient has no prior history of Echocardiogram examinations.                 COPD; Risk Factors:Hypertension.  Sonographer:    Memory Argue  Referring Phys: [id number] [place]  1. Left ventricular ejection fraction, by estimation, is 55 to 60%. The left ventricle has normal function. The left ventricle has no regional wall motion abnormalities. There is mild concentric left ventricular hypertrophy. Left ventricular diastolic parameters are consistent with Grade I diastolic dysfunction (impaired relaxation).  2. Right ventricular systolic function is normal. The right ventricular size is normal.  3. Left atrial size was mildly dilated.  4. The mitral valve is degenerative. Mild mitral valve regurgitation.  5. The aortic valve is tricuspid. There is mild calcification of the aortic valve. There is mild thickening of the aortic valve. Aortic valve regurgitation is trivial. Aortic valve sclerosis is present, with no evidence of aortic valve stenosis.  6. The inferior vena cava is dilated in size with <50% respiratory variability, suggesting right atrial pressure of 15 mmHg. FINDINGS  Left Ventricle: Left ventricular ejection fraction, by estimation, is 55 to 60%. The left ventricle has normal function. The left ventricle has no regional wall motion abnormalities. The left ventricular internal cavity size was normal in size. There is  mild concentric left ventricular hypertrophy. Left ventricular diastolic parameters are consistent with Grade I diastolic dysfunction (impaired relaxation). Right Ventricle: The right ventricular size is normal. No increase in right ventricular wall thickness. Right ventricular systolic function  is normal. Left Atrium: Left atrial size was mildly dilated. Right Atrium: Right atrial size was normal in size. Pericardium: There is no evidence of pericardial effusion. Mitral Valve: The mitral valve is degenerative in appearance. There is mild thickening of the anterior mitral valve leaflet(s). There is mild calcification of the anterior mitral valve leaflet(s). Mild mitral annular calcification. Mild mitral  valve regurgitation. Tricuspid Valve: The tricuspid valve is normal in structure. Tricuspid valve regurgitation is mild. Aortic Valve: The aortic valve is tricuspid. There is mild calcification of the aortic valve. There is mild thickening of the aortic valve. Aortic valve regurgitation is trivial. Aortic valve sclerosis is present, with no evidence of aortic valve stenosis. Aortic valve mean gradient measures 3.0 mmHg. Aortic valve peak gradient measures 5.3 mmHg. Aortic valve area, by VTI measures 4.97 cm. Pulmonic Valve: The pulmonic valve was normal in structure. Pulmonic valve regurgitation is trivial. Aorta: The aortic root is normal in size and structure. There is minimal (Grade I) atheroma plaque involving the ascending aorta. Venous: The inferior vena cava is dilated in size with less than 50% respiratory variability, suggesting right atrial pressure of 15 mmHg. IAS/Shunts: The atrial septum is grossly normal.  LEFT VENTRICLE PLAX 2D LVIDd:         5.40 cm   Diastology LVIDs:         3.90 cm   LV e' medial:    7.62 cm/s LV PW:         1.20 cm   LV E/e' medial:  8.4 LV IVS:        1.40 cm   LV e' lateral:   10.10 cm/s LVOT diam:     2.80 cm   LV E/e' lateral: 6.3 LV SV:         126 LV SV Index:   58 LVOT Area:     6.16 cm  RIGHT VENTRICLE TAPSE (M-mode): 2.1 cm LEFT ATRIUM             Index        RIGHT ATRIUM           Index LA diam:        3.50 cm 1.61 cm/m   RA Area:     12.80 cm LA Vol (A2C):   50.6 ml 23.33 ml/m  RA Volume:   29.10 ml  13.42 ml/m LA Vol (A4C):   58.1 ml 26.79 ml/m LA Biplane Vol: 55.0 ml 25.36 ml/m  AORTIC VALVE AV Area (Vmax):    5.23 cm AV Area (Vmean):   4.82 cm AV Area (VTI):     4.97 cm AV Vmax:           115.00 cm/s AV Vmean:          79.600 cm/s AV VTI:            0.254 m AV Peak Grad:      5.3 mmHg AV Mean Grad:      3.0 mmHg LVOT Vmax:         97.70 cm/s LVOT Vmean:        62.300 cm/s LVOT VTI:          0.205 m LVOT/AV VTI ratio: 0.81  AORTA Ao Root diam: 3.80 cm  MITRAL VALVE MV Area (PHT): 2.66 cm     SHUNTS MV Decel Time: 285 msec     Systemic VTI:  0.20 m MV E velocity: 63.90 cm/s   Systemic Diam: 2.80 cm MV A velocity:  101.00 cm/s MV E/A ratio:  0.63 Dixie Dials MD Electronically signed by Dixie Dials MD Signature Date/Time: 12/15/2021/2:25:58 PM    Final    DG CHEST PORT 1 VIEW  Result Date: 12/14/2021 CLINICAL DATA:  Pleural effusion EXAM: PORTABLE CHEST 1 VIEW COMPARISON:  Radiograph 12/11/2021 FINDINGS: Endotracheal tube tip overlies the trachea proximally 4.1 cm above the carina. Feeding tube passes below the diaphragm, tip excluded by collimation. Previous nasogastric tube is been removed. Unchanged cardiomediastinal silhouette. Increased right pleural effusion layering to the apex. There are similar interstitial and airspace opacities bilaterally. Unchanged small left pleural effusion. No evidence of pneumothorax. Bones are unchanged. IMPRESSION: Increased right pleural effusion layering to the apex. Unchanged small left pleural effusion. Similar interstitial and airspace disease bilaterally. Endotracheal tube overlies the midthoracic trachea approximately 4.1 cm above the carina. Electronically Signed   By: Maurine Simmering M.D.   On: 12/14/2021 16:12   DG Abd 1 View  Result Date: 12/13/2021 CLINICAL DATA:  Fluoroscopic assistance for placement of feeding tube EXAM: ABDOMEN - 1 VIEW COMPARISON:  Abdomen radiograph done on 12/11/2021 FINDINGS: Fluoroscopic image shows tip of feeding tube in region of proximal duodenum. Fluoroscopic time 2 minutes and 12 seconds. Radiation dose 22 mGy. IMPRESSION: Fluoroscopic assistance was provided for placement of feeding tube. Electronically Signed   By: Elmer Picker M.D.   On: 12/13/2021 12:58   DG Abd Portable 1V  Result Date: 12/11/2021 CLINICAL DATA:  Nasogastric tube placement. EXAM: PORTABLE ABDOMEN - 1 VIEW COMPARISON:  12/09/2021. FINDINGS: No nasogastric tube on the included field of view. Persistent  bilateral lung base opacities consistent with pleural effusions with atelectasis/infection. Normal bowel gas pattern. IMPRESSION: No visualized nasal/orogastric tube. Electronically Signed   By: Lajean Manes M.D.   On: 12/11/2021 13:20   DG Chest Port 1 View  Result Date: 12/11/2021 CLINICAL DATA:  Endotracheal and nasogastric tube. EXAM: PORTABLE CHEST 1 VIEW COMPARISON:  12/11/2021 at 6:50 a.m. FINDINGS: Endotracheal tube tip now lies 3.7 cm above the carina, well positioned. Nasogastric tube tip projects in the neck, to the left of midline. Bilateral perihilar and lung base interstitial and hazy airspace lung opacities, without significant change from the exam obtained earlier today. Small effusions. No pneumothorax. IMPRESSION: 1. Nasal/orogastric tube is malposition, tip projecting within the neck, to the left of midline. 2. Well-positioned endotracheal tube. 3. Persistent lung opacities, unchanged from the exam obtained earlier today. No pneumothorax. Electronically Signed   By: Lajean Manes M.D.   On: 12/11/2021 13:19   DG Chest 1 View  Result Date: 12/11/2021 CLINICAL DATA:  71 year old male with history of respiratory failure. EXAM: CHEST  1 VIEW COMPARISON:  Chest x-ray 12/09/2021. FINDINGS: Endotracheal tube has been withdrawn substantially, now with tip 6.5 cm above the thoracic inlet and 14.6 cm above the level of the carina. Hypopharyngeal distension is noted. A nasogastric tube is seen extending into the stomach, however, the tip of the nasogastric tube extends below the lower margin of the image. Patient is severely rotated to the left, causing distortion of cardiomediastinal structures. Lung bases are incompletely imaged. With these limitations in mind, there is extensive opacity throughout the left mid to lower lung which may reflect atelectasis and/or consolidation, along with a superimposed moderate left pleural effusion. Patchy irregular opacities are also noted throughout the right  lung, similar to prior examinations, likely chronic and related to systemic disease such as sarcoidosis. Probable moderate right pleural effusion partially imaged. No evidence of pulmonary edema. Cardiac silhouette  is obscured. IMPRESSION: 1. Support apparatus, as above. This patient appears essentially extubated, and repositioning of the endotracheal tube is strongly recommended at this time. 2. Worsening aeration in the lungs, particularly in the left mid to lower lung, compatible with increasing atelectasis and/or consolidation and increasing moderate left pleural effusion. These results will be called to the ordering clinician or representative by the Radiologist Assistant, and communication documented in the PACS or [employer]. Electronically Signed   By: Vinnie Langton M.D.   On: 12/11/2021 07:23    Labs:  CBC: Recent Labs    12/31/21 0325 01/03/22 0123 01/05/22 0100 01/08/22 0130  WBC 4.4 7.0 4.5 5.0  HGB 11.5* 11.8* 10.8* 11.2*  HCT 35.9* 36.8* 34.4* 33.6*  PLT 144* 170 172 189    COAGS: No results for input(s): "INR", "APTT" in the last 8760 hours.  BMP: Recent Labs    01/05/22 0100 01/06/22 0110 01/08/22 0130 01/09/22 0201  NA 134* 137 135 133*  K 4.8 5.0 4.8 4.5  CL 95* 96* 93* 93*  CO2 30 32 34* 32  GLUCOSE 99 102* 100* 106*  BUN 39* 36* 34* 37*  CALCIUM 8.1* 8.5* 8.3* 8.3*  CREATININE 0.66 0.65 0.66 0.67  GFRNONAA >60 >60 >60 >60    LIVER FUNCTION TESTS: Recent Labs    02/18/21 0958 08/09/21 0947  BILITOT 0.4 0.6  AST 19 18  ALT 15 11  ALKPHOS 142* 149*  PROT 7.0 7.5  ALBUMIN 3.6 4.0    Assessment and Plan:  Respiratory failure --difficult vent weaning --pneumonia --aspiration risk --NG tube feeds for many weeks, tolerating well. --G-tube requested.  Imaging reviewed by Dr. Maryelizabeth Kaufmann who approves percutaneous placement. --On Lovenox '80mg'$ /BID, will need to hold 1 dose.  Hold Tuesday AM dose in case schedule allows for proceeding.  Labs and  antibiotic ordered.  Plan communicated with Katharine Look, RN at [facility].  Risks and benefits image guided gastrostomy tube placement was discussed with the patient including, but not limited to the need for a barium enema during the procedure, bleeding, infection, peritonitis and/or damage to adjacent structures.  All of the patient's questions were answered, patient is agreeable to proceed.  Consent signed and in chart.    Thank you for this interesting consult.  I greatly enjoyed meeting Kenneth Fischer and look forward to participating in their care.  A copy of this report was sent to the requesting provider on this date.  Electronically Signed: Pasty Spillers, PA 01/09/2022, 2:51 PM   I spent a total of 40 Minutes in face to face in clinical consultation, greater than 50% of which was counseling/coordinating care for percutaneous gastrostomy

## 2022-01-10 DIAGNOSIS — N1831 Chronic kidney disease, stage 3a: Secondary | ICD-10-CM | POA: Diagnosis not present

## 2022-01-10 DIAGNOSIS — J449 Chronic obstructive pulmonary disease, unspecified: Secondary | ICD-10-CM | POA: Diagnosis not present

## 2022-01-10 DIAGNOSIS — J9621 Acute and chronic respiratory failure with hypoxia: Secondary | ICD-10-CM | POA: Diagnosis not present

## 2022-01-10 DIAGNOSIS — I5032 Chronic diastolic (congestive) heart failure: Secondary | ICD-10-CM | POA: Diagnosis not present

## 2022-01-10 LAB — MISC LABCORP TEST (SEND OUT): Labcorp test code: 9985

## 2022-01-10 LAB — PROTIME-INR
INR: 1.1 (ref 0.8–1.2)
Prothrombin Time: 13.7 seconds (ref 11.4–15.2)

## 2022-01-10 NOTE — Progress Notes (Signed)
Pulmonary Critical Care Medicine [place]   PULMONARY CRITICAL CARE SERVICE  PROGRESS NOTE     Kenneth Fischer  [id number]:[id number]  DOB: September 11, 1950   DOA: 12/20/2021  Referring Physician: Satira Sark, MD  HPI: Kenneth Fischer is a 71 y.o. male being followed for ventilator/airway/oxygen weaning Acute on Chronic Respiratory Failure. ***  Medications: Reviewed on Rounds  Physical Exam:  Vitals: ***  Ventilator Settings ***  General: Comfortable at this time Neck: supple Cardiovascular: no malignant arrhythmias Respiratory: *** Skin: no rash seen on limited exam Musculoskeletal: No gross abnormality Psychiatric:unable to assess Neurologic:no involuntary movements         Lab Data:   Basic Metabolic Panel: Recent Labs  Lab 01/05/22 0100 01/06/22 0110 01/08/22 0130 01/09/22 0201  NA 134* 137 135 133*  K 4.8 5.0 4.8 4.5  CL 95* 96* 93* 93*  CO2 30 32 34* 32  GLUCOSE 99 102* 100* 106*  BUN 39* 36* 34* 37*  CREATININE 0.66 0.65 0.66 0.67  CALCIUM 8.1* 8.5* 8.3* 8.3*  MG  --   --  2.1  --     ABG: No results for input(s): "PHART", "PCO2ART", "PO2ART", "HCO3", "O2SAT" in the last 168 hours.  Liver Function Tests: No results for input(s): "AST", "ALT", "ALKPHOS", "BILITOT", "PROT", "ALBUMIN" in the last 168 hours. No results for input(s): "LIPASE", "AMYLASE" in the last 168 hours. No results for input(s): "AMMONIA" in the last 168 hours.  CBC: Recent Labs  Lab 01/05/22 0100 01/08/22 0130  WBC 4.5 5.0  HGB 10.8* 11.2*  HCT 34.4* 33.6*  MCV 90.8 88.4  PLT 172 189    Cardiac Enzymes: No results for input(s): "CKTOTAL", "CKMB", "CKMBINDEX", "TROPONINI" in the last 168 hours.  BNP (last 3 results) No results for input(s): "BNP" in the last 8760 hours.  ProBNP (last 3 results) No results for input(s): "PROBNP" in the last 8760 hours.  Radiological Exams: No results found.  Assessment/Plan Active Problems:   CKD (chronic kidney  disease), stage III (HCC)   COPD, severe (HCC)   Acute on chronic respiratory failure with hypoxia (HCC)   Healthcare-associated pneumonia   Chronic heart failure with preserved ejection fraction (HCC)   *** *** *** *** ***   I have personally seen and evaluated the patient, evaluated laboratory and imaging results, formulated the assessment and plan and placed orders. The Patient requires high complexity decision making with multiple systems involvement.  Rounds were done with the Respiratory Therapy Director and Staff therapists and discussed with nursing staff also.  Allyne Gee, MD [facility] Pulmonary Critical Care Medicine Sleep Medicine

## 2022-01-11 ENCOUNTER — Other Ambulatory Visit (HOSPITAL_COMMUNITY): Payer: Medicare Other

## 2022-01-11 DIAGNOSIS — J9621 Acute and chronic respiratory failure with hypoxia: Secondary | ICD-10-CM | POA: Diagnosis not present

## 2022-01-11 DIAGNOSIS — J449 Chronic obstructive pulmonary disease, unspecified: Secondary | ICD-10-CM | POA: Diagnosis not present

## 2022-01-11 DIAGNOSIS — N1831 Chronic kidney disease, stage 3a: Secondary | ICD-10-CM | POA: Diagnosis not present

## 2022-01-11 DIAGNOSIS — I5032 Chronic diastolic (congestive) heart failure: Secondary | ICD-10-CM | POA: Diagnosis not present

## 2022-01-11 HISTORY — PX: IR GASTROSTOMY TUBE MOD SED: IMG625

## 2022-01-11 MED ORDER — LIDOCAINE HCL 1 % IJ SOLN
INTRAMUSCULAR | Status: AC
  Administered 2022-01-11: 10 mL
  Filled 2022-01-11: qty 20

## 2022-01-11 MED ORDER — LIDOCAINE VISCOUS HCL 2 % MT SOLN
OROMUCOSAL | Status: AC
  Filled 2022-01-11: qty 15

## 2022-01-11 MED ORDER — MIDAZOLAM HCL 2 MG/2ML IJ SOLN
INTRAMUSCULAR | Status: AC | PRN
  Administered 2022-01-11 (×2): .5 mg via INTRAVENOUS

## 2022-01-11 MED ORDER — FENTANYL CITRATE (PF) 100 MCG/2ML IJ SOLN
INTRAMUSCULAR | Status: AC
  Filled 2022-01-11: qty 2

## 2022-01-11 MED ORDER — CEFAZOLIN SODIUM-DEXTROSE 2-4 GM/100ML-% IV SOLN
INTRAVENOUS | Status: AC | PRN
  Administered 2022-01-11: 2 g via INTRAVENOUS

## 2022-01-11 MED ORDER — FENTANYL CITRATE (PF) 100 MCG/2ML IJ SOLN
INTRAMUSCULAR | Status: AC | PRN
  Administered 2022-01-11: 25 ug via INTRAVENOUS

## 2022-01-11 MED ORDER — CEFAZOLIN SODIUM-DEXTROSE 2-4 GM/100ML-% IV SOLN
INTRAVENOUS | Status: AC
  Filled 2022-01-11: qty 100

## 2022-01-11 MED ORDER — GLUCAGON HCL RDNA (DIAGNOSTIC) 1 MG IJ SOLR
INTRAMUSCULAR | Status: AC
  Filled 2022-01-11: qty 1

## 2022-01-11 MED ORDER — IOHEXOL 300 MG/ML  SOLN
50.0000 mL | Freq: Once | INTRAMUSCULAR | Status: AC | PRN
  Administered 2022-01-11: 15 mL

## 2022-01-11 MED ORDER — MIDAZOLAM HCL 2 MG/2ML IJ SOLN
INTRAMUSCULAR | Status: AC
  Filled 2022-01-11: qty 2

## 2022-01-11 NOTE — Progress Notes (Signed)
Pulmonary Critical Care Medicine [place]   PULMONARY CRITICAL CARE SERVICE  PROGRESS NOTE     Kenneth Fischer  UXN:[id number]  DOB: Jun 08, 1950   DOA: 12/20/2021  Referring Physician: Satira Sark, MD  HPI: Kenneth Fischer is a 71 y.o. male being followed for ventilator/airway/oxygen weaning Acute on Chronic Respiratory Failure.  Patient is currently on T collar on 28% FiO2  Medications: Reviewed on Rounds  Physical Exam:  Vitals: Temperature is 97.7 pulse 59 respiratory 16 blood pressure 149/77 saturations 96%  Ventilator Settings patient is on T collar on 28% FiO2  General: Comfortable at this time Neck: supple Cardiovascular: no malignant arrhythmias Respiratory: No rhonchi no rales are noted Skin: no rash seen on limited exam Musculoskeletal: No gross abnormality Psychiatric:unable to assess Neurologic:no involuntary movements         Lab Data:   Basic Metabolic Panel: Recent Labs  Lab 01/05/22 0100 01/06/22 0110 01/08/22 0130 01/09/22 0201  NA 134* 137 135 133*  K 4.8 5.0 4.8 4.5  CL 95* 96* 93* 93*  CO2 30 32 34* 32  GLUCOSE 99 102* 100* 106*  BUN 39* 36* 34* 37*  CREATININE 0.66 0.65 0.66 0.67  CALCIUM 8.1* 8.5* 8.3* 8.3*  MG  --   --  2.1  --     ABG: No results for input(s): "PHART", "PCO2ART", "PO2ART", "HCO3", "O2SAT" in the last 168 hours.  Liver Function Tests: No results for input(s): "AST", "ALT", "ALKPHOS", "BILITOT", "PROT", "ALBUMIN" in the last 168 hours. No results for input(s): "LIPASE", "AMYLASE" in the last 168 hours. No results for input(s): "AMMONIA" in the last 168 hours.  CBC: Recent Labs  Lab 01/05/22 0100 01/08/22 0130  WBC 4.5 5.0  HGB 10.8* 11.2*  HCT 34.4* 33.6*  MCV 90.8 88.4  PLT 172 189    Cardiac Enzymes: No results for input(s): "CKTOTAL", "CKMB", "CKMBINDEX", "TROPONINI" in the last 168 hours.  BNP (last 3 results) No results for input(s): "BNP" in the last 8760 hours.  ProBNP  (last 3 results) No results for input(s): "PROBNP" in the last 8760 hours.  Radiological Exams: No results found.  Assessment/Plan Active Problems:   CKD (chronic kidney disease), stage III (HCC)   COPD, severe (HCC)   Acute on chronic respiratory failure with hypoxia (HCC)   Healthcare-associated pneumonia   Chronic heart failure with preserved ejection fraction (HCC)   Acute on chronic respiratory failure with hypoxia patient is continuing to wean goal of 48 hours on T collar Severe COPD medical management we will continue to follow along Chronic kidney disease stage III following patient's labs Healthcare associated pneumonia has been treated with antibiotics Chronic heart failure preserved ejection fraction monitor fluid status closely   I have personally seen and evaluated the patient, evaluated laboratory and imaging results, formulated the assessment and plan and placed orders. The Patient requires high complexity decision making with multiple systems involvement.  Rounds were done with the Respiratory Therapy Director and Staff therapists and discussed with nursing staff also.  Kenneth Gee, MD [facility] Pulmonary Critical Care Medicine Sleep Medicine

## 2022-01-11 NOTE — Procedures (Signed)
Interventional Radiology Procedure Note  Procedure: Percutaneous gastrostomy tube placement  Complications: None  Estimated Blood Loss: None  Findings: 20 Fr bumper retention gastrostomy tube placed with tip in body of stomach. OK to use in 24 hours.  Venetia Night. Kathlene Cote, M.D Pager:  [phone]

## 2022-01-12 ENCOUNTER — Other Ambulatory Visit: Payer: Self-pay

## 2022-01-12 DIAGNOSIS — N1831 Chronic kidney disease, stage 3a: Secondary | ICD-10-CM | POA: Diagnosis not present

## 2022-01-12 DIAGNOSIS — I5032 Chronic diastolic (congestive) heart failure: Secondary | ICD-10-CM | POA: Diagnosis not present

## 2022-01-12 DIAGNOSIS — J9621 Acute and chronic respiratory failure with hypoxia: Secondary | ICD-10-CM | POA: Diagnosis not present

## 2022-01-12 DIAGNOSIS — J449 Chronic obstructive pulmonary disease, unspecified: Secondary | ICD-10-CM | POA: Diagnosis not present

## 2022-01-12 LAB — BASIC METABOLIC PANEL
Anion gap: 7 (ref 5–15)
BUN: 27 mg/dL — ABNORMAL HIGH (ref 8–23)
CO2: 34 mmol/L — ABNORMAL HIGH (ref 22–32)
Calcium: 8.7 mg/dL — ABNORMAL LOW (ref 8.9–10.3)
Chloride: 95 mmol/L — ABNORMAL LOW (ref 98–111)
Creatinine, Ser: 0.64 mg/dL (ref 0.61–1.24)
GFR, Estimated: >60 mL/min (ref >60)
Glucose, Bld: 88 mg/dL (ref 70–99)
Potassium: 4.2 mmol/L (ref 3.5–5.1)
Sodium: 136 mmol/L (ref 135–145)

## 2022-01-12 LAB — CBC
HCT: 37.9 % — ABNORMAL LOW (ref 39.0–52.0)
Hemoglobin: 12.6 g/dL — ABNORMAL LOW (ref 13.0–17.0)
MCH: 29.7 pg (ref 26.0–34.0)
MCHC: 33.2 g/dL (ref 30.0–36.0)
MCV: 89.4 fL (ref 80.0–100.0)
Platelets: 233 10*3/uL (ref 150–400)
RBC: 4.24 MIL/uL (ref 4.22–5.81)
RDW: 16.1 % — ABNORMAL HIGH (ref 11.5–15.5)
WBC: 10.2 10*3/uL (ref 4.0–10.5)
nRBC: 0 % (ref 0.0–0.2)

## 2022-01-13 DIAGNOSIS — N1831 Chronic kidney disease, stage 3a: Secondary | ICD-10-CM | POA: Diagnosis not present

## 2022-01-13 DIAGNOSIS — J449 Chronic obstructive pulmonary disease, unspecified: Secondary | ICD-10-CM | POA: Diagnosis not present

## 2022-01-13 DIAGNOSIS — I5032 Chronic diastolic (congestive) heart failure: Secondary | ICD-10-CM | POA: Diagnosis not present

## 2022-01-13 DIAGNOSIS — J9621 Acute and chronic respiratory failure with hypoxia: Secondary | ICD-10-CM | POA: Diagnosis not present

## 2022-01-13 LAB — BLOOD GAS, ARTERIAL
Acid-Base Excess: 7.8 mmol/L — ABNORMAL HIGH (ref 0.0–2.0)
Bicarbonate: 36.6 mmol/L — ABNORMAL HIGH (ref 20.0–28.0)
O2 Saturation: 92.3 %
Patient temperature: 36.2
pCO2 arterial: 69 mmHg (ref 32–48)
pH, Arterial: 7.33 — ABNORMAL LOW (ref 7.35–7.45)
pO2, Arterial: 56 mmHg — ABNORMAL LOW (ref 83–108)

## 2022-01-13 NOTE — Progress Notes (Cosign Needed)
Pulmonary Critical Care Medicine [facility]   PULMONARY CRITICAL CARE SERVICE  PROGRESS NOTE     Kenneth Fischer  MRN:[id number]  DOB: 12/03/1950   DOA: 12/20/2021  Referring Physician: Stephanie Brown, MD  HPI: Kenneth Fischer is a 71 y.o. male being followed for ventilator/airway/oxygen weaning Acute on Chronic Respiratory Failure.  Patient seen lying in bed, currently on ATC 35% and doing well.  Will go ahead and deflate cuff today and see how patient tolerates.  Medications: Reviewed on Rounds  Physical Exam:  Vitals: Temp 98.2, pulse 80, respirations 20, BP 118/65, SpO2 94%  Ventilator Settings  ATC 35%  General: Comfortable at this time Neck: supple Cardiovascular: no malignant arrhythmias Respiratory: Bilaterally diminished Skin: no rash seen on limited exam Musculoskeletal: No gross abnormality Psychiatric:unable to assess Neurologic:no involuntary movements         Lab Data:   Basic Metabolic Panel: Recent Labs  Lab 01/08/22 0130 01/09/22 0201 01/12/22 0610  NA 135 133* 136  K 4.8 4.5 4.2  CL 93* 93* 95*  CO2 34* 32 34*  GLUCOSE 100* 106* 88  BUN 34* 37* 27*  CREATININE 0.66 0.67 0.64  CALCIUM 8.3* 8.3* 8.7*  MG 2.1  --   --     ABG: Recent Labs  Lab 01/13/22 0630  PHART 7.33*  PCO2ART 69*  PO2ART 56*  HCO3 36.6*  O2SAT 92.3    Liver Function Tests: No results for input(s): "AST", "ALT", "ALKPHOS", "BILITOT", "PROT", "ALBUMIN" in the last 168 hours. No results for input(s): "LIPASE", "AMYLASE" in the last 168 hours. No results for input(s): "AMMONIA" in the last 168 hours.  CBC: Recent Labs  Lab 01/08/22 0130 01/12/22 0610  WBC 5.0 10.2  HGB 11.2* 12.6*  HCT 33.6* 37.9*  MCV 88.4 89.4  PLT 189 233    Cardiac Enzymes: No results for input(s): "CKTOTAL", "CKMB", "CKMBINDEX", "TROPONINI" in the last 168 hours.  BNP (last 3 results) No results for input(s): "BNP" in the last 8760 hours.  ProBNP (last 3  results) No results for input(s): "PROBNP" in the last 8760 hours.  Radiological Exams: IR GASTROSTOMY TUBE MOD SED  Result Date: 01/11/2022 CLINICAL DATA:  Respiratory failure, pneumonia and need for gastrostomy tube for long-term nutrition. EXAM: PERCUTANEOUS GASTROSTOMY TUBE PLACEMENT ANESTHESIA/SEDATION: Moderate (conscious) sedation was employed during this procedure. A total of Versed 1.0 mg and Fentanyl 25 mcg was administered intravenously by radiology nursing. Moderate Sedation Time: 10 minutes. The patient's level of consciousness and vital signs were monitored continuously by radiology nursing throughout the procedure under my direct supervision. CONTRAST:  15mL OMNIPAQUE IOHEXOL 300 MG/ML  SOLN MEDICATIONS: 2 g IV Ancef. IV antibiotic was administered in an appropriate time interval prior to needle puncture of the skin. FLUOROSCOPY TIME:  4 minutes and 30 seconds.  19.0 mGy. PROCEDURE: The procedure, risks, benefits, and alternatives were explained to the patient's brother. Questions regarding the procedure were encouraged and answered. The patient's brother understands and consents to the procedure. A 5-French catheter was then advanced through the patient's mouth under fluoroscopy into the esophagus and to the level of the stomach. This catheter was used to insufflate the stomach with air under fluoroscopy. The abdominal wall was prepped with chlorhexidine in a sterile fashion, and a sterile drape was applied covering the operative field. A sterile gown and sterile gloves were used for the procedure. Local anesthesia was provided with 1% Lidocaine. A skin incision was made in the upper abdominal wall. Under fluoroscopy, an   18 gauge trocar needle was advanced into the stomach. Contrast injection was performed to confirm intraluminal position of the needle tip. A single T tack was then deployed in the lumen of the stomach. This was brought up to tension at the skin surface. Over a guidewire, a  9-French sheath was advanced into the lumen of the stomach. The wire was left in place as a safety wire. A loop snare device from a percutaneous gastrostomy kit was then advanced into the stomach. A floppy guide wire was advanced through the orogastric catheter under fluoroscopy in the stomach. The loop snare advanced through the percutaneous gastric access was used to snare the guide wire. This allowed withdrawal of the loop snare out of the patient's mouth by retraction of the orogastric catheter and wire. A 20-French bumper retention gastrostomy tube was looped around the snare device. It was then pulled back through the patient's mouth. The retention bumper was brought up to the anterior gastric wall. The T tack suture was cut at the skin. The exiting gastrostomy tube was cut to appropriate length and a feeding adapter applied. The catheter was injected with contrast material to confirm position and a fluoroscopic spot image saved. The tube was then flushed with saline. A dressing was applied over the gastrostomy exit site. COMPLICATIONS: None. FINDINGS: The stomach distended well with air allowing safe placement of the gastrostomy tube. After placement, the tip of the gastrostomy tube lies in the body of the stomach. IMPRESSION: Percutaneous gastrostomy with placement of a 20-French bumper retention tube in the body of the stomach. This tube can be used for percutaneous feeds beginning in 24 hours after placement. Electronically Signed   By: Aletta Edouard M.D.   On: 01/11/2022 17:01    Assessment/Plan Active Problems:   CKD (chronic kidney disease), stage III (HCC)   COPD, severe (HCC)   Acute on chronic respiratory failure with hypoxia (HCC)   Healthcare-associated pneumonia   Chronic heart failure with preserved ejection fraction (HCC)   Acute on chronic respiratory failure with hypoxia-patient remains on ATC 35%.  Continue supportive care. Severe COPD medical management we will continue to  follow along Chronic kidney disease stage III following patient's labs Healthcare associated pneumonia has been treated with antibiotics Chronic heart failure preserved ejection fraction monitor fluid status closely.   I have personally seen and evaluated the patient, evaluated laboratory and imaging results, formulated the assessment and plan and placed orders. The Patient requires high complexity decision making with multiple systems involvement.  Rounds were done with the Respiratory Therapy Director and Staff therapists and discussed with nursing staff also.  Allyne Gee, MD [facility] Pulmonary Critical Care Medicine Sleep Medicine

## 2022-01-13 NOTE — Progress Notes (Cosign Needed)
Pulmonary Critical Care Medicine [place]   PULMONARY CRITICAL CARE SERVICE  PROGRESS NOTE     Kenneth Fischer  [id number]:[id number]  DOB: [birth year]   DOA: 12/20/2021  Referring Physician: Satira Sark, MD  HPI: Kenneth Fischer is a 71 y.o. male being followed for ventilator/airway/oxygen weaning Acute on Chronic Respiratory Failure.  Patient seen lying in bed, currently on full support ventilation.  According to respiratory therapy he had to be placed back on full support ventilation after being on ATC for most 2 days.  ABG with hypercapnia.  Will go ahead and place on ATC during the day with full support ventilation at night for the time being.  Medications: Reviewed on Rounds  Physical Exam:  Vitals: Temp 97.7, pulse 72, respirations 26, BP 101/57, SpO2 92%  Ventilator Settings AC VC, FiO2 35%, tidal volume 500, rate 16, PEEP 5  General: Comfortable at this time Neck: supple Cardiovascular: no malignant arrhythmias Respiratory: Bilaterally diminished Skin: no rash seen on limited exam Musculoskeletal: No gross abnormality Psychiatric:unable to assess Neurologic:no involuntary movements         Lab Data:   Basic Metabolic Panel: Recent Labs  Lab 01/08/22 0130 01/09/22 0201 01/12/22 0610  NA 135 133* 136  K 4.8 4.5 4.2  CL 93* 93* 95*  CO2 34* 32 34*  GLUCOSE 100* 106* 88  BUN 34* 37* 27*  CREATININE 0.66 0.67 0.64  CALCIUM 8.3* 8.3* 8.7*  MG 2.1  --   --     ABG: Recent Labs  Lab 01/13/22 0630  PHART 7.33*  PCO2ART 69*  PO2ART 56*  HCO3 36.6*  O2SAT 92.3    Liver Function Tests: No results for input(s): "AST", "ALT", "ALKPHOS", "BILITOT", "PROT", "ALBUMIN" in the last 168 hours. No results for input(s): "LIPASE", "AMYLASE" in the last 168 hours. No results for input(s): "AMMONIA" in the last 168 hours.  CBC: Recent Labs  Lab 01/08/22 0130 01/12/22 0610  WBC 5.0 10.2  HGB 11.2* 12.6*  HCT 33.6* 37.9*  MCV 88.4 89.4  PLT  189 233    Cardiac Enzymes: No results for input(s): "CKTOTAL", "CKMB", "CKMBINDEX", "TROPONINI" in the last 168 hours.  BNP (last 3 results) No results for input(s): "BNP" in the last 8760 hours.  ProBNP (last 3 results) No results for input(s): "PROBNP" in the last 8760 hours.  Radiological Exams: IR GASTROSTOMY TUBE MOD SED  Result Date: 01/11/2022 CLINICAL DATA:  Respiratory failure, pneumonia and need for gastrostomy tube for long-term nutrition. EXAM: PERCUTANEOUS GASTROSTOMY TUBE PLACEMENT ANESTHESIA/SEDATION: Moderate (conscious) sedation was employed during this procedure. A total of Versed 1.0 mg and Fentanyl 25 mcg was administered intravenously by radiology nursing. Moderate Sedation Time: 10 minutes. The patient's level of consciousness and vital signs were monitored continuously by radiology nursing throughout the procedure under my direct supervision. CONTRAST:  36m OMNIPAQUE IOHEXOL 300 MG/ML  SOLN MEDICATIONS: 2 g IV Ancef. IV antibiotic was administered in an appropriate time interval prior to needle puncture of the skin. FLUOROSCOPY TIME:  4 minutes and 30 seconds.  19.0 mGy. PROCEDURE: The procedure, risks, benefits, and alternatives were explained to the patient's brother. Questions regarding the procedure were encouraged and answered. The patient's brother understands and consents to the procedure. A 5-French catheter was then advanced through the patient's mouth under fluoroscopy into the esophagus and to the level of the stomach. This catheter was used to insufflate the stomach with air under fluoroscopy. The abdominal wall was prepped with chlorhexidine in a sterile fashion,  and a sterile drape was applied covering the operative field. A sterile gown and sterile gloves were used for the procedure. Local anesthesia was provided with 1% Lidocaine. A skin incision was made in the upper abdominal wall. Under fluoroscopy, an 18 gauge trocar needle was advanced into the stomach.  Contrast injection was performed to confirm intraluminal position of the needle tip. A single T tack was then deployed in the lumen of the stomach. This was brought up to tension at the skin surface. Over a guidewire, a 9-French sheath was advanced into the lumen of the stomach. The wire was left in place as a safety wire. A loop snare device from a percutaneous gastrostomy kit was then advanced into the stomach. A floppy guide wire was advanced through the orogastric catheter under fluoroscopy in the stomach. The loop snare advanced through the percutaneous gastric access was used to snare the guide wire. This allowed withdrawal of the loop snare out of the patient's mouth by retraction of the orogastric catheter and wire. A 20-French bumper retention gastrostomy tube was looped around the snare device. It was then pulled back through the patient's mouth. The retention bumper was brought up to the anterior gastric wall. The T tack suture was cut at the skin. The exiting gastrostomy tube was cut to appropriate length and a feeding adapter applied. The catheter was injected with contrast material to confirm position and a fluoroscopic spot image saved. The tube was then flushed with saline. A dressing was applied over the gastrostomy exit site. COMPLICATIONS: None. FINDINGS: The stomach distended well with air allowing safe placement of the gastrostomy tube. After placement, the tip of the gastrostomy tube lies in the body of the stomach. IMPRESSION: Percutaneous gastrostomy with placement of a 20-French bumper retention tube in the body of the stomach. This tube can be used for percutaneous feeds beginning in 24 hours after placement. Electronically Signed   By: Aletta Edouard M.D.   On: 01/11/2022 17:01    Assessment/Plan Active Problems:   CKD (chronic kidney disease), stage III (HCC)   COPD, severe (HCC)   Acute on chronic respiratory failure with hypoxia (HCC)   Healthcare-associated pneumonia   Chronic  heart failure with preserved ejection fraction (HCC)  Acute on chronic respiratory failure with hypoxia -patient currently on full support ventilation after being on ATC for 48 hours, with ABG with hypercapnia.  Will [place] try patient for couple days on ATC during the day with full support ventilation at night. Severe COPD medical management we will continue to follow along Chronic kidney disease stage III following patient's labs Healthcare associated pneumonia has been treated with antibiotics Chronic heart failure preserved ejection fraction monitor fluid status closely     I have personally seen and evaluated the patient, evaluated laboratory and imaging results, formulated the assessment and plan and placed orders. The Patient requires high complexity decision making with multiple systems involvement.  Rounds were done with the Respiratory Therapy Director and Staff therapists and discussed with nursing staff also.  Allyne Gee, MD [facility] Pulmonary Critical Care Medicine Sleep Medicine

## 2022-01-14 DIAGNOSIS — N1831 Chronic kidney disease, stage 3a: Secondary | ICD-10-CM | POA: Diagnosis not present

## 2022-01-14 DIAGNOSIS — I5032 Chronic diastolic (congestive) heart failure: Secondary | ICD-10-CM | POA: Diagnosis not present

## 2022-01-14 DIAGNOSIS — J449 Chronic obstructive pulmonary disease, unspecified: Secondary | ICD-10-CM | POA: Diagnosis not present

## 2022-01-14 DIAGNOSIS — J9621 Acute and chronic respiratory failure with hypoxia: Secondary | ICD-10-CM | POA: Diagnosis not present

## 2022-01-15 DIAGNOSIS — I5032 Chronic diastolic (congestive) heart failure: Secondary | ICD-10-CM | POA: Diagnosis not present

## 2022-01-15 DIAGNOSIS — J9621 Acute and chronic respiratory failure with hypoxia: Secondary | ICD-10-CM | POA: Diagnosis not present

## 2022-01-15 DIAGNOSIS — N1831 Chronic kidney disease, stage 3a: Secondary | ICD-10-CM | POA: Diagnosis not present

## 2022-01-15 DIAGNOSIS — J449 Chronic obstructive pulmonary disease, unspecified: Secondary | ICD-10-CM | POA: Diagnosis not present

## 2022-01-15 LAB — BASIC METABOLIC PANEL
Anion gap: 9 (ref 5–15)
BUN: 32 mg/dL — ABNORMAL HIGH (ref 8–23)
CO2: 31 mmol/L (ref 22–32)
Calcium: 8.9 mg/dL (ref 8.9–10.3)
Chloride: 94 mmol/L — ABNORMAL LOW (ref 98–111)
Creatinine, Ser: 0.6 mg/dL — ABNORMAL LOW (ref 0.61–1.24)
GFR, Estimated: >60 mL/min (ref >60)
Glucose, Bld: 99 mg/dL (ref 70–99)
Potassium: 4.8 mmol/L (ref 3.5–5.1)
Sodium: 134 mmol/L — ABNORMAL LOW (ref 135–145)

## 2022-01-15 LAB — CBC
HCT: 31.1 % — ABNORMAL LOW (ref 39.0–52.0)
Hemoglobin: 10.7 g/dL — ABNORMAL LOW (ref 13.0–17.0)
MCH: 30 pg (ref 26.0–34.0)
MCHC: 34.4 g/dL (ref 30.0–36.0)
MCV: 87.1 fL (ref 80.0–100.0)
Platelets: 189 10*3/uL (ref 150–400)
RBC: 3.57 MIL/uL — ABNORMAL LOW (ref 4.22–5.81)
RDW: 16.5 % — ABNORMAL HIGH (ref 11.5–15.5)
WBC: 9.1 10*3/uL (ref 4.0–10.5)
nRBC: 0 % (ref 0.0–0.2)

## 2022-01-15 LAB — MAGNESIUM: Magnesium: 2.1 mg/dL (ref 1.7–2.4)

## 2022-01-15 NOTE — Progress Notes (Signed)
Pulmonary Critical Care Medicine [place]   PULMONARY CRITICAL CARE SERVICE  PROGRESS NOTE     Kenneth Fischer  IRC:[id number]  DOB: 12-30-1950   DOA: 12/20/2021  Referring Physician: Satira Sark, MD  HPI: Kenneth Fischer is a 71 y.o. male being followed for ventilator/airway/oxygen weaning Acute on Chronic Respiratory Failure.  Patient is on the ventilator full support assist-control mode has been on 35% FiO2  Medications: Reviewed on Rounds  Physical Exam:  Vitals: Temperature is 97.8 pulse 72 respiratory 24 blood pressure 116/66 saturation is 97%  Ventilator Settings assist-control FiO2 is 35% tidal line 500 PEEP 5  General: Comfortable at this time Neck: supple Cardiovascular: no malignant arrhythmias Respiratory: No rhonchi no rales are noted Skin: no rash seen on limited exam Musculoskeletal: No gross abnormality Psychiatric:unable to assess Neurologic:no involuntary movements         Lab Data:   Basic Metabolic Panel: Recent Labs  Lab 01/09/22 0201 01/12/22 0610 01/15/22 0227  NA 133* 136 134*  K 4.5 4.2 4.8  CL 93* 95* 94*  CO2 32 34* 31  GLUCOSE 106* 88 99  BUN 37* 27* 32*  CREATININE 0.67 0.64 0.60*  CALCIUM 8.3* 8.7* 8.9  MG  --   --  2.1    ABG: Recent Labs  Lab 01/13/22 0630  PHART 7.33*  PCO2ART 69*  PO2ART 56*  HCO3 36.6*  O2SAT 92.3    Liver Function Tests: No results for input(s): "AST", "ALT", "ALKPHOS", "BILITOT", "PROT", "ALBUMIN" in the last 168 hours. No results for input(s): "LIPASE", "AMYLASE" in the last 168 hours. No results for input(s): "AMMONIA" in the last 168 hours.  CBC: Recent Labs  Lab 01/12/22 0610 01/15/22 0227  WBC 10.2 9.1  HGB 12.6* 10.7*  HCT 37.9* 31.1*  MCV 89.4 87.1  PLT 233 189    Cardiac Enzymes: No results for input(s): "CKTOTAL", "CKMB", "CKMBINDEX", "TROPONINI" in the last 168 hours.  BNP (last 3 results) No results for input(s): "BNP" in the last 8760  hours.  ProBNP (last 3 results) No results for input(s): "PROBNP" in the last 8760 hours.  Radiological Exams: No results found.  Assessment/Plan Active Problems:   CKD (chronic kidney disease), stage III (HCC)   COPD, severe (HCC)   Acute on chronic respiratory failure with hypoxia (HCC)   Healthcare-associated pneumonia   Chronic heart failure with preserved ejection fraction (HCC)   Acute on chronic respiratory failure hypoxia will continue with assist-control mode currently on 35% FiO2 continue secretion management pulmonary toilet Severe COPD medical management CKD stage III supportive care monitoring labs Healthcare associated pneumonia has been treated Chronic heart failure preserved ejection fraction no change   I have personally seen and evaluated the patient, evaluated laboratory and imaging results, formulated the assessment and plan and placed orders. The Patient requires high complexity decision making with multiple systems involvement.  Rounds were done with the Respiratory Therapy Director and Staff therapists and discussed with nursing staff also.  Allyne Gee, MD [facility] Pulmonary Critical Care Medicine Sleep Medicine

## 2022-01-16 DIAGNOSIS — N1831 Chronic kidney disease, stage 3a: Secondary | ICD-10-CM | POA: Diagnosis not present

## 2022-01-16 DIAGNOSIS — J9621 Acute and chronic respiratory failure with hypoxia: Secondary | ICD-10-CM | POA: Diagnosis not present

## 2022-01-16 DIAGNOSIS — I5032 Chronic diastolic (congestive) heart failure: Secondary | ICD-10-CM | POA: Diagnosis not present

## 2022-01-16 DIAGNOSIS — J449 Chronic obstructive pulmonary disease, unspecified: Secondary | ICD-10-CM | POA: Diagnosis not present

## 2022-01-17 DIAGNOSIS — J9621 Acute and chronic respiratory failure with hypoxia: Secondary | ICD-10-CM | POA: Diagnosis not present

## 2022-01-17 DIAGNOSIS — J449 Chronic obstructive pulmonary disease, unspecified: Secondary | ICD-10-CM | POA: Diagnosis not present

## 2022-01-17 DIAGNOSIS — I5032 Chronic diastolic (congestive) heart failure: Secondary | ICD-10-CM | POA: Diagnosis not present

## 2022-01-17 DIAGNOSIS — N1831 Chronic kidney disease, stage 3a: Secondary | ICD-10-CM | POA: Diagnosis not present

## 2022-01-18 DIAGNOSIS — J449 Chronic obstructive pulmonary disease, unspecified: Secondary | ICD-10-CM | POA: Diagnosis not present

## 2022-01-18 DIAGNOSIS — N1831 Chronic kidney disease, stage 3a: Secondary | ICD-10-CM | POA: Diagnosis not present

## 2022-01-18 DIAGNOSIS — J9621 Acute and chronic respiratory failure with hypoxia: Secondary | ICD-10-CM | POA: Diagnosis not present

## 2022-01-18 DIAGNOSIS — I5032 Chronic diastolic (congestive) heart failure: Secondary | ICD-10-CM | POA: Diagnosis not present

## 2022-01-18 NOTE — Progress Notes (Cosign Needed)
Pulmonary Critical Care Medicine [place]   PULMONARY CRITICAL CARE SERVICE  PROGRESS NOTE     Bryann Mcnealy  UOH:[id number]  DOB: 21-Oct-1950   DOA: 12/20/2021  Referring Physician: Satira Sark, MD  HPI: Elby Blackwelder is a 71 y.o. male being followed for ventilator/airway/oxygen weaning Acute on Chronic Respiratory Failure.  Patient seen lying in bed, currently on ATC 40%.  Completed 11 hours of pressure support yesterday.  Will continue with ATC as patient tolerates today.  Medications: Reviewed on Rounds  Physical Exam:  Vitals: Temp 97.3, pulse 66, respirations 26, BP 107/59, SpO2 98%   Ventilator Settings ATC 40%  General: Comfortable at this time Neck: supple Cardiovascular: no malignant arrhythmias Respiratory: Bilaterally diminished Skin: no rash seen on limited exam Musculoskeletal: No gross abnormality Psychiatric:unable to assess Neurologic:no involuntary movements         Lab Data:   Basic Metabolic Panel: Recent Labs  Lab 01/12/22 0610 01/15/22 0227  NA 136 134*  K 4.2 4.8  CL 95* 94*  CO2 34* 31  GLUCOSE 88 99  BUN 27* 32*  CREATININE 0.64 0.60*  CALCIUM 8.7* 8.9  MG  --  2.1    ABG: Recent Labs  Lab 01/13/22 0630  PHART 7.33*  PCO2ART 69*  PO2ART 56*  HCO3 36.6*  O2SAT 92.3    Liver Function Tests: No results for input(s): "AST", "ALT", "ALKPHOS", "BILITOT", "PROT", "ALBUMIN" in the last 168 hours. No results for input(s): "LIPASE", "AMYLASE" in the last 168 hours. No results for input(s): "AMMONIA" in the last 168 hours.  CBC: Recent Labs  Lab 01/12/22 0610 01/15/22 0227  WBC 10.2 9.1  HGB 12.6* 10.7*  HCT 37.9* 31.1*  MCV 89.4 87.1  PLT 233 189    Cardiac Enzymes: No results for input(s): "CKTOTAL", "CKMB", "CKMBINDEX", "TROPONINI" in the last 168 hours.  BNP (last 3 results) No results for input(s): "BNP" in the last 8760 hours.  ProBNP (last 3 results) No results for input(s): "PROBNP"  in the last 8760 hours.  Radiological Exams: No results found.  Assessment/Plan Active Problems:   CKD (chronic kidney disease), stage III (HCC)   COPD, severe (HCC)   Acute on chronic respiratory failure with hypoxia (HCC)   Healthcare-associated pneumonia   Chronic heart failure with preserved ejection fraction (HCC)  Acute on chronic respiratory failure with hypoxia currently on ATC 40%.  Continue with ATC as patient tolerates.  Thick secretions remain present.  Continue with aggressive pulmonary toilet. Severe COPD medical management we will continue to follow along Chronic kidney disease stage III following patient's labs Healthcare associated pneumonia has been treated with antibiotics Chronic heart failure preserved ejection fraction monitor fluid status closely   I have personally seen and evaluated the patient, evaluated laboratory and imaging results, formulated the assessment and plan and placed orders. The Patient requires high complexity decision making with multiple systems involvement.  Rounds were done with the Respiratory Therapy Director and Staff therapists and discussed with nursing staff also.  Allyne Gee, MD [facility] Pulmonary Critical Care Medicine Sleep Medicine

## 2022-01-18 NOTE — Progress Notes (Cosign Needed)
Pulmonary Critical Care Medicine [place]   PULMONARY CRITICAL CARE SERVICE  PROGRESS NOTE     Kenneth Fischer  ZOX:[id number]  DOB: August 24, 1950   DOA: 12/20/2021  Referring Physician: Satira Sark, MD  HPI: [name] is a 71 y.o. male being followed for ventilator/airway/oxygen weaning Acute on Chronic Respiratory Failure.  Patient seen lying in bed, currently remains on ATC 35%.  He will be working towards a 20-hour ATC goal today.  Medications: Reviewed on Rounds  Physical Exam:  Vitals: Temp 96.9, pulse 64, respirations 18, BP 108/63, SpO2 95%  Ventilator Settings ATC 35%  General: Comfortable at this time Neck: supple Cardiovascular: no malignant arrhythmias Respiratory: Bilaterally diminished Skin: no rash seen on limited exam Musculoskeletal: No gross abnormality Psychiatric:unable to assess Neurologic:no involuntary movements         Lab Data:   Basic Metabolic Panel: Recent Labs  Lab 01/12/22 0610 01/15/22 0227  NA 136 134*  K 4.2 4.8  CL 95* 94*  CO2 34* 31  GLUCOSE 88 99  BUN 27* 32*  CREATININE 0.64 0.60*  CALCIUM 8.7* 8.9  MG  --  2.1    ABG: Recent Labs  Lab 01/13/22 0630  PHART 7.33*  PCO2ART 69*  PO2ART 56*  HCO3 36.6*  O2SAT 92.3    Liver Function Tests: No results for input(s): "AST", "ALT", "ALKPHOS", "BILITOT", "PROT", "ALBUMIN" in the last 168 hours. No results for input(s): "LIPASE", "AMYLASE" in the last 168 hours. No results for input(s): "AMMONIA" in the last 168 hours.  CBC: Recent Labs  Lab 01/12/22 0610 01/15/22 0227  WBC 10.2 9.1  HGB 12.6* 10.7*  HCT 37.9* 31.1*  MCV 89.4 87.1  PLT 233 189    Cardiac Enzymes: No results for input(s): "CKTOTAL", "CKMB", "CKMBINDEX", "TROPONINI" in the last 168 hours.  BNP (last 3 results) No results for input(s): "BNP" in the last 8760 hours.  ProBNP (last 3 results) No results for input(s): "PROBNP" in the last 8760 hours.  Radiological  Exams: No results found.  Assessment/Plan Active Problems:   CKD (chronic kidney disease), stage III (HCC)   COPD, severe (HCC)   Acute on chronic respiratory failure with hypoxia (HCC)   Healthcare-associated pneumonia   Chronic heart failure with preserved ejection fraction (HCC)  Acute on chronic respiratory failure with hypoxia currently on ATC 35%.  Patient working towards 20-hour ATC goal today.  Thick secretions remain present.  Continue with aggressive pulmonary toilet. Severe COPD medical management we will continue to follow along Chronic kidney disease stage III following patient's labs Healthcare associated pneumonia has been treated with antibiotics Chronic heart failure preserved ejection fraction monitor fluid status closely   I have personally seen and evaluated the patient, evaluated laboratory and imaging results, formulated the assessment and plan and placed orders. The Patient requires high complexity decision making with multiple systems involvement.  Rounds were done with the Respiratory Therapy Director and Staff therapists and discussed with nursing staff also.  Kenneth Gee, MD [facility] Pulmonary Critical Care Medicine Sleep Medicine

## 2022-01-18 NOTE — Progress Notes (Cosign Needed)
Pulmonary Critical Care Medicine [place]   PULMONARY CRITICAL CARE SERVICE  PROGRESS NOTE     Kenneth Fischer  [id number]  DOB: 07/03/1950   DOA: 12/20/2021  Referring Physician: Satira Sark, MD  HPI: Kenneth Fischer is a 71 y.o. male being followed for ventilator/airway/oxygen weaning Acute on Chronic Respiratory Failure.  Patient seen lying in bed, currently on ATC 35%.  Successfully completed 20-hour ATC goal yesterday, will move forward with a 24-hour ATC goal today.  Medications: Reviewed on Rounds  Physical Exam:  Vitals: Temp 97.7, pulse 60, respirations 20, BP 118/57, SpO2 98%  Ventilator Settings ATC 35%  General: Comfortable at this time Neck: supple Cardiovascular: no malignant arrhythmias Respiratory: Bilaterally diminished Skin: no rash seen on limited exam Musculoskeletal: No gross abnormality Psychiatric:unable to assess Neurologic:no involuntary movements         Lab Data:   Basic Metabolic Panel: Recent Labs  Lab 01/12/22 0610 01/15/22 0227  NA 136 134*  K 4.2 4.8  CL 95* 94*  CO2 34* 31  GLUCOSE 88 99  BUN 27* 32*  CREATININE 0.64 0.60*  CALCIUM 8.7* 8.9  MG  --  2.1    ABG: Recent Labs  Lab 01/13/22 0630  PHART 7.33*  PCO2ART 69*  PO2ART 56*  HCO3 36.6*  O2SAT 92.3    Liver Function Tests: No results for input(s): "AST", "ALT", "ALKPHOS", "BILITOT", "PROT", "ALBUMIN" in the last 168 hours. No results for input(s): "LIPASE", "AMYLASE" in the last 168 hours. No results for input(s): "AMMONIA" in the last 168 hours.  CBC: Recent Labs  Lab 01/12/22 0610 01/15/22 0227  WBC 10.2 9.1  HGB 12.6* 10.7*  HCT 37.9* 31.1*  MCV 89.4 87.1  PLT 233 189    Cardiac Enzymes: No results for input(s): "CKTOTAL", "CKMB", "CKMBINDEX", "TROPONINI" in the last 168 hours.  BNP (last 3 results) No results for input(s): "BNP" in the last 8760 hours.  ProBNP (last 3 results) No results for input(s): "PROBNP" in  the last 8760 hours.  Radiological Exams: No results found.  Assessment/Plan Active Problems:   CKD (chronic kidney disease), stage III (HCC)   COPD, severe (HCC)   Acute on chronic respiratory failure with hypoxia (HCC)   Healthcare-associated pneumonia   Chronic heart failure with preserved ejection fraction (HCC)  Acute on chronic respiratory failure with hypoxia currently on ATC 35%.  Continue with ATC as patient tolerates.  Completed 20-hour ATC goal yesterday, will work towards 24-hour ATC goal today.  Continue with aggressive pulmonary toilet. Severe COPD medical management we will continue to follow along Chronic kidney disease stage III following patient's labs Healthcare associated pneumonia has been treated with antibiotics Chronic heart failure preserved ejection fraction monitor fluid status closely   I have personally seen and evaluated the patient, evaluated laboratory and imaging results, formulated the assessment and plan and placed orders. The Patient requires high complexity decision making with multiple systems involvement.  Rounds were done with the Respiratory Therapy Director and Staff therapists and discussed with nursing staff also.  Allyne Gee, MD [facility] Pulmonary Critical Care Medicine Sleep Medicine

## 2022-01-18 NOTE — Progress Notes (Addendum)
Pulmonary Critical Care Medicine [place]   PULMONARY CRITICAL CARE SERVICE  PROGRESS NOTE     Kenneth Fischer  [id number]:[id number]  DOB: 22-Sep-1950   DOA: 12/20/2021  Referring Physician: Satira Sark, MD  HPI: Kenneth Fischer is a 71 y.o. male being followed for ventilator/airway/oxygen weaning Acute on Chronic Respiratory Failure.  Patient seen lying in bed, currently remains on full support ventilation.  Thick secretions remain present.  Medications: Reviewed on Rounds  Physical Exam:  Vitals: Temp 97.2, pulse 68, respirations 24, BP 105/55, SpO2 99%  Ventilator Settings AC VC, FiO2 35%, tidal volume 500, rate 16, PEEP 5  General: Comfortable at this time Neck: supple Cardiovascular: no malignant arrhythmias Respiratory: Bilaterally diminished Skin: no rash seen on limited exam Musculoskeletal: No gross abnormality Psychiatric:unable to assess Neurologic:no involuntary movements         Lab Data:   Basic Metabolic Panel: Recent Labs  Lab 01/12/22 0610 01/15/22 0227  NA 136 134*  K 4.2 4.8  CL 95* 94*  CO2 34* 31  GLUCOSE 88 99  BUN 27* 32*  CREATININE 0.64 0.60*  CALCIUM 8.7* 8.9  MG  --  2.1    ABG: Recent Labs  Lab 01/13/22 0630  PHART 7.33*  PCO2ART 69*  PO2ART 56*  HCO3 36.6*  O2SAT 92.3    Liver Function Tests: No results for input(s): "AST", "ALT", "ALKPHOS", "BILITOT", "PROT", "ALBUMIN" in the last 168 hours. No results for input(s): "LIPASE", "AMYLASE" in the last 168 hours. No results for input(s): "AMMONIA" in the last 168 hours.  CBC: Recent Labs  Lab 01/12/22 0610 01/15/22 0227  WBC 10.2 9.1  HGB 12.6* 10.7*  HCT 37.9* 31.1*  MCV 89.4 87.1  PLT 233 189    Cardiac Enzymes: No results for input(s): "CKTOTAL", "CKMB", "CKMBINDEX", "TROPONINI" in the last 168 hours.  BNP (last 3 results) No results for input(s): "BNP" in the last 8760 hours.  ProBNP (last 3 results) No results for input(s): "PROBNP" in  the last 8760 hours.  Radiological Exams: No results found.  Assessment/Plan Active Problems:   CKD (chronic kidney disease), stage III (HCC)   COPD, severe (HCC)   Acute on chronic respiratory failure with hypoxia (HCC)   Healthcare-associated pneumonia   Chronic heart failure with preserved ejection fraction (HCC)   Acute on chronic respiratory failure with hypoxia -currently remains on full support ventilation.  Thick secretions remain present.  Continue with aggressive pulmonary toilet. Severe COPD medical management we will continue to follow along Chronic kidney disease stage III following patient's labs Healthcare associated pneumonia has been treated with antibiotics Chronic heart failure preserved ejection fraction monitor fluid status closely   I have personally seen and evaluated the patient, evaluated laboratory and imaging results, formulated the assessment and plan and placed orders. The Patient requires high complexity decision making with multiple systems involvement.  Rounds were done with the Respiratory Therapy Director and Staff therapists and discussed with nursing staff also.  Allyne Gee, MD [facility] Pulmonary Critical Care Medicine Sleep Medicine

## 2022-01-19 ENCOUNTER — Other Ambulatory Visit (HOSPITAL_COMMUNITY): Payer: Medicare Other

## 2022-01-19 DIAGNOSIS — J9621 Acute and chronic respiratory failure with hypoxia: Secondary | ICD-10-CM | POA: Diagnosis not present

## 2022-01-19 DIAGNOSIS — I5032 Chronic diastolic (congestive) heart failure: Secondary | ICD-10-CM | POA: Diagnosis not present

## 2022-01-19 DIAGNOSIS — N1831 Chronic kidney disease, stage 3a: Secondary | ICD-10-CM | POA: Diagnosis not present

## 2022-01-19 DIAGNOSIS — J449 Chronic obstructive pulmonary disease, unspecified: Secondary | ICD-10-CM | POA: Diagnosis not present

## 2022-01-19 LAB — BASIC METABOLIC PANEL
Anion gap: 9 (ref 5–15)
BUN: 39 mg/dL — ABNORMAL HIGH (ref 8–23)
CO2: 32 mmol/L (ref 22–32)
Calcium: 8.6 mg/dL — ABNORMAL LOW (ref 8.9–10.3)
Chloride: 94 mmol/L — ABNORMAL LOW (ref 98–111)
Creatinine, Ser: 0.61 mg/dL (ref 0.61–1.24)
GFR, Estimated: >60 mL/min (ref >60)
Glucose, Bld: 133 mg/dL — ABNORMAL HIGH (ref 70–99)
Potassium: 4.6 mmol/L (ref 3.5–5.1)
Sodium: 135 mmol/L (ref 135–145)

## 2022-01-19 LAB — CBC
HCT: 35 % — ABNORMAL LOW (ref 39.0–52.0)
Hemoglobin: 11.6 g/dL — ABNORMAL LOW (ref 13.0–17.0)
MCH: 29.4 pg (ref 26.0–34.0)
MCHC: 33.1 g/dL (ref 30.0–36.0)
MCV: 88.6 fL (ref 80.0–100.0)
Platelets: 250 10*3/uL (ref 150–400)
RBC: 3.95 MIL/uL — ABNORMAL LOW (ref 4.22–5.81)
RDW: 16.6 % — ABNORMAL HIGH (ref 11.5–15.5)
WBC: 15.6 10*3/uL — ABNORMAL HIGH (ref 4.0–10.5)
nRBC: 0 % (ref 0.0–0.2)

## 2022-01-19 LAB — MAGNESIUM: Magnesium: 2.2 mg/dL (ref 1.7–2.4)

## 2022-01-19 NOTE — Progress Notes (Signed)
Pulmonary Critical Care Medicine [place]   PULMONARY CRITICAL CARE SERVICE  PROGRESS NOTE     Tillman Kazmierski  VQQ:[id number]  DOB: August 26, 1950   DOA: 12/20/2021  Referring Physician: Satira Sark, MD  HPI: Graves Nipp is a 71 y.o. male being followed for ventilator/airway/oxygen weaning Acute on Chronic Respiratory Failure.  Patient is currently on T collar on 40% FiO2 goal is for 24 hours  Medications: Reviewed on Rounds  Physical Exam:  Vitals: Temperature is 97.8 pulse of 80 respiratory rate is 24 blood pressure 105/59 saturation 99%  Ventilator Settings on assist T collar FiO2 is 40%  General: Comfortable at this time Neck: supple Cardiovascular: no malignant arrhythmias Respiratory: Scattered rhonchi expansion is equal Skin: no rash seen on limited exam Musculoskeletal: No gross abnormality Psychiatric:unable to assess Neurologic:no involuntary movements         Lab Data:   Basic Metabolic Panel: Recent Labs  Lab 01/15/22 0227 01/19/22 0227  NA 134* 135  K 4.8 4.6  CL 94* 94*  CO2 31 32  GLUCOSE 99 133*  BUN 32* 39*  CREATININE 0.60* 0.61  CALCIUM 8.9 8.6*  MG 2.1 2.2    ABG: Recent Labs  Lab 01/13/22 0630  PHART 7.33*  PCO2ART 69*  PO2ART 56*  HCO3 36.6*  O2SAT 92.3    Liver Function Tests: No results for input(s): "AST", "ALT", "ALKPHOS", "BILITOT", "PROT", "ALBUMIN" in the last 168 hours. No results for input(s): "LIPASE", "AMYLASE" in the last 168 hours. No results for input(s): "AMMONIA" in the last 168 hours.  CBC: Recent Labs  Lab 01/15/22 0227 01/19/22 0227  WBC 9.1 15.6*  HGB 10.7* 11.6*  HCT 31.1* 35.0*  MCV 87.1 88.6  PLT 189 250    Cardiac Enzymes: No results for input(s): "CKTOTAL", "CKMB", "CKMBINDEX", "TROPONINI" in the last 168 hours.  BNP (last 3 results) No results for input(s): "BNP" in the last 8760 hours.  ProBNP (last 3 results) No results for input(s): "PROBNP" in the last 8760  hours.  Radiological Exams: No results found.  Assessment/Plan Active Problems:   CKD (chronic kidney disease), stage III (HCC)   COPD, severe (HCC)   Acute on chronic respiratory failure with hypoxia (HCC)   Healthcare-associated pneumonia   Chronic heart failure with preserved ejection fraction (HCC)   Acute on chronic respiratory failure with proxy plan is to continue with T collar weaning goal of 24 hours Severe COPD medical management will continue to monitor and follow along Heart failure preserved ejection fraction compensated Healthcare associated pneumonia has been treated CKD stage IIIa monitoring patient's labs   I have personally seen and evaluated the patient, evaluated laboratory and imaging results, formulated the assessment and plan and placed orders. The Patient requires high complexity decision making with multiple systems involvement.  Rounds were done with the Respiratory Therapy Director and Staff therapists and discussed with nursing staff also.  Allyne Gee, MD [facility] Pulmonary Critical Care Medicine Sleep Medicine

## 2022-01-20 DIAGNOSIS — J9621 Acute and chronic respiratory failure with hypoxia: Secondary | ICD-10-CM | POA: Diagnosis not present

## 2022-01-20 DIAGNOSIS — N1831 Chronic kidney disease, stage 3a: Secondary | ICD-10-CM | POA: Diagnosis not present

## 2022-01-20 DIAGNOSIS — I5032 Chronic diastolic (congestive) heart failure: Secondary | ICD-10-CM | POA: Diagnosis not present

## 2022-01-20 DIAGNOSIS — J449 Chronic obstructive pulmonary disease, unspecified: Secondary | ICD-10-CM | POA: Diagnosis not present

## 2022-01-20 NOTE — Progress Notes (Signed)
Pulmonary Critical Care Medicine [place]   PULMONARY CRITICAL CARE SERVICE  PROGRESS NOTE     Kenneth Fischer  [id number]  DOB: Nov 05, 1950   DOA: 12/20/2021  Referring Physician: Satira Sark, MD  HPI: Kenneth Fischer is a 71 y.o. male being followed for ventilator/airway/oxygen weaning Acute on Chronic Respiratory Failure.  Patient at this time is on T collar resting on assist-control mode  Medications: Reviewed on Rounds  Physical Exam:  Vitals: Temperature is 97.3 pulse 73 respiratory 16 blood pressure 84/50 saturation is 100%  Ventilator Settings assist-control FiO2 45% tidal volume 500 PEEP 5  General: Comfortable at this time Neck: supple Cardiovascular: no malignant arrhythmias Respiratory: Scattered rhonchi expansion is equal Skin: no rash seen on limited exam Musculoskeletal: No gross abnormality Psychiatric:unable to assess Neurologic:no involuntary movements         Lab Data:   Basic Metabolic Panel: Recent Labs  Lab 01/15/22 0227 01/19/22 0227  NA 134* 135  K 4.8 4.6  CL 94* 94*  CO2 31 32  GLUCOSE 99 133*  BUN 32* 39*  CREATININE 0.60* 0.61  CALCIUM 8.9 8.6*  MG 2.1 2.2    ABG: No results for input(s): "PHART", "PCO2ART", "PO2ART", "HCO3", "O2SAT" in the last 168 hours.  Liver Function Tests: No results for input(s): "AST", "ALT", "ALKPHOS", "BILITOT", "PROT", "ALBUMIN" in the last 168 hours. No results for input(s): "LIPASE", "AMYLASE" in the last 168 hours. No results for input(s): "AMMONIA" in the last 168 hours.  CBC: Recent Labs  Lab 01/15/22 0227 01/19/22 0227  WBC 9.1 15.6*  HGB 10.7* 11.6*  HCT 31.1* 35.0*  MCV 87.1 88.6  PLT 189 250    Cardiac Enzymes: No results for input(s): "CKTOTAL", "CKMB", "CKMBINDEX", "TROPONINI" in the last 168 hours.  BNP (last 3 results) No results for input(s): "BNP" in the last 8760 hours.  ProBNP (last 3 results) No results for input(s): "PROBNP" in the last  8760 hours.  Radiological Exams: DG Chest Port 1 View  Result Date: 01/19/2022 CLINICAL DATA:  Pneumonia EXAM: PORTABLE CHEST 1 VIEW COMPARISON:  01/11/2022 FINDINGS: Tracheostomy tube. Interval removal of enteric tube. Unchanged cardiac and mediastinal contours. Increased right and new left lower lung opacities. Small right pleural effusion, similar to prior. Possible trace left pleural effusion. No pneumothorax. No acute osseous abnormality. IMPRESSION: 1. Increased right and new left lower lung opacities, concerning for worsening pneumonia. 2. Small right pleural effusion, similar to prior. Possible trace left pleural effusion. Electronically Signed   By: Merilyn Baba M.D.   On: 01/19/2022 13:36    Assessment/Plan Active Problems:   CKD (chronic kidney disease), stage III (HCC)   COPD, severe (HCC)   Acute on chronic respiratory failure with hypoxia (HCC)   Healthcare-associated pneumonia   Chronic heart failure with preserved ejection fraction (HCC)   Acute on chronic respiratory failure with hypoxia on T collar during the daytime plan is to try to advance the weaning as tolerated Severe COPD medical management will continue to follow along Healthcare associated pneumonia has been treated Chronic heart failure with preserved ejection fraction supportive care CKD monitoring patient's labs closely   I have personally seen and evaluated the patient, evaluated laboratory and imaging results, formulated the assessment and plan and placed orders. The Patient requires high complexity decision making with multiple systems involvement.  Rounds were done with the Respiratory Therapy Director and Staff therapists and discussed with nursing staff also.  Allyne Gee, MD [facility] Pulmonary Critical Care Medicine Sleep  Medicine

## 2022-01-21 DIAGNOSIS — I5032 Chronic diastolic (congestive) heart failure: Secondary | ICD-10-CM | POA: Diagnosis not present

## 2022-01-21 DIAGNOSIS — N1831 Chronic kidney disease, stage 3a: Secondary | ICD-10-CM | POA: Diagnosis not present

## 2022-01-21 DIAGNOSIS — J9621 Acute and chronic respiratory failure with hypoxia: Secondary | ICD-10-CM | POA: Diagnosis not present

## 2022-01-21 DIAGNOSIS — J449 Chronic obstructive pulmonary disease, unspecified: Secondary | ICD-10-CM | POA: Diagnosis not present

## 2022-01-21 LAB — BLOOD GAS, ARTERIAL
Acid-Base Excess: 9.4 mmol/L — ABNORMAL HIGH (ref 0.0–2.0)
Bicarbonate: 35.3 mmol/L — ABNORMAL HIGH (ref 20.0–28.0)
FIO2: 50 %
O2 Saturation: 99.2 %
Patient temperature: 36.2
pCO2 arterial: 50 mmHg — ABNORMAL HIGH (ref 32–48)
pH, Arterial: 7.45 (ref 7.35–7.45)
pO2, Arterial: 96 mmHg (ref 83–108)

## 2022-01-21 LAB — VANCOMYCIN, TROUGH: Vancomycin Tr: 13 ug/mL — ABNORMAL LOW (ref 15–20)

## 2022-01-21 NOTE — Progress Notes (Signed)
Pulmonary Critical Care Medicine [place]   PULMONARY CRITICAL CARE SERVICE  PROGRESS NOTE     Kenneth Fischer  QIW:[id number]  DOB: September 24, 1950   DOA: 12/20/2021  Referring Physician: Satira Sark, MD  HPI: Kenneth Fischer is a 71 y.o. male being followed for ventilator/airway/oxygen weaning Acute on Chronic Respiratory Failure.  Patient currently is on T collar has been on 50% FiO2 the goal is to go for 24 hours  Medications: Reviewed on Rounds  Physical Exam:  Vitals: Temperature is 97.8 pulse 69 respiratory 16 blood pressure 110/60 saturation 96%  Ventilator Settings on T collar FiO2 50%  General: Comfortable at this time Neck: supple Cardiovascular: no malignant arrhythmias Respiratory: Scattered coarse rhonchi Skin: no rash seen on limited exam Musculoskeletal: No gross abnormality Psychiatric:unable to assess Neurologic:no involuntary movements         Lab Data:   Basic Metabolic Panel: Recent Labs  Lab 01/15/22 0227 01/19/22 0227  NA 134* 135  K 4.8 4.6  CL 94* 94*  CO2 31 32  GLUCOSE 99 133*  BUN 32* 39*  CREATININE 0.60* 0.61  CALCIUM 8.9 8.6*  MG 2.1 2.2    ABG: No results for input(s): "PHART", "PCO2ART", "PO2ART", "HCO3", "O2SAT" in the last 168 hours.  Liver Function Tests: No results for input(s): "AST", "ALT", "ALKPHOS", "BILITOT", "PROT", "ALBUMIN" in the last 168 hours. No results for input(s): "LIPASE", "AMYLASE" in the last 168 hours. No results for input(s): "AMMONIA" in the last 168 hours.  CBC: Recent Labs  Lab 01/15/22 0227 01/19/22 0227  WBC 9.1 15.6*  HGB 10.7* 11.6*  HCT 31.1* 35.0*  MCV 87.1 88.6  PLT 189 250    Cardiac Enzymes: No results for input(s): "CKTOTAL", "CKMB", "CKMBINDEX", "TROPONINI" in the last 168 hours.  BNP (last 3 results) No results for input(s): "BNP" in the last 8760 hours.  ProBNP (last 3 results) No results for input(s): "PROBNP" in the last 8760 hours.  Radiological  Exams: DG Chest Port 1 View  Result Date: 01/19/2022 CLINICAL DATA:  Pneumonia EXAM: PORTABLE CHEST 1 VIEW COMPARISON:  01/11/2022 FINDINGS: Tracheostomy tube. Interval removal of enteric tube. Unchanged cardiac and mediastinal contours. Increased right and new left lower lung opacities. Small right pleural effusion, similar to prior. Possible trace left pleural effusion. No pneumothorax. No acute osseous abnormality. IMPRESSION: 1. Increased right and new left lower lung opacities, concerning for worsening pneumonia. 2. Small right pleural effusion, similar to prior. Possible trace left pleural effusion. Electronically Signed   By: Merilyn Baba M.D.   On: 01/19/2022 13:36    Assessment/Plan Active Problems:   CKD (chronic kidney disease), stage III (HCC)   COPD, severe (HCC)   Acute on chronic respiratory failure with hypoxia (HCC)   Healthcare-associated pneumonia   Chronic heart failure with preserved ejection fraction (HCC)   Acute on chronic respiratory failure hypoxia will continue with the T-piece goal of 24 hours Severe COPD medical management CKD stage III supportive care monitoring labs Healthcare associated pneumonia has been treated with antibiotics Chronic heart failure preserved ejection fraction patient is at baseline   I have personally seen and evaluated the patient, evaluated laboratory and imaging results, formulated the assessment and plan and placed orders. The Patient requires high complexity decision making with multiple systems involvement.  Rounds were done with the Respiratory Therapy Director and Staff therapists and discussed with nursing staff also.  Allyne Gee, MD [facility] Pulmonary Critical Care Medicine Sleep Medicine

## 2022-01-22 DIAGNOSIS — I5032 Chronic diastolic (congestive) heart failure: Secondary | ICD-10-CM | POA: Diagnosis not present

## 2022-01-22 DIAGNOSIS — N1831 Chronic kidney disease, stage 3a: Secondary | ICD-10-CM | POA: Diagnosis not present

## 2022-01-22 DIAGNOSIS — J9621 Acute and chronic respiratory failure with hypoxia: Secondary | ICD-10-CM | POA: Diagnosis not present

## 2022-01-22 DIAGNOSIS — J449 Chronic obstructive pulmonary disease, unspecified: Secondary | ICD-10-CM | POA: Diagnosis not present

## 2022-01-22 LAB — CBC
HCT: 33.1 % — ABNORMAL LOW (ref 39.0–52.0)
Hemoglobin: 10.7 g/dL — ABNORMAL LOW (ref 13.0–17.0)
MCH: 29.3 pg (ref 26.0–34.0)
MCHC: 32.3 g/dL (ref 30.0–36.0)
MCV: 90.7 fL (ref 80.0–100.0)
Platelets: 246 K/uL (ref 150–400)
RBC: 3.65 MIL/uL — ABNORMAL LOW (ref 4.22–5.81)
RDW: 16.3 % — ABNORMAL HIGH (ref 11.5–15.5)
WBC: 10.5 K/uL (ref 4.0–10.5)
nRBC: 0.2 % (ref 0.0–0.2)

## 2022-01-22 LAB — BASIC METABOLIC PANEL
Anion gap: 7 (ref 5–15)
BUN: 33 mg/dL — ABNORMAL HIGH (ref 8–23)
CO2: 32 mmol/L (ref 22–32)
Calcium: 8.8 mg/dL — ABNORMAL LOW (ref 8.9–10.3)
Chloride: 97 mmol/L — ABNORMAL LOW (ref 98–111)
Creatinine, Ser: 0.54 mg/dL — ABNORMAL LOW (ref 0.61–1.24)
GFR, Estimated: >60 mL/min (ref >60)
Glucose, Bld: 126 mg/dL — ABNORMAL HIGH (ref 70–99)
Potassium: 5.1 mmol/L (ref 3.5–5.1)
Sodium: 136 mmol/L (ref 135–145)

## 2022-01-22 LAB — MAGNESIUM: Magnesium: 2.3 mg/dL (ref 1.7–2.4)

## 2022-01-22 LAB — CULTURE, RESPIRATORY W GRAM STAIN

## 2022-01-22 LAB — POTASSIUM: Potassium: 5.3 mmol/L — ABNORMAL HIGH (ref 3.5–5.1)

## 2022-01-22 NOTE — Progress Notes (Signed)
Pulmonary Critical Care Medicine [place]   PULMONARY CRITICAL CARE SERVICE  PROGRESS NOTE     Brylin Stanislawski  IWL:[id number]  DOB: 1950-03-20   DOA: 12/20/2021  Referring Physician: Satira Sark, MD  HPI: Ines Warf is a 71 y.o. male being followed for ventilator/airway/oxygen weaning Acute on Chronic Respiratory Failure.  On T collar currently on 50% FiO2  Medications: Reviewed on Rounds  Physical Exam:  Vitals: Temperature is 97.3 pulse 63 respiratory rate 18 blood pressure 138/76 saturation 97%  Ventilator Settings off the ventilator on T collar FiO2 is 50%  General: Comfortable at this time Neck: supple Cardiovascular: no malignant arrhythmias Respiratory: Scattered rhonchi expansion equal Skin: no rash seen on limited exam Musculoskeletal: No gross abnormality Psychiatric:unable to assess Neurologic:no involuntary movements         Lab Data:   Basic Metabolic Panel: Recent Labs  Lab 01/19/22 0227 01/22/22 0112  NA 135 136  K 4.6 5.1  CL 94* 97*  CO2 32 32  GLUCOSE 133* 126*  BUN 39* 33*  CREATININE 0.61 0.54*  CALCIUM 8.6* 8.8*  MG 2.2 2.3    ABG: Recent Labs  Lab 01/21/22 1028  PHART 7.45  PCO2ART 50*  PO2ART 96  HCO3 35.3*  O2SAT 99.2    Liver Function Tests: No results for input(s): "AST", "ALT", "ALKPHOS", "BILITOT", "PROT", "ALBUMIN" in the last 168 hours. No results for input(s): "LIPASE", "AMYLASE" in the last 168 hours. No results for input(s): "AMMONIA" in the last 168 hours.  CBC: Recent Labs  Lab 01/19/22 0227 01/22/22 0112  WBC 15.6* 10.5  HGB 11.6* 10.7*  HCT 35.0* 33.1*  MCV 88.6 90.7  PLT 250 246    Cardiac Enzymes: No results for input(s): "CKTOTAL", "CKMB", "CKMBINDEX", "TROPONINI" in the last 168 hours.  BNP (last 3 results) No results for input(s): "BNP" in the last 8760 hours.  ProBNP (last 3 results) No results for input(s): "PROBNP" in the last 8760 hours.  Radiological  Exams: No results found.  Assessment/Plan Active Problems:   CKD (chronic kidney disease), stage III (HCC)   COPD, severe (HCC)   Acute on chronic respiratory failure with hypoxia (HCC)   Healthcare-associated pneumonia   Chronic heart failure with preserved ejection fraction (HCC)   Acute on chronic respiratory failure hypoxia plan is to continue with T-piece titrate oxygen as tolerated continue pulmonary toilet Severe COPD medical management will continue to follow along CKD stage III supportive care Healthcare associated pneumonia treated with antibiotics Chronic heart failure with preserved ejection fraction appears to be compensated   I have personally seen and evaluated the patient, evaluated laboratory and imaging results, formulated the assessment and plan and placed orders. The Patient requires high complexity decision making with multiple systems involvement.  Rounds were done with the Respiratory Therapy Director and Staff therapists and discussed with nursing staff also.  Allyne Gee, MD [facility] Pulmonary Critical Care Medicine Sleep Medicine

## 2022-01-23 DIAGNOSIS — I5032 Chronic diastolic (congestive) heart failure: Secondary | ICD-10-CM | POA: Diagnosis not present

## 2022-01-23 DIAGNOSIS — J449 Chronic obstructive pulmonary disease, unspecified: Secondary | ICD-10-CM | POA: Diagnosis not present

## 2022-01-23 DIAGNOSIS — J9621 Acute and chronic respiratory failure with hypoxia: Secondary | ICD-10-CM | POA: Diagnosis not present

## 2022-01-23 DIAGNOSIS — N1831 Chronic kidney disease, stage 3a: Secondary | ICD-10-CM | POA: Diagnosis not present

## 2022-01-23 LAB — VANCOMYCIN, TROUGH: Vancomycin Tr: 18 ug/mL (ref 15–20)

## 2022-01-23 LAB — POTASSIUM: Potassium: 4.4 mmol/L (ref 3.5–5.1)

## 2022-01-23 NOTE — Progress Notes (Signed)
Pulmonary Critical Care Medicine [facility] [place]   PULMONARY CRITICAL CARE SERVICE  PROGRESS NOTE     Kenneth Fischer  [id number]  DOB: 07/26/1950   DOA: 12/20/2021  Referring Physician: Luna Kitchens, MD  HPI: Kenneth Fischer is a 71 y.o. male being followed for ventilator/airway/oxygen weaning Acute on Chronic Respiratory Failure.  Patient is on T collar 50% FiO2 will be completing 72 hours  Medications: Reviewed on Rounds  Physical Exam:  Vitals: Temperature is 96.9 pulse of 72 respiratory rate 20 blood pressure 151/82 saturation 97%  Ventilator Settings off the ventilator on T collar FiO2 50%  General: Comfortable at this time Neck: supple Cardiovascular: no malignant arrhythmias Respiratory: No rhonchi no rales are noted Skin: no rash seen on limited exam Musculoskeletal: No gross abnormality Psychiatric:unable to assess Neurologic:no involuntary movements         Lab Data:   Basic Metabolic Panel: Recent Labs  Lab 01/19/22 0227 01/22/22 0112 01/22/22 1957 01/23/22 0548  NA 135 136  --   --   K 4.6 5.1 5.3* 4.4  CL 94* 97*  --   --   CO2 32 32  --   --   GLUCOSE 133* 126*  --   --   BUN 39* 33*  --   --   CREATININE 0.61 0.54*  --   --   CALCIUM 8.6* 8.8*  --   --   MG 2.2 2.3  --   --     ABG: Recent Labs  Lab 01/21/22 1028  PHART 7.45  PCO2ART 50*  PO2ART 96  HCO3 35.3*  O2SAT 99.2    Liver Function Tests: No results for input(s): "AST", "ALT", "ALKPHOS", "BILITOT", "PROT", "ALBUMIN" in the last 168 hours. No results for input(s): "LIPASE", "AMYLASE" in the last 168 hours. No results for input(s): "AMMONIA" in the last 168 hours.  CBC: Recent Labs  Lab 01/19/22 0227 01/22/22 0112  WBC 15.6* 10.5  HGB 11.6* 10.7*  HCT 35.0* 33.1*  MCV 88.6 90.7  PLT 250 246    Cardiac Enzymes: No results for input(s): "CKTOTAL", "CKMB", "CKMBINDEX", "TROPONINI" in the last 168 hours.  BNP (last 3 results) No results for  input(s): "BNP" in the last 8760 hours.  ProBNP (last 3 results) No results for input(s): "PROBNP" in the last 8760 hours.  Radiological Exams: No results found.  Assessment/Plan Active Problems:   CKD (chronic kidney disease), stage III (HCC)   COPD, severe (HCC)   Acute on chronic respiratory failure with hypoxia (HCC)   Healthcare-associated pneumonia   Chronic heart failure with preserved ejection fraction (HCC)   Acute on chronic respiratory failure hypoxia will continue with T collar trials For COPD medical management CKD stage III supportive care monitor outpatient lab work Healthcare associated pneumonia has been treated we will continue to monitor Chronic heart failure preserved ejection fraction at baseline   I have personally seen and evaluated the patient, evaluated laboratory and imaging results, formulated the assessment and plan and placed orders. The Patient requires high complexity decision making with multiple systems involvement.  Rounds were done with the Respiratory Therapy Director and Staff therapists and discussed with nursing staff also.  Yevonne Pax, MD [facility] Pulmonary Critical Care Medicine Sleep Medicine

## 2022-01-24 DIAGNOSIS — N1831 Chronic kidney disease, stage 3a: Secondary | ICD-10-CM | POA: Diagnosis not present

## 2022-01-24 DIAGNOSIS — I5032 Chronic diastolic (congestive) heart failure: Secondary | ICD-10-CM | POA: Diagnosis not present

## 2022-01-24 DIAGNOSIS — J449 Chronic obstructive pulmonary disease, unspecified: Secondary | ICD-10-CM | POA: Diagnosis not present

## 2022-01-24 DIAGNOSIS — J9621 Acute and chronic respiratory failure with hypoxia: Secondary | ICD-10-CM | POA: Diagnosis not present

## 2022-01-24 NOTE — Progress Notes (Signed)
Pulmonary Critical Care Medicine [place]   PULMONARY CRITICAL CARE SERVICE  PROGRESS NOTE     Kenneth Fischer  [id number]  DOB: 1950-02-16   DOA: 12/20/2021  Referring Physician: Satira Sark, MD  HPI: Kenneth Fischer is a 71 y.o. male being followed for ventilator/airway/oxygen weaning Acute on Chronic Respiratory Failure.  Patient is on T collar resting comfortably without distress  Medications: Reviewed on Rounds  Physical Exam:  Vitals: Temperature 97.8 pulse 74 respiratory rate is 30 blood pressure 118/65 saturations 97%  Ventilator Settings on T collar  General: Comfortable at this time Neck: supple Cardiovascular: no malignant arrhythmias Respiratory: No rhonchi no rales Skin: no rash seen on limited exam Musculoskeletal: No gross abnormality Psychiatric:unable to assess Neurologic:no involuntary movements         Lab Data:   Basic Metabolic Panel: Recent Labs  Lab 01/19/22 0227 01/22/22 0112 01/22/22 1957 01/23/22 0548  NA 135 136  --   --   K 4.6 5.1 5.3* 4.4  CL 94* 97*  --   --   CO2 32 32  --   --   GLUCOSE 133* 126*  --   --   BUN 39* 33*  --   --   CREATININE 0.61 0.54*  --   --   CALCIUM 8.6* 8.8*  --   --   MG 2.2 2.3  --   --     ABG: Recent Labs  Lab 01/21/22 1028  PHART 7.45  PCO2ART 50*  PO2ART 96  HCO3 35.3*  O2SAT 99.2    Liver Function Tests: No results for input(s): "AST", "ALT", "ALKPHOS", "BILITOT", "PROT", "ALBUMIN" in the last 168 hours. No results for input(s): "LIPASE", "AMYLASE" in the last 168 hours. No results for input(s): "AMMONIA" in the last 168 hours.  CBC: Recent Labs  Lab 01/19/22 0227 01/22/22 0112  WBC 15.6* 10.5  HGB 11.6* 10.7*  HCT 35.0* 33.1*  MCV 88.6 90.7  PLT 250 246    Cardiac Enzymes: No results for input(s): "CKTOTAL", "CKMB", "CKMBINDEX", "TROPONINI" in the last 168 hours.  BNP (last 3 results) No results for input(s): "BNP" in the last 8760  hours.  ProBNP (last 3 results) No results for input(s): "PROBNP" in the last 8760 hours.  Radiological Exams: No results found.  Assessment/Plan Active Problems:   CKD (chronic kidney disease), stage III (HCC)   COPD, severe (HCC)   Acute on chronic respiratory failure with hypoxia (HCC)   Healthcare-associated pneumonia   Chronic heart failure with preserved ejection fraction (HCC)   Acute on chronic respiratory failure with hypoxia patient has done well with weaning we will go ahead and change over to a cuffless trach CKD: Patient's labs continue to monitor Healthcare associated pneumonia has been treated with antibiotics Severe COPD medical management Chronic heart failure preserved ejection fraction no change   I have personally seen and evaluated the patient, evaluated laboratory and imaging results, formulated the assessment and plan and placed orders. The Patient requires high complexity decision making with multiple systems involvement.  Rounds were done with the Respiratory Therapy Director and Staff therapists and discussed with nursing staff also.  Allyne Gee, MD [facility] Pulmonary Critical Care Medicine Sleep Medicine

## 2022-01-25 DIAGNOSIS — I5032 Chronic diastolic (congestive) heart failure: Secondary | ICD-10-CM | POA: Diagnosis not present

## 2022-01-25 DIAGNOSIS — N1831 Chronic kidney disease, stage 3a: Secondary | ICD-10-CM | POA: Diagnosis not present

## 2022-01-25 DIAGNOSIS — J449 Chronic obstructive pulmonary disease, unspecified: Secondary | ICD-10-CM | POA: Diagnosis not present

## 2022-01-25 DIAGNOSIS — J9621 Acute and chronic respiratory failure with hypoxia: Secondary | ICD-10-CM | POA: Diagnosis not present

## 2022-01-25 LAB — CBC
HCT: 33.6 % — ABNORMAL LOW (ref 39.0–52.0)
Hemoglobin: 10.8 g/dL — ABNORMAL LOW (ref 13.0–17.0)
MCH: 29.8 pg (ref 26.0–34.0)
MCHC: 32.1 g/dL (ref 30.0–36.0)
MCV: 92.6 fL (ref 80.0–100.0)
Platelets: 274 10*3/uL (ref 150–400)
RBC: 3.63 MIL/uL — ABNORMAL LOW (ref 4.22–5.81)
RDW: 16.5 % — ABNORMAL HIGH (ref 11.5–15.5)
WBC: 10.8 10*3/uL — ABNORMAL HIGH (ref 4.0–10.5)
nRBC: 0 % (ref 0.0–0.2)

## 2022-01-25 LAB — BASIC METABOLIC PANEL
Anion gap: 6 (ref 5–15)
BUN: 30 mg/dL — ABNORMAL HIGH (ref 8–23)
CO2: 34 mmol/L — ABNORMAL HIGH (ref 22–32)
Calcium: 8.8 mg/dL — ABNORMAL LOW (ref 8.9–10.3)
Chloride: 96 mmol/L — ABNORMAL LOW (ref 98–111)
Creatinine, Ser: 0.48 mg/dL — ABNORMAL LOW (ref 0.61–1.24)
GFR, Estimated: >60 mL/min (ref >60)
Glucose, Bld: 108 mg/dL — ABNORMAL HIGH (ref 70–99)
Potassium: 5.1 mmol/L (ref 3.5–5.1)
Sodium: 136 mmol/L (ref 135–145)

## 2022-01-25 LAB — MAGNESIUM: Magnesium: 2.1 mg/dL (ref 1.7–2.4)

## 2022-01-25 NOTE — Progress Notes (Signed)
Pulmonary Critical Care Medicine [place]   PULMONARY CRITICAL CARE SERVICE  PROGRESS NOTE     Kenneth Fischer  [id number]  DOB: 04-Jan-1951   DOA: 12/20/2021  Referring Physician: Satira Sark, MD  HPI: Kenneth Fischer is a 71 y.o. male being followed for ventilator/airway/oxygen weaning Acute on Chronic Respiratory Failure.  Patient is on T collar currently on 40% FiO2 has been tolerating PMV well  Medications: Reviewed on Rounds  Physical Exam:  Vitals: Temperature 97.1 pulse 83 respiratory 25 blood pressure 134/69 saturation is 97%  Ventilator Settings T collar FiO2 40%  General: Comfortable at this time Neck: supple Cardiovascular: no malignant arrhythmias Respiratory: No rhonchi very coarse breath sounds Skin: no rash seen on limited exam Musculoskeletal: No gross abnormality Psychiatric:unable to assess Neurologic:no involuntary movements         Lab Data:   Basic Metabolic Panel: Recent Labs  Lab 01/19/22 0227 01/22/22 0112 01/22/22 1957 01/23/22 0548 01/25/22 0137  NA 135 136  --   --  136  K 4.6 5.1 5.3* 4.4 5.1  CL 94* 97*  --   --  96*  CO2 32 32  --   --  34*  GLUCOSE 133* 126*  --   --  108*  BUN 39* 33*  --   --  30*  CREATININE 0.61 0.54*  --   --  0.48*  CALCIUM 8.6* 8.8*  --   --  8.8*  MG 2.2 2.3  --   --  2.1    ABG: Recent Labs  Lab 01/21/22 1028  PHART 7.45  PCO2ART 50*  PO2ART 96  HCO3 35.3*  O2SAT 99.2    Liver Function Tests: No results for input(s): "AST", "ALT", "ALKPHOS", "BILITOT", "PROT", "ALBUMIN" in the last 168 hours. No results for input(s): "LIPASE", "AMYLASE" in the last 168 hours. No results for input(s): "AMMONIA" in the last 168 hours.  CBC: Recent Labs  Lab 01/19/22 0227 01/22/22 0112 01/25/22 0137  WBC 15.6* 10.5 10.8*  HGB 11.6* 10.7* 10.8*  HCT 35.0* 33.1* 33.6*  MCV 88.6 90.7 92.6  PLT 250 246 274    Cardiac Enzymes: No results for input(s): "CKTOTAL", "CKMB",  "CKMBINDEX", "TROPONINI" in the last 168 hours.  BNP (last 3 results) No results for input(s): "BNP" in the last 8760 hours.  ProBNP (last 3 results) No results for input(s): "PROBNP" in the last 8760 hours.  Radiological Exams: No results found.  Assessment/Plan Active Problems:   CKD (chronic kidney disease), stage III (HCC)   COPD, severe (HCC)   Acute on chronic respiratory failure with hypoxia (HCC)   Healthcare-associated pneumonia   Chronic heart failure with preserved ejection fraction (HCC)   Acute on chronic respiratory failure hypoxia will continue with the T collar patient is doing well with PMV also will continue pulmonary toilet CKD stage III supportive care will continue to monitor Severe COPD medical management inhalers or nebulizers Healthcare associated pneumonia has been treated with antibiotics Chronic heart failure preserved ejection fraction supportive care   I have personally seen and evaluated the patient, evaluated laboratory and imaging results, formulated the assessment and plan and placed orders. The Patient requires high complexity decision making with multiple systems involvement.  Rounds were done with the Respiratory Therapy Director and Staff therapists and discussed with nursing staff also.  Allyne Gee, MD [facility] Pulmonary Critical Care Medicine Sleep Medicine

## 2022-01-26 ENCOUNTER — Other Ambulatory Visit (HOSPITAL_COMMUNITY): Payer: Medicare Other

## 2022-01-26 DIAGNOSIS — J449 Chronic obstructive pulmonary disease, unspecified: Secondary | ICD-10-CM | POA: Diagnosis not present

## 2022-01-26 DIAGNOSIS — I5032 Chronic diastolic (congestive) heart failure: Secondary | ICD-10-CM | POA: Diagnosis not present

## 2022-01-26 DIAGNOSIS — N1831 Chronic kidney disease, stage 3a: Secondary | ICD-10-CM | POA: Diagnosis not present

## 2022-01-26 DIAGNOSIS — J9621 Acute and chronic respiratory failure with hypoxia: Secondary | ICD-10-CM | POA: Diagnosis not present

## 2022-01-26 LAB — POTASSIUM: Potassium: 4.8 mmol/L (ref 3.5–5.1)

## 2022-01-26 MED ORDER — IOHEXOL 350 MG/ML SOLN
50.0000 mL | Freq: Once | INTRAVENOUS | Status: AC | PRN
  Administered 2022-01-26: 50 mL via INTRAVENOUS

## 2022-01-27 DIAGNOSIS — I5032 Chronic diastolic (congestive) heart failure: Secondary | ICD-10-CM | POA: Diagnosis not present

## 2022-01-27 DIAGNOSIS — J9621 Acute and chronic respiratory failure with hypoxia: Secondary | ICD-10-CM | POA: Diagnosis not present

## 2022-01-27 DIAGNOSIS — J449 Chronic obstructive pulmonary disease, unspecified: Secondary | ICD-10-CM | POA: Diagnosis not present

## 2022-01-27 DIAGNOSIS — N1831 Chronic kidney disease, stage 3a: Secondary | ICD-10-CM | POA: Diagnosis not present

## 2022-01-27 NOTE — Progress Notes (Incomplete)
Pulmonary Critical Care Medicine [place]   PULMONARY CRITICAL CARE SERVICE  PROGRESS NOTE     Kenneth Fischer  [id number]  DOB: [birth year]   DOA: 12/20/2021  Referring Physician: Satira Sark, MD  HPI: Kenneth Fischer is a 71 y.o. male being followed for ventilator/airway/oxygen weaning Acute on Chronic Respiratory Failure. ***  Medications: Reviewed on Rounds  Physical Exam:  Vitals: ***  Ventilator Settings ***  General: Comfortable at this time Neck: supple Cardiovascular: no malignant arrhythmias Respiratory: *** Skin: no rash seen on limited exam Musculoskeletal: No gross abnormality Psychiatric:unable to assess Neurologic:no involuntary movements         Lab Data:   Basic Metabolic Panel: Recent Labs  Lab 01/22/22 0112 01/22/22 1957 01/23/22 0548 01/25/22 0137 01/26/22 1752  NA 136  --   --  136  --   K 5.1 5.3* 4.4 5.1 4.8  CL 97*  --   --  96*  --   CO2 32  --   --  34*  --   GLUCOSE 126*  --   --  108*  --   BUN 33*  --   --  30*  --   CREATININE 0.54*  --   --  0.48*  --   CALCIUM 8.8*  --   --  8.8*  --   MG 2.3  --   --  2.1  --     ABG: Recent Labs  Lab 01/21/22 1028  PHART 7.45  PCO2ART 50*  PO2ART 96  HCO3 35.3*  O2SAT 99.2    Liver Function Tests: No results for input(s): "AST", "ALT", "ALKPHOS", "BILITOT", "PROT", "ALBUMIN" in the last 168 hours. No results for input(s): "LIPASE", "AMYLASE" in the last 168 hours. No results for input(s): "AMMONIA" in the last 168 hours.  CBC: Recent Labs  Lab 01/22/22 0112 01/25/22 0137  WBC 10.5 10.8*  HGB 10.7* 10.8*  HCT 33.1* 33.6*  MCV 90.7 92.6  PLT 246 274    Cardiac Enzymes: No results for input(s): "CKTOTAL", "CKMB", "CKMBINDEX", "TROPONINI" in the last 168 hours.  BNP (last 3 results) No results for input(s): "BNP" in the last 8760 hours.  ProBNP (last 3 results) No results for input(s): "PROBNP" in the last 8760 hours.  Radiological  Exams: CT CHEST W CONTRAST  Result Date: 01/26/2022 CLINICAL DATA:  Pneumonia suspected.  Uncomplicated. EXAM: CT CHEST WITH CONTRAST TECHNIQUE: Multidetector CT imaging of the chest was performed during intravenous contrast administration. RADIATION DOSE REDUCTION: This exam was performed according to the departmental dose-optimization program which includes automated exposure control, adjustment of the mA and/or kV according to patient size and/or use of iterative reconstruction technique. CONTRAST:  17m OMNIPAQUE IOHEXOL 350 MG/ML SOLN COMPARISON:  Chest radiograph earlier today.  Chest CT 12/28/2021 FINDINGS: Degradation secondary to motion and patient arm position, not raised above the head. Cardiovascular: Aortic atherosclerosis. Moderate cardiomegaly, without pericardial effusion. Multivessel coronary artery atherosclerosis. No central pulmonary embolism, on this non-dedicated study. Pulmonary artery enlargement, outflow tract 3.1 cm Mediastinum/Nodes: calcified mediastinal and hilar nodes are likely related to old granulomatous disease. Lungs/Pleura: Similar multiloculated small to moderate right-sided pleural effusion. A small left-sided pleural effusion is not significantly changed. Tracheostomy. Advanced centrilobular emphysema. Slightly worsened left base primarily dependent airspace disease. Increased posterior right upper lobe consolidation with air bronchograms. Similar dependent right lower lobe airspace disease. Patchy peribronchovascular areas of consolidation within the left upper lobe are felt to be similar. Upper Abdomen: Normal imaged portions of the  liver, pancreas. Gastrostomy tube. An incompletely imaged medial splenic lesion of 1.4 cm is likely a cyst or lymphangioma. Bilateral adrenal thickening and nodularity with maintenance of adreniform shape, favoring small adenomas. Musculoskeletal: Mild osteopenia.  Remote left rib fractures. IMPRESSION: 1. Mildly degraded exam as detailed  above. 2. Chronic right larger than left pleural effusions with extensive right-sided loculation. 3. Multifocal pulmonary opacities, including progressive left lower and right upper lobe consolidation. Favor primarily infection. At the right lung base, atelectasis is favored. 4. Aortic atherosclerosis (ICD10-I70.0), coronary artery atherosclerosis and emphysema (ICD10-J43.9). 5. Pulmonary artery enlargement suggests pulmonary arterial hypertension. Electronically Signed   By: Abigail Miyamoto M.D.   On: 01/26/2022 14:56   DG Chest Port 1 View  Result Date: 01/26/2022 CLINICAL DATA:  Hypoxia. EXAM: PORTABLE CHEST 1 VIEW COMPARISON:  January 19, 2022.  December 28, 2021. FINDINGS: Stable cardiomediastinal silhouette. Tracheostomy tube is in grossly good position. Increased right upper lobe opacity is noted concerning for worsening atelectasis or infiltrate with possible loculated pleural effusion. Stable bibasilar opacities are noted, left greater than right, concerning for atelectasis or infiltrates and associated effusions. Bony thorax is unremarkable. IMPRESSION: Increased right upper lobe opacity is noted concerning for worsening atelectasis or infiltrate with possible loculated right apical effusion. Stable bibasilar opacities as described above. Electronically Signed   By: Marijo Conception M.D.   On: 01/26/2022 10:55    Assessment/Plan Active Problems:   CKD (chronic kidney disease), stage III (HCC)   COPD, severe (HCC)   Acute on chronic respiratory failure with hypoxia (HCC)   Healthcare-associated pneumonia   Chronic heart failure with preserved ejection fraction (HCC)  Acute on chronic respiratory failure hypoxia will continue with the T collar patient is doing well with PMV also will continue pulmonary toilet CKD stage III supportive care will continue to monitor Severe COPD medical management inhalers or nebulizers Healthcare associated pneumonia has been treated with antibiotics Chronic  heart failure preserved ejection fraction supportive care     I have personally seen and evaluated the patient, evaluated laboratory and imaging results, formulated the assessment and plan and placed orders. The Patient requires high complexity decision making with multiple systems involvement.  Rounds were done with the Respiratory Therapy Director and Staff therapists and discussed with nursing staff also.  Allyne Gee, MD [facility] Pulmonary Critical Care Medicine Sleep Medicine

## 2022-01-27 NOTE — Progress Notes (Incomplete)
Pulmonary Critical Care Medicine [place]   PULMONARY CRITICAL CARE SERVICE  PROGRESS NOTE     Daeshaun Specht  WGN:[id number]  DOB: Jan 17, 1951   DOA: 12/20/2021  Referring Physician: Satira Sark, MD  HPI: Kenneth Fischer is a 71 y.o. male being followed for ventilator/airway/oxygen weaning Acute on Chronic Respiratory Failure. ***  Medications: Reviewed on Rounds  Physical Exam:  Vitals: ***  Ventilator Settings ***  General: Comfortable at this time Neck: supple Cardiovascular: no malignant arrhythmias Respiratory: *** Skin: no rash seen on limited exam Musculoskeletal: No gross abnormality Psychiatric:unable to assess Neurologic:no involuntary movements         Lab Data:   Basic Metabolic Panel: Recent Labs  Lab 01/22/22 0112 01/22/22 1957 01/23/22 0548 01/25/22 0137 01/26/22 1752  NA 136  --   --  136  --   K 5.1 5.3* 4.4 5.1 4.8  CL 97*  --   --  96*  --   CO2 32  --   --  34*  --   GLUCOSE 126*  --   --  108*  --   BUN 33*  --   --  30*  --   CREATININE 0.54*  --   --  0.48*  --   CALCIUM 8.8*  --   --  8.8*  --   MG 2.3  --   --  2.1  --     ABG: Recent Labs  Lab 01/21/22 1028  PHART 7.45  PCO2ART 50*  PO2ART 96  HCO3 35.3*  O2SAT 99.2    Liver Function Tests: No results for input(s): "AST", "ALT", "ALKPHOS", "BILITOT", "PROT", "ALBUMIN" in the last 168 hours. No results for input(s): "LIPASE", "AMYLASE" in the last 168 hours. No results for input(s): "AMMONIA" in the last 168 hours.  CBC: Recent Labs  Lab 01/22/22 0112 01/25/22 0137  WBC 10.5 10.8*  HGB 10.7* 10.8*  HCT 33.1* 33.6*  MCV 90.7 92.6  PLT 246 274    Cardiac Enzymes: No results for input(s): "CKTOTAL", "CKMB", "CKMBINDEX", "TROPONINI" in the last 168 hours.  BNP (last 3 results) No results for input(s): "BNP" in the last 8760 hours.  ProBNP (last 3 results) No results for input(s): "PROBNP" in the last 8760 hours.  Radiological  Exams: CT CHEST W CONTRAST  Result Date: 01/26/2022 CLINICAL DATA:  Pneumonia suspected.  Uncomplicated. EXAM: CT CHEST WITH CONTRAST TECHNIQUE: Multidetector CT imaging of the chest was performed during intravenous contrast administration. RADIATION DOSE REDUCTION: This exam was performed according to the departmental dose-optimization program which includes automated exposure control, adjustment of the mA and/or kV according to patient size and/or use of iterative reconstruction technique. CONTRAST:  57m OMNIPAQUE IOHEXOL 350 MG/ML SOLN COMPARISON:  Chest radiograph earlier today.  Chest CT 12/28/2021 FINDINGS: Degradation secondary to motion and patient arm position, not raised above the head. Cardiovascular: Aortic atherosclerosis. Moderate cardiomegaly, without pericardial effusion. Multivessel coronary artery atherosclerosis. No central pulmonary embolism, on this non-dedicated study. Pulmonary artery enlargement, outflow tract 3.1 cm Mediastinum/Nodes: calcified mediastinal and hilar nodes are likely related to old granulomatous disease. Lungs/Pleura: Similar multiloculated small to moderate right-sided pleural effusion. A small left-sided pleural effusion is not significantly changed. Tracheostomy. Advanced centrilobular emphysema. Slightly worsened left base primarily dependent airspace disease. Increased posterior right upper lobe consolidation with air bronchograms. Similar dependent right lower lobe airspace disease. Patchy peribronchovascular areas of consolidation within the left upper lobe are felt to be similar. Upper Abdomen: Normal imaged portions of the  liver, pancreas. Gastrostomy tube. An incompletely imaged medial splenic lesion of 1.4 cm is likely a cyst or lymphangioma. Bilateral adrenal thickening and nodularity with maintenance of adreniform shape, favoring small adenomas. Musculoskeletal: Mild osteopenia.  Remote left rib fractures. IMPRESSION: 1. Mildly degraded exam as detailed  above. 2. Chronic right larger than left pleural effusions with extensive right-sided loculation. 3. Multifocal pulmonary opacities, including progressive left lower and right upper lobe consolidation. Favor primarily infection. At the right lung base, atelectasis is favored. 4. Aortic atherosclerosis (ICD10-I70.0), coronary artery atherosclerosis and emphysema (ICD10-J43.9). 5. Pulmonary artery enlargement suggests pulmonary arterial hypertension. Electronically Signed   By: Abigail Miyamoto M.D.   On: 01/26/2022 14:56   DG Chest Port 1 View  Result Date: 01/26/2022 CLINICAL DATA:  Hypoxia. EXAM: PORTABLE CHEST 1 VIEW COMPARISON:  January 19, 2022.  December 28, 2021. FINDINGS: Stable cardiomediastinal silhouette. Tracheostomy tube is in grossly good position. Increased right upper lobe opacity is noted concerning for worsening atelectasis or infiltrate with possible loculated pleural effusion. Stable bibasilar opacities are noted, left greater than right, concerning for atelectasis or infiltrates and associated effusions. Bony thorax is unremarkable. IMPRESSION: Increased right upper lobe opacity is noted concerning for worsening atelectasis or infiltrate with possible loculated right apical effusion. Stable bibasilar opacities as described above. Electronically Signed   By: Marijo Conception M.D.   On: 01/26/2022 10:55    Assessment/Plan Active Problems:   CKD (chronic kidney disease), stage III (HCC)   COPD, severe (HCC)   Acute on chronic respiratory failure with hypoxia (HCC)   Healthcare-associated pneumonia   Chronic heart failure with preserved ejection fraction (HCC)   Acute on chronic respiratory failure hypoxia will continue with the T collar patient is doing well with PMV also will continue pulmonary toilet CKD stage III supportive care will continue to monitor Severe COPD medical management inhalers or nebulizers Healthcare associated pneumonia has been treated with antibiotics Chronic  heart failure preserved ejection fraction supportive care     I have personally seen and evaluated the patient, evaluated laboratory and imaging results, formulated the assessment and plan and placed orders. The Patient requires high complexity decision making with multiple systems involvement.  Rounds were done with the Respiratory Therapy Director and Staff therapists and discussed with nursing staff also.  Allyne Gee, MD [facility] Pulmonary Critical Care Medicine Sleep Medicine

## 2022-01-28 DIAGNOSIS — I5032 Chronic diastolic (congestive) heart failure: Secondary | ICD-10-CM | POA: Diagnosis not present

## 2022-01-28 DIAGNOSIS — J9621 Acute and chronic respiratory failure with hypoxia: Secondary | ICD-10-CM | POA: Diagnosis not present

## 2022-01-28 DIAGNOSIS — N1831 Chronic kidney disease, stage 3a: Secondary | ICD-10-CM | POA: Diagnosis not present

## 2022-01-28 DIAGNOSIS — J449 Chronic obstructive pulmonary disease, unspecified: Secondary | ICD-10-CM | POA: Diagnosis not present

## 2022-01-28 LAB — BASIC METABOLIC PANEL
Anion gap: 4 — ABNORMAL LOW (ref 5–15)
BUN: 30 mg/dL — ABNORMAL HIGH (ref 8–23)
CO2: 39 mmol/L — ABNORMAL HIGH (ref 22–32)
Calcium: 9.1 mg/dL (ref 8.9–10.3)
Chloride: 95 mmol/L — ABNORMAL LOW (ref 98–111)
Creatinine, Ser: 0.49 mg/dL — ABNORMAL LOW (ref 0.61–1.24)
GFR, Estimated: >60 mL/min (ref >60)
Glucose, Bld: 105 mg/dL — ABNORMAL HIGH (ref 70–99)
Potassium: 5.1 mmol/L (ref 3.5–5.1)
Sodium: 138 mmol/L (ref 135–145)

## 2022-01-28 LAB — CBC
HCT: 36.3 % — ABNORMAL LOW (ref 39.0–52.0)
Hemoglobin: 11.2 g/dL — ABNORMAL LOW (ref 13.0–17.0)
MCH: 29.3 pg (ref 26.0–34.0)
MCHC: 30.9 g/dL (ref 30.0–36.0)
MCV: 95 fL (ref 80.0–100.0)
Platelets: 255 10*3/uL (ref 150–400)
RBC: 3.82 MIL/uL — ABNORMAL LOW (ref 4.22–5.81)
RDW: 16.5 % — ABNORMAL HIGH (ref 11.5–15.5)
WBC: 9 10*3/uL (ref 4.0–10.5)
nRBC: 0 % (ref 0.0–0.2)

## 2022-01-28 LAB — MAGNESIUM: Magnesium: 2.2 mg/dL (ref 1.7–2.4)

## 2022-01-29 DIAGNOSIS — J9621 Acute and chronic respiratory failure with hypoxia: Secondary | ICD-10-CM | POA: Diagnosis not present

## 2022-01-29 DIAGNOSIS — N1831 Chronic kidney disease, stage 3a: Secondary | ICD-10-CM | POA: Diagnosis not present

## 2022-01-29 DIAGNOSIS — J449 Chronic obstructive pulmonary disease, unspecified: Secondary | ICD-10-CM | POA: Diagnosis not present

## 2022-01-29 DIAGNOSIS — I5032 Chronic diastolic (congestive) heart failure: Secondary | ICD-10-CM | POA: Diagnosis not present

## 2022-01-29 LAB — BASIC METABOLIC PANEL
Anion gap: 5 (ref 5–15)
BUN: 28 mg/dL — ABNORMAL HIGH (ref 8–23)
CO2: 38 mmol/L — ABNORMAL HIGH (ref 22–32)
Calcium: 9 mg/dL (ref 8.9–10.3)
Chloride: 95 mmol/L — ABNORMAL LOW (ref 98–111)
Creatinine, Ser: 0.5 mg/dL — ABNORMAL LOW (ref 0.61–1.24)
GFR, Estimated: >60 mL/min (ref >60)
Glucose, Bld: 115 mg/dL — ABNORMAL HIGH (ref 70–99)
Potassium: 5 mmol/L (ref 3.5–5.1)
Sodium: 138 mmol/L (ref 135–145)

## 2022-01-29 LAB — CBC
HCT: 34.3 % — ABNORMAL LOW (ref 39.0–52.0)
Hemoglobin: 11.4 g/dL — ABNORMAL LOW (ref 13.0–17.0)
MCH: 30.7 pg (ref 26.0–34.0)
MCHC: 33.2 g/dL (ref 30.0–36.0)
MCV: 92.5 fL (ref 80.0–100.0)
Platelets: 240 10*3/uL (ref 150–400)
RBC: 3.71 MIL/uL — ABNORMAL LOW (ref 4.22–5.81)
RDW: 16.4 % — ABNORMAL HIGH (ref 11.5–15.5)
WBC: 8.1 10*3/uL (ref 4.0–10.5)
nRBC: 0 % (ref 0.0–0.2)

## 2022-01-30 ENCOUNTER — Other Ambulatory Visit (HOSPITAL_COMMUNITY): Payer: Medicare Other

## 2022-01-30 DIAGNOSIS — N1831 Chronic kidney disease, stage 3a: Secondary | ICD-10-CM | POA: Diagnosis not present

## 2022-01-30 DIAGNOSIS — I5032 Chronic diastolic (congestive) heart failure: Secondary | ICD-10-CM | POA: Diagnosis not present

## 2022-01-30 DIAGNOSIS — J449 Chronic obstructive pulmonary disease, unspecified: Secondary | ICD-10-CM | POA: Diagnosis not present

## 2022-01-30 DIAGNOSIS — J9621 Acute and chronic respiratory failure with hypoxia: Secondary | ICD-10-CM | POA: Diagnosis not present

## 2022-01-30 NOTE — Progress Notes (Signed)
Pt was brought to [place] for therapeutic right thoracentesis. Upon US examination, there was a small fluid pocket on R that appeared to be loculated and not safe to access.  Exam was ended and explained to patient.  Patient was in agreement with findings.     Narda Rutherford, AGNP-BC 01/30/2022, 11:52 AM

## 2022-01-31 DIAGNOSIS — I5032 Chronic diastolic (congestive) heart failure: Secondary | ICD-10-CM | POA: Diagnosis not present

## 2022-01-31 DIAGNOSIS — J449 Chronic obstructive pulmonary disease, unspecified: Secondary | ICD-10-CM | POA: Diagnosis not present

## 2022-01-31 DIAGNOSIS — J9621 Acute and chronic respiratory failure with hypoxia: Secondary | ICD-10-CM | POA: Diagnosis not present

## 2022-01-31 DIAGNOSIS — N1831 Chronic kidney disease, stage 3a: Secondary | ICD-10-CM | POA: Diagnosis not present

## 2022-01-31 NOTE — Progress Notes (Addendum)
Pulmonary Critical Care Medicine [place]   PULMONARY CRITICAL CARE SERVICE  PROGRESS NOTE     Kadir Azucena  XBD:[id number]  DOB: 03-20-1950   DOA: 12/20/2021  Referring Physician: Satira Sark, MD  HPI: Kenneth Fischer is a 71 y.o. male being followed for ventilator/airway/oxygen weaning Acute on Chronic Respiratory Failure.  Patient seen lying in bed, currently on ATC 60% on 4 L nasal cannula.  Patient is scheduled for thoracentesis, further evaluation by IR needed due to likely loculated effusion.  Medications: Reviewed on Rounds  Physical Exam:  Vitals: Temp 97.0, pulse 77, respirations 32, BP 119/59, SpO2 97%  Ventilator Settings 60% ATC +4 L nasal cannula  General: Comfortable at this time Neck: supple Cardiovascular: no malignant arrhythmias Respiratory: Bilaterally diminished Skin: no rash seen on limited exam Musculoskeletal: No gross abnormality Psychiatric:unable to assess Neurologic:no involuntary movements         Lab Data:   Basic Metabolic Panel: Recent Labs  Lab 01/25/22 0137 01/26/22 1752 01/28/22 0139 01/29/22 0114  NA 136  --  138 138  K 5.1 4.8 5.1 5.0  CL 96*  --  95* 95*  CO2 34*  --  39* 38*  GLUCOSE 108*  --  105* 115*  BUN 30*  --  30* 28*  CREATININE 0.48*  --  0.49* 0.50*  CALCIUM 8.8*  --  9.1 9.0  MG 2.1  --  2.2  --     ABG: No results for input(s): "PHART", "PCO2ART", "PO2ART", "HCO3", "O2SAT" in the last 168 hours.  Liver Function Tests: No results for input(s): "AST", "ALT", "ALKPHOS", "BILITOT", "PROT", "ALBUMIN" in the last 168 hours. No results for input(s): "LIPASE", "AMYLASE" in the last 168 hours. No results for input(s): "AMMONIA" in the last 168 hours.  CBC: Recent Labs  Lab 01/25/22 0137 01/28/22 0139 01/29/22 0114  WBC 10.8* 9.0 8.1  HGB 10.8* 11.2* 11.4*  HCT 33.6* 36.3* 34.3*  MCV 92.6 95.0 92.5  PLT 274 255 240    Cardiac Enzymes: No results for input(s): "CKTOTAL",  "CKMB", "CKMBINDEX", "TROPONINI" in the last 168 hours.  BNP (last 3 results) No results for input(s): "BNP" in the last 8760 hours.  ProBNP (last 3 results) No results for input(s): "PROBNP" in the last 8760 hours.  Radiological Exams: IR US CHEST  Result Date: 01/30/2022 CLINICAL DATA:  Pleural effusion. EXAM: LIMITED CHEST ULTRASOUND COMPARISON:  CT chest, 01/26/2022 FINDINGS: An ultrasound guided thoracentesis was thoroughly discussed with the patient and questions answered. The benefits, risks, alternatives and complications were also discussed. The patient understands and wishes to proceed with the procedure. Written consent was obtained. Ultrasound was performed to localize an adequate pocket of fluid in the RIGHT chest. A very small pocket of pleural effusion was demonstrated, however a safe window for puncture was not available. The procedure was aborted. Procedure performed by Narda Rutherford, IR NP IMPRESSION: Small volume RIGHT pleural effusion. Planned thoracentesis was aborted. Electronically Signed   By: Michaelle Birks M.D.   On: 01/30/2022 18:40    Assessment/Plan Active Problems:   CKD (chronic kidney disease), stage III (HCC)   COPD, severe (HCC)   Acute on chronic respiratory failure with hypoxia (HCC)   Healthcare-associated pneumonia   Chronic heart failure with preserved ejection fraction (HCC)   Acute on chronic respiratory failure hypoxia-currently on ATC 60%, with 4 L nasal cannula.  Will titrate down as patient tolerates.  Continue with supportive care. 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 Continue trach  care per protocol. CKD stage III supportive care will continue to monitor Severe COPD medical management inhalers or nebulizers Healthcare associated pneumonia has been treated with antibiotics Chronic heart failure preserved ejection fraction supportive care   I have personally seen and evaluated the patient, evaluated laboratory and imaging results,  formulated the assessment and plan and placed orders. The Patient requires high complexity decision making with multiple systems involvement.  Rounds were done with the Respiratory Therapy Director and Staff therapists and discussed with nursing staff also.  Allyne Gee, MD [facility] Pulmonary Critical Care Medicine Sleep Medicine

## 2022-01-31 NOTE — Progress Notes (Addendum)
Pulmonary Critical Care Medicine [place]   PULMONARY CRITICAL CARE SERVICE  PROGRESS NOTE     Kenneth Fischer  WPY:[id number]  DOB: April 05, 1950   DOA: 12/20/2021  Referring Physician: Satira Sark, MD  HPI: Kenneth Fischer is a 71 y.o. male being followed for ventilator/airway/oxygen weaning Acute on Chronic Respiratory Failure.  Patient seen lying in bed, currently on ATC 60%.  Has a good cough and is oral suctioning himself.  Medications: Reviewed on Rounds  Physical Exam:  Vitals: Temp 97.8, pulse 63, respirations 21, BP 132/64, SpO2 100%  Ventilator Settings ATC 60%  General: Comfortable at this time Neck: supple Cardiovascular: no malignant arrhythmias Respiratory: Bilaterally clear Skin: no rash seen on limited exam Musculoskeletal: No gross abnormality Psychiatric:unable to assess Neurologic:no involuntary movements         Lab Data:   Basic Metabolic Panel: Recent Labs  Lab 01/25/22 0137 01/26/22 1752 01/28/22 0139 01/29/22 0114  NA 136  --  138 138  K 5.1 4.8 5.1 5.0  CL 96*  --  95* 95*  CO2 34*  --  39* 38*  GLUCOSE 108*  --  105* 115*  BUN 30*  --  30* 28*  CREATININE 0.48*  --  0.49* 0.50*  CALCIUM 8.8*  --  9.1 9.0  MG 2.1  --  2.2  --     ABG: No results for input(s): "PHART", "PCO2ART", "PO2ART", "HCO3", "O2SAT" in the last 168 hours.  Liver Function Tests: No results for input(s): "AST", "ALT", "ALKPHOS", "BILITOT", "PROT", "ALBUMIN" in the last 168 hours. No results for input(s): "LIPASE", "AMYLASE" in the last 168 hours. No results for input(s): "AMMONIA" in the last 168 hours.  CBC: Recent Labs  Lab 01/25/22 0137 01/28/22 0139 01/29/22 0114  WBC 10.8* 9.0 8.1  HGB 10.8* 11.2* 11.4*  HCT 33.6* 36.3* 34.3*  MCV 92.6 95.0 92.5  PLT 274 255 240    Cardiac Enzymes: No results for input(s): "CKTOTAL", "CKMB", "CKMBINDEX", "TROPONINI" in the last 168 hours.  BNP (last 3 results) No results for input(s):  "BNP" in the last 8760 hours.  ProBNP (last 3 results) No results for input(s): "PROBNP" in the last 8760 hours.  Radiological Exams: IR US CHEST  Result Date: 01/30/2022 CLINICAL DATA:  Pleural effusion. EXAM: LIMITED CHEST ULTRASOUND COMPARISON:  CT chest, 01/26/2022 FINDINGS: An ultrasound guided thoracentesis was thoroughly discussed with the patient and questions answered. The benefits, risks, alternatives and complications were also discussed. The patient understands and wishes to proceed with the procedure. Written consent was obtained. Ultrasound was performed to localize an adequate pocket of fluid in the RIGHT chest. A very small pocket of pleural effusion was demonstrated, however a safe window for puncture was not available. The procedure was aborted. Procedure performed by Narda Rutherford, IR NP IMPRESSION: Small volume RIGHT pleural effusion. Planned thoracentesis was aborted. Electronically Signed   By: Michaelle Birks M.D.   On: 01/30/2022 18:40    Assessment/Plan Active Problems:   CKD (chronic kidney disease), stage III (HCC)   COPD, severe (HCC)   Acute on chronic respiratory failure with hypoxia (HCC)   Healthcare-associated pneumonia   Chronic heart failure with preserved ejection fraction (HCC)   Acute on chronic respiratory failure hypoxia-currently on ATC 60%, will titrate down as patient tolerates.  Continue with supportive care. 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 Continue trach care per protocol. CKD stage III supportive care will continue to monitor Severe COPD medical management inhalers or nebulizers Healthcare associated  pneumonia has been treated with antibiotics Chronic heart failure preserved ejection fraction supportive care  I have personally seen and evaluated the patient, evaluated laboratory and imaging results, formulated the assessment and plan and placed orders. The Patient requires high complexity decision making with multiple systems  involvement.  Rounds were done with the Respiratory Therapy Director and Staff therapists and discussed with nursing staff also.  Allyne Gee, MD [facility] Pulmonary Critical Care Medicine Sleep Medicine

## 2022-01-31 NOTE — Progress Notes (Cosign Needed)
Pulmonary Critical Care Medicine [place]   PULMONARY CRITICAL CARE SERVICE  PROGRESS NOTE     Kenneth Fischer  GYI:[id number]  DOB: 05-26-1950   DOA: 12/20/2021  Referring Physician: Satira Sark, MD  HPI: Kenneth Fischer is a 71 y.o. male being followed for ventilator/airway/oxygen weaning Acute on Chronic Respiratory Failure.  Patient seen lying in bed, currently on ATC 55% with 6 L nasal cannula in place.  Patient was changed back to with cuffed trach due to increased oxygen requirements.  He did go down for right thoracentesis yesterday however there was minimal output due to loculated effusion.  Medications: Reviewed on Rounds  Physical Exam:  Vitals: Temp 98.7, pulse 90, respirations 29, BP 115/68, SpO2 93%  Ventilator Settings ATC 55% FiO2 with 6 L nasal cannula  General: Comfortable at this time Neck: supple Cardiovascular: no malignant arrhythmias Respiratory: Bilaterally diminished Skin: no rash seen on limited exam Musculoskeletal: No gross abnormality Psychiatric:unable to assess Neurologic:no involuntary movements         Lab Data:   Basic Metabolic Panel: Recent Labs  Lab 01/25/22 0137 01/26/22 1752 01/28/22 0139 01/29/22 0114  NA 136  --  138 138  K 5.1 4.8 5.1 5.0  CL 96*  --  95* 95*  CO2 34*  --  39* 38*  GLUCOSE 108*  --  105* 115*  BUN 30*  --  30* 28*  CREATININE 0.48*  --  0.49* 0.50*  CALCIUM 8.8*  --  9.1 9.0  MG 2.1  --  2.2  --     ABG: No results for input(s): "PHART", "PCO2ART", "PO2ART", "HCO3", "O2SAT" in the last 168 hours.  Liver Function Tests: No results for input(s): "AST", "ALT", "ALKPHOS", "BILITOT", "PROT", "ALBUMIN" in the last 168 hours. No results for input(s): "LIPASE", "AMYLASE" in the last 168 hours. No results for input(s): "AMMONIA" in the last 168 hours.  CBC: Recent Labs  Lab 01/25/22 0137 01/28/22 0139 01/29/22 0114  WBC 10.8* 9.0 8.1  HGB 10.8* 11.2* 11.4*  HCT 33.6* 36.3*  34.3*  MCV 92.6 95.0 92.5  PLT 274 255 240    Cardiac Enzymes: No results for input(s): "CKTOTAL", "CKMB", "CKMBINDEX", "TROPONINI" in the last 168 hours.  BNP (last 3 results) No results for input(s): "BNP" in the last 8760 hours.  ProBNP (last 3 results) No results for input(s): "PROBNP" in the last 8760 hours.  Radiological Exams: IR US CHEST  Result Date: 01/30/2022 CLINICAL DATA:  Pleural effusion. EXAM: LIMITED CHEST ULTRASOUND COMPARISON:  CT chest, 01/26/2022 FINDINGS: An ultrasound guided thoracentesis was thoroughly discussed with the patient and questions answered. The benefits, risks, alternatives and complications were also discussed. The patient understands and wishes to proceed with the procedure. Written consent was obtained. Ultrasound was performed to localize an adequate pocket of fluid in the RIGHT chest. A very small pocket of pleural effusion was demonstrated, however a safe window for puncture was not available. The procedure was aborted. Procedure performed by Narda Rutherford, IR NP IMPRESSION: Small volume RIGHT pleural effusion. Planned thoracentesis was aborted. Electronically Signed   By: Michaelle Birks M.D.   On: 01/30/2022 18:40    Assessment/Plan Active Problems:   CKD (chronic kidney disease), stage III (HCC)   COPD, severe (HCC)   Acute on chronic respiratory failure with hypoxia (HCC)   Healthcare-associated pneumonia   Chronic heart failure with preserved ejection fraction (HCC)  Acute on chronic respiratory failure hypoxia-currently on ATC 55% with 6 L nasal cannula.  Continue with supportive care. 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 Continue trach care per protocol. CKD stage III supportive care will continue to monitor Severe COPD medical management inhalers or nebulizers Healthcare associated pneumonia has been treated with antibiotics Chronic heart failure preserved ejection fraction supportive care  I have personally seen and  evaluated the patient, evaluated laboratory and imaging results, formulated the assessment and plan and placed orders. The Patient requires high complexity decision making with multiple systems involvement.  Rounds were done with the Respiratory Therapy Director and Staff therapists and discussed with nursing staff also.  Allyne Gee, MD [facility] Pulmonary Critical Care Medicine Sleep Medicine

## 2022-01-31 NOTE — Progress Notes (Addendum)
Pulmonary Critical Care Medicine [place]   PULMONARY CRITICAL CARE SERVICE  PROGRESS NOTE     Greer Koeppen  CHY:[id number]  DOB: 1950/09/26   DOA: 12/20/2021  Referring Physician: Satira Sark, MD  HPI: Develle Sievers is a 71 y.o. male being followed for ventilator/airway/oxygen weaning Acute on Chronic Respiratory Failure.  Patient seen lying in bed, currently on ATC 50%.  FiO2 needs down from yesterday.  Medications: Reviewed on Rounds  Physical Exam:  Vitals: Temp 97.8, pulse 77, respirations 25, BP 134/69, SpO2 97%  Ventilator Settings ATC 50%  General: Comfortable at this time Neck: supple Cardiovascular: no malignant arrhythmias Respiratory: Bilaterally diminished Skin: no rash seen on limited exam Musculoskeletal: No gross abnormality Psychiatric:unable to assess Neurologic:no involuntary movements         Lab Data:   Basic Metabolic Panel: Recent Labs  Lab 01/25/22 0137 01/26/22 1752 01/28/22 0139 01/29/22 0114  NA 136  --  138 138  K 5.1 4.8 5.1 5.0  CL 96*  --  95* 95*  CO2 34*  --  39* 38*  GLUCOSE 108*  --  105* 115*  BUN 30*  --  30* 28*  CREATININE 0.48*  --  0.49* 0.50*  CALCIUM 8.8*  --  9.1 9.0  MG 2.1  --  2.2  --     ABG: No results for input(s): "PHART", "PCO2ART", "PO2ART", "HCO3", "O2SAT" in the last 168 hours.  Liver Function Tests: No results for input(s): "AST", "ALT", "ALKPHOS", "BILITOT", "PROT", "ALBUMIN" in the last 168 hours. No results for input(s): "LIPASE", "AMYLASE" in the last 168 hours. No results for input(s): "AMMONIA" in the last 168 hours.  CBC: Recent Labs  Lab 01/25/22 0137 01/28/22 0139 01/29/22 0114  WBC 10.8* 9.0 8.1  HGB 10.8* 11.2* 11.4*  HCT 33.6* 36.3* 34.3*  MCV 92.6 95.0 92.5  PLT 274 255 240    Cardiac Enzymes: No results for input(s): "CKTOTAL", "CKMB", "CKMBINDEX", "TROPONINI" in the last 168 hours.  BNP (last 3 results) No results for input(s): "BNP" in the  last 8760 hours.  ProBNP (last 3 results) No results for input(s): "PROBNP" in the last 8760 hours.  Radiological Exams: IR US CHEST  Result Date: 01/30/2022 CLINICAL DATA:  Pleural effusion. EXAM: LIMITED CHEST ULTRASOUND COMPARISON:  CT chest, 01/26/2022 FINDINGS: An ultrasound guided thoracentesis was thoroughly discussed with the patient and questions answered. The benefits, risks, alternatives and complications were also discussed. The patient understands and wishes to proceed with the procedure. Written consent was obtained. Ultrasound was performed to localize an adequate pocket of fluid in the RIGHT chest. A very small pocket of pleural effusion was demonstrated, however a safe window for puncture was not available. The procedure was aborted. Procedure performed by Narda Rutherford, IR NP IMPRESSION: Small volume RIGHT pleural effusion. Planned thoracentesis was aborted. Electronically Signed   By: Michaelle Birks M.D.   On: 01/30/2022 18:40    Assessment/Plan Active Problems:   CKD (chronic kidney disease), stage III (HCC)   COPD, severe (HCC)   Acute on chronic respiratory failure with hypoxia (HCC)   Healthcare-associated pneumonia   Chronic heart failure with preserved ejection fraction (HCC)  Acute on chronic respiratory failure hypoxia-currently on ATC 50%, will titrate down as patient tolerates.  Continue with supportive care. 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 Continue trach care per protocol. CKD stage III supportive care will continue to monitor Severe COPD medical management inhalers or nebulizers Healthcare associated pneumonia has been treated with  antibiotics Chronic heart failure preserved ejection fraction supportive care   I have personally seen and evaluated the patient, evaluated laboratory and imaging results, formulated the assessment and plan and placed orders. The Patient requires high complexity decision making with multiple systems involvement.   Rounds were done with the Respiratory Therapy Director and Staff therapists and discussed with nursing staff also.  Allyne Gee, MD [facility] Pulmonary Critical Care Medicine Sleep Medicine

## 2022-01-31 NOTE — Progress Notes (Cosign Needed)
Pulmonary Critical Care Medicine [place]   PULMONARY CRITICAL CARE SERVICE  PROGRESS NOTE     Kenneth Fischer  HAL:[id number]  DOB: 18-Sep-1950   DOA: 12/20/2021  Referring Physician: Satira Sark, MD  HPI: Kenneth Fischer is a 71 y.o. male being followed for ventilator/airway/oxygen weaning Acute on Chronic Respiratory Failure.  Patient seen lying in bed, currently remains on 60% ATC with 6 L nasal cannula in place.  Will go ahead and get an ABG x 1 now.  Medications: Reviewed on Rounds  Physical Exam:  Vitals: Temp 97.1, pulse 74, respirations 22, BP 105/50, SpO2 96%  Ventilator Settings ATC 60% with 6 L nasal cannula  General: Comfortable at this time Neck: supple Cardiovascular: no malignant arrhythmias Respiratory: Bilaterally diminished Skin: no rash seen on limited exam Musculoskeletal: No gross abnormality Psychiatric:unable to assess Neurologic:no involuntary movements         Lab Data:   Basic Metabolic Panel: Recent Labs  Lab 01/25/22 0137 01/26/22 1752 01/28/22 0139 01/29/22 0114  NA 136  --  138 138  K 5.1 4.8 5.1 5.0  CL 96*  --  95* 95*  CO2 34*  --  39* 38*  GLUCOSE 108*  --  105* 115*  BUN 30*  --  30* 28*  CREATININE 0.48*  --  0.49* 0.50*  CALCIUM 8.8*  --  9.1 9.0  MG 2.1  --  2.2  --     ABG: No results for input(s): "PHART", "PCO2ART", "PO2ART", "HCO3", "O2SAT" in the last 168 hours.  Liver Function Tests: No results for input(s): "AST", "ALT", "ALKPHOS", "BILITOT", "PROT", "ALBUMIN" in the last 168 hours. No results for input(s): "LIPASE", "AMYLASE" in the last 168 hours. No results for input(s): "AMMONIA" in the last 168 hours.  CBC: Recent Labs  Lab 01/25/22 0137 01/28/22 0139 01/29/22 0114  WBC 10.8* 9.0 8.1  HGB 10.8* 11.2* 11.4*  HCT 33.6* 36.3* 34.3*  MCV 92.6 95.0 92.5  PLT 274 255 240    Cardiac Enzymes: No results for input(s): "CKTOTAL", "CKMB", "CKMBINDEX", "TROPONINI" in the last 168  hours.  BNP (last 3 results) No results for input(s): "BNP" in the last 8760 hours.  ProBNP (last 3 results) No results for input(s): "PROBNP" in the last 8760 hours.  Radiological Exams: IR US CHEST  Result Date: 01/30/2022 CLINICAL DATA:  Pleural effusion. EXAM: LIMITED CHEST ULTRASOUND COMPARISON:  CT chest, 01/26/2022 FINDINGS: An ultrasound guided thoracentesis was thoroughly discussed with the patient and questions answered. The benefits, risks, alternatives and complications were also discussed. The patient understands and wishes to proceed with the procedure. Written consent was obtained. Ultrasound was performed to localize an adequate pocket of fluid in the RIGHT chest. A very small pocket of pleural effusion was demonstrated, however a safe window for puncture was not available. The procedure was aborted. Procedure performed by Narda Rutherford, IR NP IMPRESSION: Small volume RIGHT pleural effusion. Planned thoracentesis was aborted. Electronically Signed   By: Michaelle Birks M.D.   On: 01/30/2022 18:40    Assessment/Plan Active Problems:   CKD (chronic kidney disease), stage III (HCC)   COPD, severe (HCC)   Acute on chronic respiratory failure with hypoxia (HCC)   Healthcare-associated pneumonia   Chronic heart failure with preserved ejection fraction (HCC)   Acute on chronic respiratory failure hypoxia-currently on ATC 60% with 6 L nasal cannula.  Continue with supportive care. 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 Continue trach care per protocol. CKD stage III supportive care  will continue to monitor Severe COPD medical management inhalers or nebulizers Healthcare associated pneumonia has been treated with antibiotics Chronic heart failure preserved ejection fraction supportive care  I have personally seen and evaluated the patient, evaluated laboratory and imaging results, formulated the assessment and plan and placed orders. The Patient requires high complexity  decision making with multiple systems involvement.  Rounds were done with the Respiratory Therapy Director and Staff therapists and discussed with nursing staff also.  Allyne Gee, MD [facility] Pulmonary Critical Care Medicine Sleep Medicine

## 2022-02-01 DIAGNOSIS — N1831 Chronic kidney disease, stage 3a: Secondary | ICD-10-CM | POA: Diagnosis not present

## 2022-02-01 DIAGNOSIS — J449 Chronic obstructive pulmonary disease, unspecified: Secondary | ICD-10-CM | POA: Diagnosis not present

## 2022-02-01 DIAGNOSIS — I5032 Chronic diastolic (congestive) heart failure: Secondary | ICD-10-CM | POA: Diagnosis not present

## 2022-02-01 DIAGNOSIS — J9621 Acute and chronic respiratory failure with hypoxia: Secondary | ICD-10-CM | POA: Diagnosis not present

## 2022-02-01 LAB — BLOOD GAS, ARTERIAL
Acid-Base Excess: 18.9 mmol/L — ABNORMAL HIGH (ref 0.0–2.0)
Acid-Base Excess: 20.9 mmol/L — ABNORMAL HIGH (ref 0.0–2.0)
Acid-Base Excess: 20.7 mmol/L — ABNORMAL HIGH (ref 0.0–2.0)
Bicarbonate: 48.4 mmol/L — ABNORMAL HIGH (ref 20.0–28.0)
Bicarbonate: 49.3 mmol/L — ABNORMAL HIGH (ref 20.0–28.0)
Bicarbonate: 47.6 mmol/L — ABNORMAL HIGH (ref 20.0–28.0)
O2 Saturation: 97.8 %
O2 Saturation: 89.8 %
O2 Saturation: 97.1 %
Patient temperature: 37
Patient temperature: 36.5
Patient temperature: 37.7
pCO2 arterial: 80 mmHg (ref 32–48)
pCO2 arterial: 69 mmHg (ref 32–48)
pCO2 arterial: 63 mmHg — ABNORMAL HIGH (ref 32–48)
pH, Arterial: 7.39 (ref 7.35–7.45)
pH, Arterial: 7.46 — ABNORMAL HIGH (ref 7.35–7.45)
pH, Arterial: 7.49 — ABNORMAL HIGH (ref 7.35–7.45)
pO2, Arterial: 80 mmHg — ABNORMAL LOW (ref 83–108)
pO2, Arterial: 52 mmHg — ABNORMAL LOW (ref 83–108)
pO2, Arterial: 71 mmHg — ABNORMAL LOW (ref 83–108)

## 2022-02-02 ENCOUNTER — Other Ambulatory Visit (HOSPITAL_COMMUNITY): Payer: Medicare Other

## 2022-02-02 DIAGNOSIS — J9621 Acute and chronic respiratory failure with hypoxia: Secondary | ICD-10-CM | POA: Diagnosis not present

## 2022-02-02 DIAGNOSIS — I5032 Chronic diastolic (congestive) heart failure: Secondary | ICD-10-CM | POA: Diagnosis not present

## 2022-02-02 DIAGNOSIS — N1831 Chronic kidney disease, stage 3a: Secondary | ICD-10-CM | POA: Diagnosis not present

## 2022-02-02 DIAGNOSIS — J449 Chronic obstructive pulmonary disease, unspecified: Secondary | ICD-10-CM | POA: Diagnosis not present

## 2022-02-02 LAB — BASIC METABOLIC PANEL
Anion gap: 7 (ref 5–15)
BUN: 50 mg/dL — ABNORMAL HIGH (ref 8–23)
CO2: 38 mmol/L — ABNORMAL HIGH (ref 22–32)
Calcium: 8.7 mg/dL — ABNORMAL LOW (ref 8.9–10.3)
Chloride: 91 mmol/L — ABNORMAL LOW (ref 98–111)
Creatinine, Ser: 0.76 mg/dL (ref 0.61–1.24)
GFR, Estimated: >60 mL/min (ref >60)
Glucose, Bld: 96 mg/dL (ref 70–99)
Potassium: 5.3 mmol/L — ABNORMAL HIGH (ref 3.5–5.1)
Sodium: 136 mmol/L (ref 135–145)

## 2022-02-02 LAB — CBC
HCT: 30.5 % — ABNORMAL LOW (ref 39.0–52.0)
Hemoglobin: 9.6 g/dL — ABNORMAL LOW (ref 13.0–17.0)
MCH: 29.9 pg (ref 26.0–34.0)
MCHC: 31.5 g/dL (ref 30.0–36.0)
MCV: 95 fL (ref 80.0–100.0)
Platelets: 175 10*3/uL (ref 150–400)
RBC: 3.21 MIL/uL — ABNORMAL LOW (ref 4.22–5.81)
RDW: 16.4 % — ABNORMAL HIGH (ref 11.5–15.5)
WBC: 7.4 10*3/uL (ref 4.0–10.5)
nRBC: 0 % (ref 0.0–0.2)

## 2022-02-02 LAB — MAGNESIUM: Magnesium: 2.4 mg/dL (ref 1.7–2.4)

## 2022-02-02 LAB — POTASSIUM: Potassium: 4.7 mmol/L (ref 3.5–5.1)

## 2022-02-02 NOTE — Progress Notes (Signed)
Pulmonary Critical Care Medicine [place]   PULMONARY CRITICAL CARE SERVICE  PROGRESS NOTE     Kenneth Fischer  HTD:[id number]  DOB: 03/29/1950   DOA: 12/20/2021  Referring Physician: Satira Sark, MD  HPI: Kenneth Fischer is a 71 y.o. male being followed for ventilator/airway/oxygen weaning Acute on Chronic Respiratory Failure.  Patient is comfortable at this time without distress has been on assist-control mode on 28% FiO2  Medications: Reviewed on Rounds  Physical Exam:  Vitals: Temperature is 96.8 pulse 89 respiratory 16 blood pressure 100/56 saturation is 98%  Ventilator Settings assist-control FiO2 is 40% tidal volume 310 PEEP 5  General: Comfortable at this time Neck: supple Cardiovascular: no malignant arrhythmias Respiratory: Scattered rhonchi expansion equal Skin: no rash seen on limited exam Musculoskeletal: No gross abnormality Psychiatric:unable to assess Neurologic:no involuntary movements         Lab Data:   Basic Metabolic Panel: Recent Labs  Lab 01/26/22 1752 01/28/22 0139 01/29/22 0114 02/02/22 0151  NA  --  138 138 136  K 4.8 5.1 5.0 5.3*  CL  --  95* 95* 91*  CO2  --  39* 38* 38*  GLUCOSE  --  105* 115* 96  BUN  --  30* 28* 50*  CREATININE  --  0.49* 0.50* 0.76  CALCIUM  --  9.1 9.0 8.7*  MG  --  2.2  --  2.4    ABG: Recent Labs  Lab 02/01/22 1050 02/01/22 1352 02/01/22 1830  PHART 7.39 7.46* 7.49*  PCO2ART 80* 69* 63*  PO2ART 80* 52* 71*  HCO3 48.4* 49.3* 47.6*  O2SAT 97.8 89.8 97.1    Liver Function Tests: No results for input(s): "AST", "ALT", "ALKPHOS", "BILITOT", "PROT", "ALBUMIN" in the last 168 hours. No results for input(s): "LIPASE", "AMYLASE" in the last 168 hours. No results for input(s): "AMMONIA" in the last 168 hours.  CBC: Recent Labs  Lab 01/28/22 0139 01/29/22 0114 02/02/22 0151  WBC 9.0 8.1 7.4  HGB 11.2* 11.4* 9.6*  HCT 36.3* 34.3* 30.5*  MCV 95.0 92.5 95.0  PLT 255 240 175     Cardiac Enzymes: No results for input(s): "CKTOTAL", "CKMB", "CKMBINDEX", "TROPONINI" in the last 168 hours.  BNP (last 3 results) No results for input(s): "BNP" in the last 8760 hours.  ProBNP (last 3 results) No results for input(s): "PROBNP" in the last 8760 hours.  Radiological Exams: No results found.  Assessment/Plan Active Problems:   CKD (chronic kidney disease), stage III (HCC)   COPD, severe (HCC)   Acute on chronic respiratory failure with hypoxia (HCC)   Healthcare-associated pneumonia   Chronic heart failure with preserved ejection fraction (HCC)   Acute on chronic respiratory failure hypoxia plan is currently to continue with full support patient is on the ventilator at this time will get a follow-up chest x-ray to evaluate the expansion of the lung Severe COPD medical management Chronic kidney disease stage III stable supportive care stable follow-up labs Healthcare associated pneumonia treated with antibiotics Heart failure preserved ejection fraction appears to be compensated   I have personally seen and evaluated the patient, evaluated laboratory and imaging results, formulated the assessment and plan and placed orders. The Patient requires high complexity decision making with multiple systems involvement.  Rounds were done with the Respiratory Therapy Director and Staff therapists and discussed with nursing staff also.  Allyne Gee, MD [facility] Pulmonary Critical Care Medicine Sleep Medicine

## 2022-02-03 ENCOUNTER — Other Ambulatory Visit (HOSPITAL_COMMUNITY): Payer: Medicare Other

## 2022-02-03 DIAGNOSIS — N1831 Chronic kidney disease, stage 3a: Secondary | ICD-10-CM | POA: Diagnosis not present

## 2022-02-03 DIAGNOSIS — J449 Chronic obstructive pulmonary disease, unspecified: Secondary | ICD-10-CM | POA: Diagnosis not present

## 2022-02-03 DIAGNOSIS — I5032 Chronic diastolic (congestive) heart failure: Secondary | ICD-10-CM | POA: Diagnosis not present

## 2022-02-03 DIAGNOSIS — J9621 Acute and chronic respiratory failure with hypoxia: Secondary | ICD-10-CM | POA: Diagnosis not present

## 2022-02-03 NOTE — Progress Notes (Signed)
Pulmonary Critical Care Medicine [place]   PULMONARY CRITICAL CARE SERVICE  PROGRESS NOTE     Kenneth Fischer  ZOX:[id number]  DOB: Jul 09, 1950   DOA: 12/20/2021  Referring Physician: Satira Sark, MD  HPI: Kenneth Fischer is a 71 y.o. male being followed for ventilator/airway/oxygen weaning Acute on Chronic Respiratory Failure.  Patient's chest x-ray looks much better after being placed back on the ventilator.  He has significant improvement in expansion of the right upper lobe..  Trachea appears to be more midline.  Medications: Reviewed on Rounds  Physical Exam:  Vitals: Temperature is 97.3 pulse of 83 respiratory 23 blood pressure 98/47 saturation is 98%  Ventilator Settings assist-control FiO2 35% tidal volume is 500 PEEP of 5  General: Comfortable at this time Neck: supple Cardiovascular: no malignant arrhythmias Respiratory: No rhonchi no rales are noted at this time Skin: no rash seen on limited exam Musculoskeletal: No gross abnormality Psychiatric:unable to assess Neurologic:no involuntary movements         Lab Data:   Basic Metabolic Panel: Recent Labs  Lab 01/28/22 0139 01/29/22 0114 02/02/22 0151 02/02/22 1252  NA 138 138 136  --   K 5.1 5.0 5.3* 4.7  CL 95* 95* 91*  --   CO2 39* 38* 38*  --   GLUCOSE 105* 115* 96  --   BUN 30* 28* 50*  --   CREATININE 0.49* 0.50* 0.76  --   CALCIUM 9.1 9.0 8.7*  --   MG 2.2  --  2.4  --     ABG: Recent Labs  Lab 02/01/22 1050 02/01/22 1352 02/01/22 1830  PHART 7.39 7.46* 7.49*  PCO2ART 80* 69* 63*  PO2ART 80* 52* 71*  HCO3 48.4* 49.3* 47.6*  O2SAT 97.8 89.8 97.1    Liver Function Tests: No results for input(s): "AST", "ALT", "ALKPHOS", "BILITOT", "PROT", "ALBUMIN" in the last 168 hours. No results for input(s): "LIPASE", "AMYLASE" in the last 168 hours. No results for input(s): "AMMONIA" in the last 168 hours.  CBC: Recent Labs  Lab 01/28/22 0139 01/29/22 0114  02/02/22 0151  WBC 9.0 8.1 7.4  HGB 11.2* 11.4* 9.6*  HCT 36.3* 34.3* 30.5*  MCV 95.0 92.5 95.0  PLT 255 240 175    Cardiac Enzymes: No results for input(s): "CKTOTAL", "CKMB", "CKMBINDEX", "TROPONINI" in the last 168 hours.  BNP (last 3 results) No results for input(s): "BNP" in the last 8760 hours.  ProBNP (last 3 results) No results for input(s): "PROBNP" in the last 8760 hours.  Radiological Exams: DG Chest Port 1 View  Result Date: 02/03/2022 CLINICAL DATA:  Pneumothorax follow-up EXAM: PORTABLE CHEST 1 VIEW COMPARISON:  Yesterday FINDINGS: Unchanged bilateral airspace disease superimposed on hyperinflation with apical lucency. Normal heart size. Located tracheostomy tube. Unchanged small right apical pneumothorax. IMPRESSION: Unchanged pulmonary opacity and small right apical pneumothorax. Electronically Signed   By: Jorje Guild M.D.   On: 02/03/2022 07:22   DG Chest Port 1 View  Result Date: 02/02/2022 CLINICAL DATA:  Hypoxemia EXAM: PORTABLE CHEST 1 VIEW COMPARISON:  01/26/2022 FINDINGS: Tiny right apical pneumothorax, no greater than 10% volume. Improved aeration of the right upper lobe with similar appearance of heterogeneous airspace opacity and consolidation of the left-greater-than-right lung bases as well as small, loculated appearing pleural effusions. Tracheostomy. Heart size is normal. Osseous structures unremarkable. IMPRESSION: 1. Tiny right apical pneumothorax, no greater than 10% volume, possibly related to interval thoracentesis. 2. Improved aeration of the right upper lobe with similar appearance of  heterogeneous airspace opacity and consolidation of the left-greater-than-right lung bases as well as small, loculated appearing pleural effusions. No new airspace opacity. 3. Tracheostomy. These results will be called to the ordering clinician or representative by the Radiologist Assistant, and communication documented in the PACS or [employer]. Electronically  Signed   By: Delanna Ahmadi M.D.   On: 02/02/2022 13:42    Assessment/Plan Active Problems:   CKD (chronic kidney disease), stage III (HCC)   COPD, severe (HCC)   Acute on chronic respiratory failure with hypoxia (HCC)   Healthcare-associated pneumonia   Chronic heart failure with preserved ejection fraction (HCC)   Acute on chronic respiratory failure hypoxia will continue with full support patient is on assist-control mode patient will be taken off the ventilator during the daytime and then placed back on the ventilator at nighttime.  I think this will help long-term with keeping his lung expanded Severe COPD medical management will continue with vent support CKD stage III supportive care following labs Healthcare associated pneumonia has been treated Chronic heart failure preserved ejection fraction no change   I have personally seen and evaluated the patient, evaluated laboratory and imaging results, formulated the assessment and plan and placed orders. The Patient requires high complexity decision making with multiple systems involvement.  Rounds were done with the Respiratory Therapy Director and Staff therapists and discussed with nursing staff also.  Allyne Gee, MD [facility] Pulmonary Critical Care Medicine Sleep Medicine

## 2022-02-04 DIAGNOSIS — N1831 Chronic kidney disease, stage 3a: Secondary | ICD-10-CM | POA: Diagnosis not present

## 2022-02-04 DIAGNOSIS — J9621 Acute and chronic respiratory failure with hypoxia: Secondary | ICD-10-CM | POA: Diagnosis not present

## 2022-02-04 DIAGNOSIS — J449 Chronic obstructive pulmonary disease, unspecified: Secondary | ICD-10-CM | POA: Diagnosis not present

## 2022-02-04 DIAGNOSIS — I5032 Chronic diastolic (congestive) heart failure: Secondary | ICD-10-CM | POA: Diagnosis not present

## 2022-02-04 NOTE — Progress Notes (Signed)
Pulmonary Critical Care Medicine [place]   PULMONARY CRITICAL CARE SERVICE  PROGRESS NOTE     Kenneth Fischer  IRS:[id number]  DOB: 11-Nov-1950   DOA: 12/20/2021  Referring Physician: Satira Sark, MD  HPI: Kenneth Fischer is a 71 y.o. male being followed for ventilator/airway/oxygen weaning Acute on Chronic Respiratory Failure.  Patient is currently on T collar this morning looks actually much better since we put him back on the ventilator overnight  Medications: Reviewed on Rounds  Physical Exam:  Vitals: Temperature is 96.9 pulse 89 respiratory 17 blood pressure 106/59 saturation is 98%  Ventilator Settings off the ventilator T collar FiO2 40%  General: Comfortable at this time Neck: supple Cardiovascular: no malignant arrhythmias Respiratory: No rhonchi very coarse breath sounds Skin: no rash seen on limited exam Musculoskeletal: No gross abnormality Psychiatric:unable to assess Neurologic:no involuntary movements         Lab Data:   Basic Metabolic Panel: Recent Labs  Lab 01/29/22 0114 02/02/22 0151 02/02/22 1252  NA 138 136  --   K 5.0 5.3* 4.7  CL 95* 91*  --   CO2 38* 38*  --   GLUCOSE 115* 96  --   BUN 28* 50*  --   CREATININE 0.50* 0.76  --   CALCIUM 9.0 8.7*  --   MG  --  2.4  --     ABG: Recent Labs  Lab 02/01/22 1050 02/01/22 1352 02/01/22 1830  PHART 7.39 7.46* 7.49*  PCO2ART 80* 69* 63*  PO2ART 80* 52* 71*  HCO3 48.4* 49.3* 47.6*  O2SAT 97.8 89.8 97.1    Liver Function Tests: No results for input(s): "AST", "ALT", "ALKPHOS", "BILITOT", "PROT", "ALBUMIN" in the last 168 hours. No results for input(s): "LIPASE", "AMYLASE" in the last 168 hours. No results for input(s): "AMMONIA" in the last 168 hours.  CBC: Recent Labs  Lab 01/29/22 0114 02/02/22 0151  WBC 8.1 7.4  HGB 11.4* 9.6*  HCT 34.3* 30.5*  MCV 92.5 95.0  PLT 240 175    Cardiac Enzymes: No results for input(s): "CKTOTAL", "CKMB", "CKMBINDEX",  "TROPONINI" in the last 168 hours.  BNP (last 3 results) No results for input(s): "BNP" in the last 8760 hours.  ProBNP (last 3 results) No results for input(s): "PROBNP" in the last 8760 hours.  Radiological Exams: DG Chest Port 1 View  Result Date: 02/03/2022 CLINICAL DATA:  Pneumothorax follow-up EXAM: PORTABLE CHEST 1 VIEW COMPARISON:  Yesterday FINDINGS: Unchanged bilateral airspace disease superimposed on hyperinflation with apical lucency. Normal heart size. Located tracheostomy tube. Unchanged small right apical pneumothorax. IMPRESSION: Unchanged pulmonary opacity and small right apical pneumothorax. Electronically Signed   By: Jorje Guild M.D.   On: 02/03/2022 07:22   DG Chest Port 1 View  Result Date: 02/02/2022 CLINICAL DATA:  Hypoxemia EXAM: PORTABLE CHEST 1 VIEW COMPARISON:  01/26/2022 FINDINGS: Tiny right apical pneumothorax, no greater than 10% volume. Improved aeration of the right upper lobe with similar appearance of heterogeneous airspace opacity and consolidation of the left-greater-than-right lung bases as well as small, loculated appearing pleural effusions. Tracheostomy. Heart size is normal. Osseous structures unremarkable. IMPRESSION: 1. Tiny right apical pneumothorax, no greater than 10% volume, possibly related to interval thoracentesis. 2. Improved aeration of the right upper lobe with similar appearance of heterogeneous airspace opacity and consolidation of the left-greater-than-right lung bases as well as small, loculated appearing pleural effusions. No new airspace opacity. 3. Tracheostomy. These results will be called to the ordering clinician or representative by  the Psychologist, clinical, and communication documented in the PACS or [employer]. Electronically Signed   By: Delanna Ahmadi M.D.   On: 02/02/2022 13:42    Assessment/Plan Active Problems:   CKD (chronic kidney disease), stage III (HCC)   COPD, severe (HCC)   Acute on chronic respiratory  failure with hypoxia (HCC)   Healthcare-associated pneumonia   Chronic heart failure with preserved ejection fraction (HCC)   Acute on chronic respiratory failure hypoxia will continue with T collar trials as ordered continue pulmonary toilet and supportive care Severe COPD medical management Healthcare associated pneumonia has been treated with antibiotics will continue with supportive care Chronic heart failure compensated CKD monitoring patient's labs and hydration status   I have personally seen and evaluated the patient, evaluated laboratory and imaging results, formulated the assessment and plan and placed orders. The Patient requires high complexity decision making with multiple systems involvement.  Rounds were done with the Respiratory Therapy Director and Staff therapists and discussed with nursing staff also.  Allyne Gee, MD [facility] Pulmonary Critical Care Medicine Sleep Medicine

## 2022-02-05 DIAGNOSIS — I5032 Chronic diastolic (congestive) heart failure: Secondary | ICD-10-CM | POA: Diagnosis not present

## 2022-02-05 DIAGNOSIS — J9621 Acute and chronic respiratory failure with hypoxia: Secondary | ICD-10-CM | POA: Diagnosis not present

## 2022-02-05 DIAGNOSIS — N1831 Chronic kidney disease, stage 3a: Secondary | ICD-10-CM | POA: Diagnosis not present

## 2022-02-05 DIAGNOSIS — J449 Chronic obstructive pulmonary disease, unspecified: Secondary | ICD-10-CM | POA: Diagnosis not present

## 2022-02-05 LAB — BASIC METABOLIC PANEL
Anion gap: 10 (ref 5–15)
BUN: 51 mg/dL — ABNORMAL HIGH (ref 8–23)
CO2: 33 mmol/L — ABNORMAL HIGH (ref 22–32)
Calcium: 8.6 mg/dL — ABNORMAL LOW (ref 8.9–10.3)
Chloride: 94 mmol/L — ABNORMAL LOW (ref 98–111)
Creatinine, Ser: 0.88 mg/dL (ref 0.61–1.24)
GFR, Estimated: >60 mL/min (ref >60)
Glucose, Bld: 122 mg/dL — ABNORMAL HIGH (ref 70–99)
Potassium: 4.1 mmol/L (ref 3.5–5.1)
Sodium: 137 mmol/L (ref 135–145)

## 2022-02-05 LAB — CBC
HCT: 29.2 % — ABNORMAL LOW (ref 39.0–52.0)
Hemoglobin: 9.1 g/dL — ABNORMAL LOW (ref 13.0–17.0)
MCH: 29.4 pg (ref 26.0–34.0)
MCHC: 31.2 g/dL (ref 30.0–36.0)
MCV: 94.2 fL (ref 80.0–100.0)
Platelets: 179 10*3/uL (ref 150–400)
RBC: 3.1 MIL/uL — ABNORMAL LOW (ref 4.22–5.81)
RDW: 16.2 % — ABNORMAL HIGH (ref 11.5–15.5)
WBC: 6.4 10*3/uL (ref 4.0–10.5)
nRBC: 0 % (ref 0.0–0.2)

## 2022-02-05 LAB — MAGNESIUM: Magnesium: 2.5 mg/dL — ABNORMAL HIGH (ref 1.7–2.4)

## 2022-02-05 LAB — CULTURE, RESPIRATORY W GRAM STAIN

## 2022-02-05 NOTE — Progress Notes (Signed)
Pulmonary Critical Care Medicine [place]   PULMONARY CRITICAL CARE SERVICE  PROGRESS NOTE     Kenneth Fischer  AOZ:[id number]  DOB: June 13, 1950   DOA: 12/20/2021  Referring Physician: Satira Sark, MD  HPI: Kenneth Fischer is a 71 y.o. male being followed for ventilator/airway/oxygen weaning Acute on Chronic Respiratory Failure.  Patient currently is on T collar on 35% FiO2 during the daytime he is doing much better now his oxygen requirements have decreased significantly  Medications: Reviewed on Rounds  Physical Exam:  Vitals: Temperature is 97.1 pulse of 90 respiratory 29 blood pressure 110/63 saturation 97%  Ventilator Settings off the ventilator on T collar FiO2 35%  General: Comfortable at this time Neck: supple Cardiovascular: no malignant arrhythmias Respiratory: Scattered rhonchi expansion is equal Skin: no rash seen on limited exam Musculoskeletal: No gross abnormality Psychiatric:unable to assess Neurologic:no involuntary movements         Lab Data:   Basic Metabolic Panel: Recent Labs  Lab 02/02/22 0151 02/02/22 1252 02/05/22 0116  NA 136  --  137  K 5.3* 4.7 4.1  CL 91*  --  94*  CO2 38*  --  33*  GLUCOSE 96  --  122*  BUN 50*  --  51*  CREATININE 0.76  --  0.88  CALCIUM 8.7*  --  8.6*  MG 2.4  --  2.5*    ABG: Recent Labs  Lab 02/01/22 1050 02/01/22 1352 02/01/22 1830  PHART 7.39 7.46* 7.49*  PCO2ART 80* 69* 63*  PO2ART 80* 52* 71*  HCO3 48.4* 49.3* 47.6*  O2SAT 97.8 89.8 97.1    Liver Function Tests: No results for input(s): "AST", "ALT", "ALKPHOS", "BILITOT", "PROT", "ALBUMIN" in the last 168 hours. No results for input(s): "LIPASE", "AMYLASE" in the last 168 hours. No results for input(s): "AMMONIA" in the last 168 hours.  CBC: Recent Labs  Lab 02/02/22 0151 02/05/22 0116  WBC 7.4 6.4  HGB 9.6* 9.1*  HCT 30.5* 29.2*  MCV 95.0 94.2  PLT 175 179    Cardiac Enzymes: No results for input(s):  "CKTOTAL", "CKMB", "CKMBINDEX", "TROPONINI" in the last 168 hours.  BNP (last 3 results) No results for input(s): "BNP" in the last 8760 hours.  ProBNP (last 3 results) No results for input(s): "PROBNP" in the last 8760 hours.  Radiological Exams: No results found.  Assessment/Plan Active Problems:   CKD (chronic kidney disease), stage III (HCC)   COPD, severe (HCC)   Acute on chronic respiratory failure with hypoxia (HCC)   Healthcare-associated pneumonia   Chronic heart failure with preserved ejection fraction (HCC)   Acute on chronic respiratory failure with hypoxia patient currently is on T-piece on 35% FiO2 with saturations of 97%.  Will go back on the ventilator at nighttime Severe COPD medical management inhalers nebulizers Healthcare associated pneumonia with residual pulmonary damage patient is having some difficulty clearing secretions also Chronic heart failure preserved ejection fraction will continue with supportive care CKD stage III supportive care will continue to follow along closely   I have personally seen and evaluated the patient, evaluated laboratory and imaging results, formulated the assessment and plan and placed orders. The Patient requires high complexity decision making with multiple systems involvement.  Rounds were done with the Respiratory Therapy Director and Staff therapists and discussed with nursing staff also.  Allyne Gee, MD [facility] Pulmonary Critical Care Medicine Sleep Medicine

## 2022-02-06 ENCOUNTER — Other Ambulatory Visit (HOSPITAL_COMMUNITY): Payer: Medicare Other

## 2022-02-06 DIAGNOSIS — J449 Chronic obstructive pulmonary disease, unspecified: Secondary | ICD-10-CM | POA: Diagnosis not present

## 2022-02-06 DIAGNOSIS — I5032 Chronic diastolic (congestive) heart failure: Secondary | ICD-10-CM | POA: Diagnosis not present

## 2022-02-06 DIAGNOSIS — J9621 Acute and chronic respiratory failure with hypoxia: Secondary | ICD-10-CM | POA: Diagnosis not present

## 2022-02-06 DIAGNOSIS — N1831 Chronic kidney disease, stage 3a: Secondary | ICD-10-CM | POA: Diagnosis not present

## 2022-02-06 NOTE — Progress Notes (Addendum)
Pulmonary Critical Care Medicine [place]   PULMONARY CRITICAL CARE SERVICE  PROGRESS NOTE     Sedrick Tober  [id number]:[id number]  DOB: [birth year]   DOA: 12/20/2021  Referring Physician: Satira Sark, MD  HPI: Berdell Hostetler is a 71 y.o. male being followed for ventilator/airway/oxygen weaning Acute on Chronic Respiratory Failure.  Patient seen lying in bed, currently on ATC 40%.  Continue with ATC during the day and full support ventilation at night.  Medications: Reviewed on Rounds  Physical Exam:  Vitals: Temp 98.7, pulse 84, respirations 22, BP 109/62, SpO2 97%  Ventilator Settings ATC 40%  General: Comfortable at this time Neck: supple Cardiovascular: no malignant arrhythmias Respiratory: Bilaterally diminished Skin: no rash seen on limited exam Musculoskeletal: No gross abnormality Psychiatric:unable to assess Neurologic:no involuntary movements         Lab Data:   Basic Metabolic Panel: Recent Labs  Lab 02/02/22 0151 02/02/22 1252 02/05/22 0116  NA 136  --  137  K 5.3* 4.7 4.1  CL 91*  --  94*  CO2 38*  --  33*  GLUCOSE 96  --  122*  BUN 50*  --  51*  CREATININE 0.76  --  0.88  CALCIUM 8.7*  --  8.6*  MG 2.4  --  2.5*    ABG: Recent Labs  Lab 02/01/22 1050 02/01/22 1352 02/01/22 1830  PHART 7.39 7.46* 7.49*  PCO2ART 80* 69* 63*  PO2ART 80* 52* 71*  HCO3 48.4* 49.3* 47.6*  O2SAT 97.8 89.8 97.1    Liver Function Tests: No results for input(s): "AST", "ALT", "ALKPHOS", "BILITOT", "PROT", "ALBUMIN" in the last 168 hours. No results for input(s): "LIPASE", "AMYLASE" in the last 168 hours. No results for input(s): "AMMONIA" in the last 168 hours.  CBC: Recent Labs  Lab 02/02/22 0151 02/05/22 0116  WBC 7.4 6.4  HGB 9.6* 9.1*  HCT 30.5* 29.2*  MCV 95.0 94.2  PLT 175 179    Cardiac Enzymes: No results for input(s): "CKTOTAL", "CKMB", "CKMBINDEX", "TROPONINI" in the last 168 hours.  BNP (last 3 results) No results  for input(s): "BNP" in the last 8760 hours.  ProBNP (last 3 results) No results for input(s): "PROBNP" in the last 8760 hours.  Radiological Exams: DG Chest Port 1 View  Result Date: 02/06/2022 CLINICAL DATA:  Pneumothorax. EXAM: PORTABLE CHEST 1 VIEW COMPARISON:  02/03/2022 FINDINGS: 0515 hours. Tracheostomy tube evident. The cardio pericardial silhouette is enlarged. Diffuse chronic interstitial lung disease noted with scattered areas of architectural distortion and likely scarring. Superimposed areas of patchy and nodular airspace disease noted in the mid and lower lungs bilaterally. Small bilateral pleural effusions evident. Tiny right apical pneumothorax seen previously less prominent on the current exam although there is now some fluid in the right apex. Bones are diffusely demineralized. Telemetry leads overlie the chest. IMPRESSION: 1. Diffuse chronic interstitial lung disease with superimposed patchy and nodular airspace disease in the mid and lower lungs bilaterally, stable. 2. Right apical pneumothorax seen previously not readily evident on the current study although there is new fluid in the right apex today. Electronically Signed   By: Misty Stanley M.D.   On: 02/06/2022 05:42    Assessment/Plan Active Problems:   CKD (chronic kidney disease), stage III (HCC)   COPD, severe (HCC)   Acute on chronic respiratory failure with hypoxia (HCC)   Healthcare-associated pneumonia   Chronic heart failure with preserved ejection fraction ([place])   Acute on chronic respiratory failure with hypoxia-continue with ATC  during the day and full support ventilation at night. 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 Severe COPD medical management inhalers nebulizers Healthcare associated pneumonia with residual pulmonary damage patient is having some difficulty clearing secretions also Chronic heart failure preserved ejection fraction will continue with supportive care CKD stage III  supportive care will continue to follow along closely  I have personally seen and evaluated the patient, evaluated laboratory and imaging results, formulated the assessment and plan and placed orders. The Patient requires high complexity decision making with multiple systems involvement.  Rounds were done with the Respiratory Therapy Director and Staff therapists and discussed with nursing staff also.  Allyne Gee, MD [facility] Pulmonary Critical Care Medicine Sleep Medicine

## 2022-02-07 DIAGNOSIS — I5032 Chronic diastolic (congestive) heart failure: Secondary | ICD-10-CM | POA: Diagnosis not present

## 2022-02-07 DIAGNOSIS — J9621 Acute and chronic respiratory failure with hypoxia: Secondary | ICD-10-CM | POA: Diagnosis not present

## 2022-02-07 DIAGNOSIS — J449 Chronic obstructive pulmonary disease, unspecified: Secondary | ICD-10-CM | POA: Diagnosis not present

## 2022-02-07 DIAGNOSIS — N1831 Chronic kidney disease, stage 3a: Secondary | ICD-10-CM | POA: Diagnosis not present

## 2022-02-07 LAB — BASIC METABOLIC PANEL
Anion gap: 5 (ref 5–15)
BUN: 32 mg/dL — ABNORMAL HIGH (ref 8–23)
CO2: 33 mmol/L — ABNORMAL HIGH (ref 22–32)
Calcium: 8.5 mg/dL — ABNORMAL LOW (ref 8.9–10.3)
Chloride: 100 mmol/L (ref 98–111)
Creatinine, Ser: 0.66 mg/dL (ref 0.61–1.24)
GFR, Estimated: >60 mL/min (ref >60)
Glucose, Bld: 112 mg/dL — ABNORMAL HIGH (ref 70–99)
Potassium: 4.7 mmol/L (ref 3.5–5.1)
Sodium: 138 mmol/L (ref 135–145)

## 2022-02-07 LAB — CBC
HCT: 27.3 % — ABNORMAL LOW (ref 39.0–52.0)
Hemoglobin: 8.5 g/dL — ABNORMAL LOW (ref 13.0–17.0)
MCH: 29.8 pg (ref 26.0–34.0)
MCHC: 31.1 g/dL (ref 30.0–36.0)
MCV: 95.8 fL (ref 80.0–100.0)
Platelets: 213 10*3/uL (ref 150–400)
RBC: 2.85 MIL/uL — ABNORMAL LOW (ref 4.22–5.81)
RDW: 16.3 % — ABNORMAL HIGH (ref 11.5–15.5)
WBC: 6 10*3/uL (ref 4.0–10.5)
nRBC: 0 % (ref 0.0–0.2)

## 2022-02-07 LAB — VANCOMYCIN, TROUGH: Vancomycin Tr: 16 ug/mL (ref 15–20)

## 2022-02-07 NOTE — Progress Notes (Signed)
Pulmonary Critical Care Medicine [place]   PULMONARY CRITICAL CARE SERVICE  PROGRESS NOTE     Avelino Herren  LAG:[id number]  DOB: 1950/02/25   DOA: 12/20/2021  Referring Physician: Satira Sark, MD  HPI: Kenneth Fischer is a 71 y.o. male being followed for ventilator/airway/oxygen weaning Acute on Chronic Respiratory Failure.  Comfortable with no distress currently on T collar during the daytime ventilator at nighttime seems to be working to maintain his saturations and keeping his lungs fully expanded  Medications: Reviewed on Rounds  Physical Exam:  Vitals: Temperature is 98.4 pulse of 70 respiratory rate 17 blood pressure 114/64 saturation is 97%  Ventilator Settings on T collar FiO2 is 35%  General: Comfortable at this time Neck: supple Cardiovascular: no malignant arrhythmias Respiratory: Scattered rhonchi expansion is equal Skin: no rash seen on limited exam Musculoskeletal: No gross abnormality Psychiatric:unable to assess Neurologic:no involuntary movements         Lab Data:   Basic Metabolic Panel: Recent Labs  Lab 02/02/22 0151 02/02/22 1252 02/05/22 0116 02/07/22 0701  NA 136  --  137 138  K 5.3* 4.7 4.1 4.7  CL 91*  --  94* 100  CO2 38*  --  33* 33*  GLUCOSE 96  --  122* 112*  BUN 50*  --  51* 32*  CREATININE 0.76  --  0.88 0.66  CALCIUM 8.7*  --  8.6* 8.5*  MG 2.4  --  2.5*  --     ABG: Recent Labs  Lab 02/01/22 1050 02/01/22 1352 02/01/22 1830  PHART 7.39 7.46* 7.49*  PCO2ART 80* 69* 63*  PO2ART 80* 52* 71*  HCO3 48.4* 49.3* 47.6*  O2SAT 97.8 89.8 97.1    Liver Function Tests: No results for input(s): "AST", "ALT", "ALKPHOS", "BILITOT", "PROT", "ALBUMIN" in the last 168 hours. No results for input(s): "LIPASE", "AMYLASE" in the last 168 hours. No results for input(s): "AMMONIA" in the last 168 hours.  CBC: Recent Labs  Lab 02/02/22 0151 02/05/22 0116 02/07/22 0701  WBC 7.4 6.4 6.0  HGB 9.6* 9.1* 8.5*   HCT 30.5* 29.2* 27.3*  MCV 95.0 94.2 95.8  PLT 175 179 213    Cardiac Enzymes: No results for input(s): "CKTOTAL", "CKMB", "CKMBINDEX", "TROPONINI" in the last 168 hours.  BNP (last 3 results) No results for input(s): "BNP" in the last 8760 hours.  ProBNP (last 3 results) No results for input(s): "PROBNP" in the last 8760 hours.  Radiological Exams: DG Chest Port 1 View  Result Date: 02/06/2022 CLINICAL DATA:  Pneumothorax. EXAM: PORTABLE CHEST 1 VIEW COMPARISON:  02/03/2022 FINDINGS: 0515 hours. Tracheostomy tube evident. The cardio pericardial silhouette is enlarged. Diffuse chronic interstitial lung disease noted with scattered areas of architectural distortion and likely scarring. Superimposed areas of patchy and nodular airspace disease noted in the mid and lower lungs bilaterally. Small bilateral pleural effusions evident. Tiny right apical pneumothorax seen previously less prominent on the current exam although there is now some fluid in the right apex. Bones are diffusely demineralized. Telemetry leads overlie the chest. IMPRESSION: 1. Diffuse chronic interstitial lung disease with superimposed patchy and nodular airspace disease in the mid and lower lungs bilaterally, stable. 2. Right apical pneumothorax seen previously not readily evident on the current study although there is new fluid in the right apex today. Electronically Signed   By: Misty Stanley M.D.   On: 02/06/2022 05:42    Assessment/Plan Active Problems:   CKD (chronic kidney disease), stage III ([place])  COPD, severe ([place])   Acute on chronic respiratory failure with hypoxia (HCC)   Healthcare-associated pneumonia   Chronic heart failure with preserved ejection fraction (HCC)   Acute on chronic respiratory failure hypoxia will continue with T collar trials patient is on 35% FiO2 saturations are good Severe COPD medical management will continue with supportive care and provide nocturnal ventilation Healthcare  associated pneumonia treated supportive care will continue to follow along chest x-ray showed diffuse chronic interstitial lung disease with nodular airspace disease.  Patient previously had a apical pneumothorax which is not readily evident on the most recent study Chronic heart failure preserved ejection fraction supportive care will continue to monitor. CKD stage III monitoring labs improving clinically   I have personally seen and evaluated the patient, evaluated laboratory and imaging results, formulated the assessment and plan and placed orders. The Patient requires high complexity decision making with multiple systems involvement.  Rounds were done with the Respiratory Therapy Director and Staff therapists and discussed with nursing staff also.  Allyne Gee, MD [facility] Pulmonary Critical Care Medicine Sleep Medicine

## 2022-02-08 DIAGNOSIS — I5032 Chronic diastolic (congestive) heart failure: Secondary | ICD-10-CM | POA: Diagnosis not present

## 2022-02-08 DIAGNOSIS — N1831 Chronic kidney disease, stage 3a: Secondary | ICD-10-CM | POA: Diagnosis not present

## 2022-02-08 DIAGNOSIS — J9621 Acute and chronic respiratory failure with hypoxia: Secondary | ICD-10-CM | POA: Diagnosis not present

## 2022-02-08 DIAGNOSIS — J449 Chronic obstructive pulmonary disease, unspecified: Secondary | ICD-10-CM | POA: Diagnosis not present

## 2022-02-08 LAB — VANCOMYCIN, TROUGH: Vancomycin Tr: 21 ug/mL (ref 15–20)

## 2022-02-08 NOTE — Progress Notes (Signed)
Pulmonary Critical Care Medicine [place]   PULMONARY CRITICAL CARE SERVICE  PROGRESS NOTE     Kenneth Fischer  TDV:[id number]  DOB: 1950-05-05   DOA: 12/20/2021  Referring Physician: Satira Sark, MD  HPI: Kenneth Fischer is a 71 y.o. male being followed for ventilator/airway/oxygen weaning Acute on Chronic Respiratory Failure.  Patient is on T collar currently on 35% FiO2 saturations are good  Medications: Reviewed on Rounds  Physical Exam:  Vitals: Temperature is 98.7 pulse 76 respiratory 17 blood pressure 101/59 saturation is 98%  Ventilator Settings on T-piece FiO2 35%  General: Comfortable at this time Neck: supple Cardiovascular: no malignant arrhythmias Respiratory: Scattered rhonchi expansion is equal Skin: no rash seen on limited exam Musculoskeletal: No gross abnormality Psychiatric:unable to assess Neurologic:no involuntary movements         Lab Data:   Basic Metabolic Panel: Recent Labs  Lab 02/02/22 0151 02/02/22 1252 02/05/22 0116 02/07/22 0701  NA 136  --  137 138  K 5.3* 4.7 4.1 4.7  CL 91*  --  94* 100  CO2 38*  --  33* 33*  GLUCOSE 96  --  122* 112*  BUN 50*  --  51* 32*  CREATININE 0.76  --  0.88 0.66  CALCIUM 8.7*  --  8.6* 8.5*  MG 2.4  --  2.5*  --     ABG: Recent Labs  Lab 02/01/22 1050 02/01/22 1352 02/01/22 1830  PHART 7.39 7.46* 7.49*  PCO2ART 80* 69* 63*  PO2ART 80* 52* 71*  HCO3 48.4* 49.3* 47.6*  O2SAT 97.8 89.8 97.1    Liver Function Tests: No results for input(s): "AST", "ALT", "ALKPHOS", "BILITOT", "PROT", "ALBUMIN" in the last 168 hours. No results for input(s): "LIPASE", "AMYLASE" in the last 168 hours. No results for input(s): "AMMONIA" in the last 168 hours.  CBC: Recent Labs  Lab 02/02/22 0151 02/05/22 0116 02/07/22 0701  WBC 7.4 6.4 6.0  HGB 9.6* 9.1* 8.5*  HCT 30.5* 29.2* 27.3*  MCV 95.0 94.2 95.8  PLT 175 179 213    Cardiac Enzymes: No results for input(s): "CKTOTAL",  "CKMB", "CKMBINDEX", "TROPONINI" in the last 168 hours.  BNP (last 3 results) No results for input(s): "BNP" in the last 8760 hours.  ProBNP (last 3 results) No results for input(s): "PROBNP" in the last 8760 hours.  Radiological Exams: No results found.  Assessment/Plan Active Problems:   CKD (chronic kidney disease), stage III (HCC)   COPD, severe (HCC)   Acute on chronic respiratory failure with hypoxia (HCC)   Healthcare-associated pneumonia   Chronic heart failure with preserved ejection fraction (HCC)   Acute on chronic respiratory failure hypoxia will continue with the T-piece during daytime ventilator at nighttime Severe COPD medical management will continue to follow along CKD stage III supportive care following labs Healthcare associated pneumonia has been treated Chronic heart failure preserved ejection fraction no change   I have personally seen and evaluated the patient, evaluated laboratory and imaging results, formulated the assessment and plan and placed orders. The Patient requires high complexity decision making with multiple systems involvement.  Rounds were done with the Respiratory Therapy Director and Staff therapists and discussed with nursing staff also.  Allyne Gee, MD [facility] Pulmonary Critical Care Medicine Sleep Medicine

## 2022-02-09 ENCOUNTER — Other Ambulatory Visit (HOSPITAL_COMMUNITY): Payer: Medicare Other

## 2022-02-09 DIAGNOSIS — J449 Chronic obstructive pulmonary disease, unspecified: Secondary | ICD-10-CM | POA: Diagnosis not present

## 2022-02-09 DIAGNOSIS — J9621 Acute and chronic respiratory failure with hypoxia: Secondary | ICD-10-CM | POA: Diagnosis not present

## 2022-02-09 DIAGNOSIS — N1831 Chronic kidney disease, stage 3a: Secondary | ICD-10-CM | POA: Diagnosis not present

## 2022-02-09 DIAGNOSIS — I5032 Chronic diastolic (congestive) heart failure: Secondary | ICD-10-CM | POA: Diagnosis not present

## 2022-02-09 LAB — VANCOMYCIN, TROUGH: Vancomycin Tr: 14 ug/mL — ABNORMAL LOW (ref 15–20)

## 2022-02-10 DIAGNOSIS — J449 Chronic obstructive pulmonary disease, unspecified: Secondary | ICD-10-CM | POA: Diagnosis not present

## 2022-02-10 DIAGNOSIS — J9621 Acute and chronic respiratory failure with hypoxia: Secondary | ICD-10-CM | POA: Diagnosis not present

## 2022-02-10 DIAGNOSIS — N1831 Chronic kidney disease, stage 3a: Secondary | ICD-10-CM | POA: Diagnosis not present

## 2022-02-10 DIAGNOSIS — I5032 Chronic diastolic (congestive) heart failure: Secondary | ICD-10-CM | POA: Diagnosis not present

## 2022-02-10 NOTE — Progress Notes (Addendum)
Pulmonary Critical Care Medicine [place]   PULMONARY CRITICAL CARE SERVICE  PROGRESS NOTE     Kilan Banfill  [id number]:[id number]  DOB: May 26, 1950   DOA: 12/20/2021  Referring Physician: Satira Sark, MD  HPI: Kenroy Timberman is a 71 y.o. male being followed for ventilator/airway/oxygen weaning Acute on Chronic Respiratory Failure.  Patient seen lying in bed, currently on ATC 35%.  Will continue moving forward with ATC during the day and full support ventilation at night.  Medications: Reviewed on Rounds  Physical Exam:  Vitals: Temp 99.2, pulse 90, respirations 21, BP 123/62, SpO2 97%  Ventilator Settings ATC 35%  General: Comfortable at this time Neck: supple Cardiovascular: no malignant arrhythmias Respiratory: Bilaterally diminished Skin: no rash seen on limited exam Musculoskeletal: No gross abnormality Psychiatric:unable to assess Neurologic:no involuntary movements         Lab Data:   Basic Metabolic Panel: Recent Labs  Lab 02/05/22 0116 02/07/22 0701  NA 137 138  K 4.1 4.7  CL 94* 100  CO2 33* 33*  GLUCOSE 122* 112*  BUN 51* 32*  CREATININE 0.88 0.66  CALCIUM 8.6* 8.5*  MG 2.5*  --     ABG: No results for input(s): "PHART", "PCO2ART", "PO2ART", "HCO3", "O2SAT" in the last 168 hours.  Liver Function Tests: No results for input(s): "AST", "ALT", "ALKPHOS", "BILITOT", "PROT", "ALBUMIN" in the last 168 hours. No results for input(s): "LIPASE", "AMYLASE" in the last 168 hours. No results for input(s): "AMMONIA" in the last 168 hours.  CBC: Recent Labs  Lab 02/05/22 0116 02/07/22 0701  WBC 6.4 6.0  HGB 9.1* 8.5*  HCT 29.2* 27.3*  MCV 94.2 95.8  PLT 179 213    Cardiac Enzymes: No results for input(s): "CKTOTAL", "CKMB", "CKMBINDEX", "TROPONINI" in the last 168 hours.  BNP (last 3 results) No results for input(s): "BNP" in the last 8760 hours.  ProBNP (last 3 results) No results for input(s): "PROBNP" in the last 8760  hours.  Radiological Exams: DG CHEST PORT 1 VIEW  Result Date: 02/09/2022 CLINICAL DATA:  142230. Follow-up pleural effusions. Trach tube in place. EXAM: PORTABLE CHEST 1 VIEW COMPARISON:  Portable chest 02/06/2022 FINDINGS: 6:13 a.m. Tracheostomy cannula in place with tip again 6.7 cm from the carina. There is a right axillary PICC. There are bilateral upper lobe perihilar scar-like opacities, scattered calcified granulomas. The cardiac size is normal. No evidence of acute CHF. Stable mediastinum with mild aortic atherosclerosis. There are small layering pleural effusions. Some of the right pleural fluid tracks into the apex where there previously was a small apical pneumothorax on older prior studies. No pneumothorax is seen today. Patchy airspace disease in the lower lung zones appears similar, with chronic postsurgical changes in the left lower lung field. The lungs are emphysematous with no new abnormality. No worsening infiltrate is seen. Osteopenia. IMPRESSION: 1. Small layering pleural effusions. Some of the right pleural fluid tracks into the apex where there previously was a small apical pneumothorax. No visible pneumothorax today. 2. COPD with patchy airspace disease in the lower lung zones, similar to the prior study. 3. Support apparatus as above. Electronically Signed   By: Telford Nab M.D.   On: 02/09/2022 07:04    Assessment/Plan Active Problems:   CKD (chronic kidney disease), stage III (HCC)   COPD, severe (HCC)   Acute on chronic respiratory failure with hypoxia (HCC)   Healthcare-associated pneumonia   Chronic heart failure with preserved ejection fraction (HCC)   Acute on chronic respiratory failure  hypoxia will continue with the T-piece during daytime with full support ventilator at nighttime Severe COPD medical management will continue to follow along CKD stage III supportive care following labs Healthcare associated pneumonia has been treated Chronic heart failure  preserved ejection fraction no change   I have personally seen and evaluated the patient, evaluated laboratory and imaging results, formulated the assessment and plan and placed orders. The Patient requires high complexity decision making with multiple systems involvement.  Rounds were done with the Respiratory Therapy Director and Staff therapists and discussed with nursing staff also.  Allyne Gee, MD [facility] Pulmonary Critical Care Medicine Sleep Medicine

## 2022-02-10 NOTE — Progress Notes (Addendum)
Pulmonary Critical Care Medicine [place]   PULMONARY CRITICAL CARE SERVICE  PROGRESS NOTE     Kenneth Fischer  AOZ:[id number]  DOB: Jul 17, 1950   DOA: 12/20/2021  Referring Physician: Satira Sark, MD  HPI: Kenneth Fischer is a 71 y.o. male being followed for ventilator/airway/oxygen weaning Acute on Chronic Respiratory Failure.  Patient seen lying in bed, currently on ATC 40%.  Will attempt speaking valve today.  Continue with ATC during the day and full support ventilation at night.  Medications: Reviewed on Rounds  Physical Exam:  Vitals: Temp 96.5, pulse 76, respirations 16, BP 113/57, SpO2 100%  Ventilator Settings ATC 40%  General: Comfortable at this time Neck: supple Cardiovascular: no malignant arrhythmias Respiratory: Bilaterally diminished Skin: no rash seen on limited exam Musculoskeletal: No gross abnormality Psychiatric:unable to assess Neurologic:no involuntary movements         Lab Data:   Basic Metabolic Panel: Recent Labs  Lab 02/05/22 0116 02/07/22 0701  NA 137 138  K 4.1 4.7  CL 94* 100  CO2 33* 33*  GLUCOSE 122* 112*  BUN 51* 32*  CREATININE 0.88 0.66  CALCIUM 8.6* 8.5*  MG 2.5*  --     ABG: No results for input(s): "PHART", "PCO2ART", "PO2ART", "HCO3", "O2SAT" in the last 168 hours.  Liver Function Tests: No results for input(s): "AST", "ALT", "ALKPHOS", "BILITOT", "PROT", "ALBUMIN" in the last 168 hours. No results for input(s): "LIPASE", "AMYLASE" in the last 168 hours. No results for input(s): "AMMONIA" in the last 168 hours.  CBC: Recent Labs  Lab 02/05/22 0116 02/07/22 0701  WBC 6.4 6.0  HGB 9.1* 8.5*  HCT 29.2* 27.3*  MCV 94.2 95.8  PLT 179 213    Cardiac Enzymes: No results for input(s): "CKTOTAL", "CKMB", "CKMBINDEX", "TROPONINI" in the last 168 hours.  BNP (last 3 results) No results for input(s): "BNP" in the last 8760 hours.  ProBNP (last 3 results) No results for input(s): "PROBNP" in  the last 8760 hours.  Radiological Exams: DG CHEST PORT 1 VIEW  Result Date: 02/09/2022 CLINICAL DATA:  142230. Follow-up pleural effusions. Trach tube in place. EXAM: PORTABLE CHEST 1 VIEW COMPARISON:  Portable chest 02/06/2022 FINDINGS: 6:13 a.m. Tracheostomy cannula in place with tip again 6.7 cm from the carina. There is a right axillary PICC. There are bilateral upper lobe perihilar scar-like opacities, scattered calcified granulomas. The cardiac size is normal. No evidence of acute CHF. Stable mediastinum with mild aortic atherosclerosis. There are small layering pleural effusions. Some of the right pleural fluid tracks into the apex where there previously was a small apical pneumothorax on older prior studies. No pneumothorax is seen today. Patchy airspace disease in the lower lung zones appears similar, with chronic postsurgical changes in the left lower lung field. The lungs are emphysematous with no new abnormality. No worsening infiltrate is seen. Osteopenia. IMPRESSION: 1. Small layering pleural effusions. Some of the right pleural fluid tracks into the apex where there previously was a small apical pneumothorax. No visible pneumothorax today. 2. COPD with patchy airspace disease in the lower lung zones, similar to the prior study. 3. Support apparatus as above. Electronically Signed   By: Telford Nab M.D.   On: 02/09/2022 07:04    Assessment/Plan Active Problems:   CKD (chronic kidney disease), stage III (HCC)   COPD, severe (HCC)   Acute on chronic respiratory failure with hypoxia (HCC)   Healthcare-associated pneumonia   Chronic heart failure with preserved ejection fraction (HCC)  Acute on chronic  respiratory failure hypoxia will continue with the T-piece during daytime with full support ventilator at nighttime Severe COPD medical management will continue to follow along CKD stage III supportive care following labs Healthcare associated pneumonia has been treated Chronic heart  failure preserved ejection fraction no change   I have personally seen and evaluated the patient, evaluated laboratory and imaging results, formulated the assessment and plan and placed orders. The Patient requires high complexity decision making with multiple systems involvement.  Rounds were done with the Respiratory Therapy Director and Staff therapists and discussed with nursing staff also.  Allyne Gee, MD [facility] Pulmonary Critical Care Medicine Sleep Medicine

## 2022-02-11 DIAGNOSIS — I5032 Chronic diastolic (congestive) heart failure: Secondary | ICD-10-CM | POA: Diagnosis not present

## 2022-02-11 DIAGNOSIS — J9621 Acute and chronic respiratory failure with hypoxia: Secondary | ICD-10-CM | POA: Diagnosis not present

## 2022-02-11 DIAGNOSIS — N1831 Chronic kidney disease, stage 3a: Secondary | ICD-10-CM | POA: Diagnosis not present

## 2022-02-11 DIAGNOSIS — J449 Chronic obstructive pulmonary disease, unspecified: Secondary | ICD-10-CM | POA: Diagnosis not present

## 2022-02-11 LAB — BASIC METABOLIC PANEL
Anion gap: 9 (ref 5–15)
BUN: 33 mg/dL — ABNORMAL HIGH (ref 8–23)
CO2: 34 mmol/L — ABNORMAL HIGH (ref 22–32)
Calcium: 9.1 mg/dL (ref 8.9–10.3)
Chloride: 99 mmol/L (ref 98–111)
Creatinine, Ser: 0.61 mg/dL (ref 0.61–1.24)
GFR, Estimated: >60 mL/min (ref >60)
Glucose, Bld: 118 mg/dL — ABNORMAL HIGH (ref 70–99)
Potassium: 4.4 mmol/L (ref 3.5–5.1)
Sodium: 142 mmol/L (ref 135–145)

## 2022-02-11 LAB — CBC
HCT: 30.5 % — ABNORMAL LOW (ref 39.0–52.0)
Hemoglobin: 9.4 g/dL — ABNORMAL LOW (ref 13.0–17.0)
MCH: 29.2 pg (ref 26.0–34.0)
MCHC: 30.8 g/dL (ref 30.0–36.0)
MCV: 94.7 fL (ref 80.0–100.0)
Platelets: 299 10*3/uL (ref 150–400)
RBC: 3.22 MIL/uL — ABNORMAL LOW (ref 4.22–5.81)
RDW: 15.9 % — ABNORMAL HIGH (ref 11.5–15.5)
WBC: 7 10*3/uL (ref 4.0–10.5)
nRBC: 0 % (ref 0.0–0.2)

## 2022-02-11 NOTE — Progress Notes (Signed)
Pulmonary Critical Care Medicine [place]   PULMONARY CRITICAL CARE SERVICE  PROGRESS NOTE     Kenneth Fischer  DXI:[id number]  DOB: 1950-10-02   DOA: 12/20/2021  Referring Physician: Satira Sark, MD  HPI: Kenneth Fischer is a 71 y.o. male being followed for ventilator/airway/oxygen weaning Acute on Chronic Respiratory Failure.  Patient is currently on T collar has been on 35% FiO2  Medications: Reviewed on Rounds  Physical Exam:  Vitals: Temperature is 97.0 pulse of 81 respiratory rate 17 blood pressure 99/53 saturation 98%  Ventilator Settings T collar FiO2 35%  General: Comfortable at this time Neck: supple Cardiovascular: no malignant arrhythmias Respiratory: Scattered rhonchi expansion is equal Skin: no rash seen on limited exam Musculoskeletal: No gross abnormality Psychiatric:unable to assess Neurologic:no involuntary movements         Lab Data:   Basic Metabolic Panel: Recent Labs  Lab 02/05/22 0116 02/07/22 0701 02/11/22 0144  NA 137 138 142  K 4.1 4.7 4.4  CL 94* 100 99  CO2 33* 33* 34*  GLUCOSE 122* 112* 118*  BUN 51* 32* 33*  CREATININE 0.88 0.66 0.61  CALCIUM 8.6* 8.5* 9.1  MG 2.5*  --   --     ABG: No results for input(s): "PHART", "PCO2ART", "PO2ART", "HCO3", "O2SAT" in the last 168 hours.  Liver Function Tests: No results for input(s): "AST", "ALT", "ALKPHOS", "BILITOT", "PROT", "ALBUMIN" in the last 168 hours. No results for input(s): "LIPASE", "AMYLASE" in the last 168 hours. No results for input(s): "AMMONIA" in the last 168 hours.  CBC: Recent Labs  Lab 02/05/22 0116 02/07/22 0701 02/11/22 0144  WBC 6.4 6.0 7.0  HGB 9.1* 8.5* 9.4*  HCT 29.2* 27.3* 30.5*  MCV 94.2 95.8 94.7  PLT 179 213 299    Cardiac Enzymes: No results for input(s): "CKTOTAL", "CKMB", "CKMBINDEX", "TROPONINI" in the last 168 hours.  BNP (last 3 results) No results for input(s): "BNP" in the last 8760 hours.  ProBNP (last 3  results) No results for input(s): "PROBNP" in the last 8760 hours.  Radiological Exams: No results found.  Assessment/Plan Active Problems:   CKD (chronic kidney disease), stage III (HCC)   COPD, severe (HCC)   Acute on chronic respiratory failure with hypoxia (HCC)   Healthcare-associated pneumonia   Chronic heart failure with preserved ejection fraction (HCC)   Acute on chronic respiratory failure hypoxia will continue with T collar trials patient is on 35% FiO2 patient goes back on the ventilator at nighttime to rest Severe COPD medical management will continue to monitor CKD stage III no change Healthcare associated pneumonia has been treated Chronic heart failure preserved ejection fraction no change will continue to follow along closely   I have personally seen and evaluated the patient, evaluated laboratory and imaging results, formulated the assessment and plan and placed orders. The Patient requires high complexity decision making with multiple systems involvement.  Rounds were done with the Respiratory Therapy Director and Staff therapists and discussed with nursing staff also.  Allyne Gee, MD [facility] Pulmonary Critical Care Medicine Sleep Medicine

## 2022-02-12 DIAGNOSIS — J9621 Acute and chronic respiratory failure with hypoxia: Secondary | ICD-10-CM | POA: Diagnosis not present

## 2022-02-12 DIAGNOSIS — J449 Chronic obstructive pulmonary disease, unspecified: Secondary | ICD-10-CM | POA: Diagnosis not present

## 2022-02-12 DIAGNOSIS — I5032 Chronic diastolic (congestive) heart failure: Secondary | ICD-10-CM | POA: Diagnosis not present

## 2022-02-12 DIAGNOSIS — N1831 Chronic kidney disease, stage 3a: Secondary | ICD-10-CM | POA: Diagnosis not present

## 2022-02-12 NOTE — Progress Notes (Signed)
Pulmonary Critical Care Medicine [place]   PULMONARY CRITICAL CARE SERVICE  PROGRESS NOTE     Timohty Renbarger  EYC:[id number]  DOB: Nov 29, 1950   DOA: 12/20/2021  Referring Physician: Satira Sark, MD  HPI: Gage Weant is a 71 y.o. male being followed for ventilator/airway/oxygen weaning Acute on Chronic Respiratory Failure.  Patient is weaning on T-piece has been doing well with 35% FiO2 saturations are good  Medications: Reviewed on Rounds  Physical Exam:  Vitals: Temperature is 97.8 pulse 74 respiratory is 30 blood pressure is 121/65 saturation is 98%  Ventilator Settings on T collar FiO2 is 35%  General: Comfortable at this time Neck: supple Cardiovascular: no malignant arrhythmias Respiratory: Scattered rhonchi expansion is equal Skin: no rash seen on limited exam Musculoskeletal: No gross abnormality Psychiatric:unable to assess Neurologic:no involuntary movements         Lab Data:   Basic Metabolic Panel: Recent Labs  Lab 02/07/22 0701 02/11/22 0144  NA 138 142  K 4.7 4.4  CL 100 99  CO2 33* 34*  GLUCOSE 112* 118*  BUN 32* 33*  CREATININE 0.66 0.61  CALCIUM 8.5* 9.1    ABG: No results for input(s): "PHART", "PCO2ART", "PO2ART", "HCO3", "O2SAT" in the last 168 hours.  Liver Function Tests: No results for input(s): "AST", "ALT", "ALKPHOS", "BILITOT", "PROT", "ALBUMIN" in the last 168 hours. No results for input(s): "LIPASE", "AMYLASE" in the last 168 hours. No results for input(s): "AMMONIA" in the last 168 hours.  CBC: Recent Labs  Lab 02/07/22 0701 02/11/22 0144  WBC 6.0 7.0  HGB 8.5* 9.4*  HCT 27.3* 30.5*  MCV 95.8 94.7  PLT 213 299    Cardiac Enzymes: No results for input(s): "CKTOTAL", "CKMB", "CKMBINDEX", "TROPONINI" in the last 168 hours.  BNP (last 3 results) No results for input(s): "BNP" in the last 8760 hours.  ProBNP (last 3 results) No results for input(s): "PROBNP" in the last 8760  hours.  Radiological Exams: No results found.  Assessment/Plan Active Problems:   CKD (chronic kidney disease), stage III (HCC)   COPD, severe (HCC)   Acute on chronic respiratory failure with hypoxia (HCC)   Healthcare-associated pneumonia   Chronic heart failure with preserved ejection fraction (HCC)   Acute on chronic respiratory failure hypoxia will continue with T collar trials wean as tolerated rest on the ventilator at nighttime Severe COPD medical management will continue to monitor along closely. CKD stage III supportive care Healthcare associated pneumonia has been treated Chronic heart failure preserved ejection fraction no change   I have personally seen and evaluated the patient, evaluated laboratory and imaging results, formulated the assessment and plan and placed orders. The Patient requires high complexity decision making with multiple systems involvement.  Rounds were done with the Respiratory Therapy Director and Staff therapists and discussed with nursing staff also.  Allyne Gee, MD [facility] Pulmonary Critical Care Medicine Sleep Medicine

## 2022-02-13 DIAGNOSIS — J9621 Acute and chronic respiratory failure with hypoxia: Secondary | ICD-10-CM | POA: Diagnosis not present

## 2022-02-13 DIAGNOSIS — N1831 Chronic kidney disease, stage 3a: Secondary | ICD-10-CM | POA: Diagnosis not present

## 2022-02-13 DIAGNOSIS — I5032 Chronic diastolic (congestive) heart failure: Secondary | ICD-10-CM | POA: Diagnosis not present

## 2022-02-13 DIAGNOSIS — J449 Chronic obstructive pulmonary disease, unspecified: Secondary | ICD-10-CM | POA: Diagnosis not present

## 2022-02-14 DIAGNOSIS — J449 Chronic obstructive pulmonary disease, unspecified: Secondary | ICD-10-CM | POA: Diagnosis not present

## 2022-02-14 DIAGNOSIS — J9621 Acute and chronic respiratory failure with hypoxia: Secondary | ICD-10-CM | POA: Diagnosis not present

## 2022-02-14 DIAGNOSIS — N1831 Chronic kidney disease, stage 3a: Secondary | ICD-10-CM | POA: Diagnosis not present

## 2022-02-14 DIAGNOSIS — I5032 Chronic diastolic (congestive) heart failure: Secondary | ICD-10-CM | POA: Diagnosis not present

## 2022-02-15 DIAGNOSIS — J449 Chronic obstructive pulmonary disease, unspecified: Secondary | ICD-10-CM | POA: Diagnosis not present

## 2022-02-15 DIAGNOSIS — I5032 Chronic diastolic (congestive) heart failure: Secondary | ICD-10-CM | POA: Diagnosis not present

## 2022-02-15 DIAGNOSIS — J9621 Acute and chronic respiratory failure with hypoxia: Secondary | ICD-10-CM | POA: Diagnosis not present

## 2022-02-15 DIAGNOSIS — N1831 Chronic kidney disease, stage 3a: Secondary | ICD-10-CM | POA: Diagnosis not present

## 2022-02-15 LAB — CBC
HCT: 30.7 % — ABNORMAL LOW (ref 39.0–52.0)
Hemoglobin: 9.8 g/dL — ABNORMAL LOW (ref 13.0–17.0)
MCH: 29.8 pg (ref 26.0–34.0)
MCHC: 31.9 g/dL (ref 30.0–36.0)
MCV: 93.3 fL (ref 80.0–100.0)
Platelets: 367 10*3/uL (ref 150–400)
RBC: 3.29 MIL/uL — ABNORMAL LOW (ref 4.22–5.81)
RDW: 16.1 % — ABNORMAL HIGH (ref 11.5–15.5)
WBC: 7.2 10*3/uL (ref 4.0–10.5)
nRBC: 0 % (ref 0.0–0.2)

## 2022-02-15 LAB — BASIC METABOLIC PANEL
Anion gap: 10 (ref 5–15)
BUN: 33 mg/dL — ABNORMAL HIGH (ref 8–23)
CO2: 28 mmol/L (ref 22–32)
Calcium: 9 mg/dL (ref 8.9–10.3)
Chloride: 97 mmol/L — ABNORMAL LOW (ref 98–111)
Creatinine, Ser: 0.7 mg/dL (ref 0.61–1.24)
GFR, Estimated: >60 mL/min (ref >60)
Glucose, Bld: 108 mg/dL — ABNORMAL HIGH (ref 70–99)
Potassium: 4.6 mmol/L (ref 3.5–5.1)
Sodium: 135 mmol/L (ref 135–145)

## 2022-02-15 LAB — MAGNESIUM: Magnesium: 2 mg/dL (ref 1.7–2.4)

## 2022-02-16 DIAGNOSIS — J9621 Acute and chronic respiratory failure with hypoxia: Secondary | ICD-10-CM | POA: Diagnosis not present

## 2022-02-16 DIAGNOSIS — N1831 Chronic kidney disease, stage 3a: Secondary | ICD-10-CM | POA: Diagnosis not present

## 2022-02-16 DIAGNOSIS — I5032 Chronic diastolic (congestive) heart failure: Secondary | ICD-10-CM | POA: Diagnosis not present

## 2022-02-16 DIAGNOSIS — J449 Chronic obstructive pulmonary disease, unspecified: Secondary | ICD-10-CM | POA: Diagnosis not present

## 2022-02-16 NOTE — Progress Notes (Addendum)
Pulmonary Critical Care Medicine [name]   PULMONARY CRITICAL CARE SERVICE  PROGRESS NOTE     Kenneth Fischer  [id number]:[id number]  DOB: 1950-03-04   DOA: 12/20/2021  Referring Physician: Satira Sark, MD  HPI: Kenneth Fischer is a 72 y.o. male being followed for ventilator/airway/oxygen weaning Acute on Chronic Respiratory Failure.  Patient seen lying in bed, currently remains on ATC 35%.  Continue with ATC during the day and full support ventilation at night.  Patient does have a lot of secretions noted.  Did not tolerate deflating the cuff yesterday.  Medications: Reviewed on Rounds  Physical Exam:  Vitals: Temp 97.1, pulse 80, respirations 19, BP 95/58, SpO2 100%  Ventilator Settings ATC 35%  General: Comfortable at this time Neck: supple Cardiovascular: no malignant arrhythmias Respiratory: Bilaterally coarse Skin: no rash seen on limited exam Musculoskeletal: No gross abnormality Psychiatric:unable to assess Neurologic:no involuntary movements         Lab Data:   Basic Metabolic Panel: Recent Labs  Lab 02/11/22 0144 02/15/22 0119  NA 142 135  K 4.4 4.6  CL 99 97*  CO2 34* 28  GLUCOSE 118* 108*  BUN 33* 33*  CREATININE 0.61 0.70  CALCIUM 9.1 9.0  MG  --  2.0    ABG: No results for input(s): "PHART", "PCO2ART", "PO2ART", "HCO3", "O2SAT" in the last 168 hours.  Liver Function Tests: No results for input(s): "AST", "ALT", "ALKPHOS", "BILITOT", "PROT", "ALBUMIN" in the last 168 hours. No results for input(s): "LIPASE", "AMYLASE" in the last 168 hours. No results for input(s): "AMMONIA" in the last 168 hours.  CBC: Recent Labs  Lab 02/11/22 0144 02/15/22 0119  WBC 7.0 7.2  HGB 9.4* 9.8*  HCT 30.5* 30.7*  MCV 94.7 93.3  PLT 299 367    Cardiac Enzymes: No results for input(s): "CKTOTAL", "CKMB", "CKMBINDEX", "TROPONINI" in the last 168 hours.  BNP (last 3 results) No results for input(s): "BNP" in the last 8760  hours.  ProBNP (last 3 results) No results for input(s): "PROBNP" in the last 8760 hours.  Radiological Exams: No results found.  Assessment/Plan Active Problems:   CKD (chronic kidney disease), stage III (HCC)   COPD, severe (HCC)   Acute on chronic respiratory failure with hypoxia (HCC)   Healthcare-associated pneumonia   Chronic heart failure with preserved ejection fraction (HCC)  Acute on chronic respiratory failure hypoxia will continue with T collar trials wean as tolerated rest on the ventilator at nighttime Severe COPD medical management will continue to monitor along closely. CKD stage III supportive care Healthcare associated pneumonia has been treated Chronic heart failure preserved ejection fraction no change   I have personally seen and evaluated the patient, evaluated laboratory and imaging results, formulated the assessment and plan and placed orders. The Patient requires high complexity decision making with multiple systems involvement.  Rounds were done with the Respiratory Therapy Director and Staff therapists and discussed with nursing staff also.  Allyne Gee, MD [facility] Pulmonary Critical Care Medicine Sleep Medicine

## 2022-02-16 NOTE — Progress Notes (Addendum)
Pulmonary Critical Care Medicine [place]   PULMONARY CRITICAL CARE SERVICE  PROGRESS NOTE     Kenneth Fischer  [id number]  DOB: 1950-07-18   DOA: 12/20/2021  Referring Physician: Satira Sark, MD  HPI: Kenneth Fischer is a 72 y.o. male being followed for ventilator/airway/oxygen weaning Acute on Chronic Respiratory Failure.  Seen lying in bed, currently on ATC FiO2 35%.  Continue with ATC during the day with vent support at night.  Medications: Reviewed on Rounds  Physical Exam:  Vitals: Temp 97.5, pulse 73, respirations 17, BP 9451, SpO2 100%  Ventilator Settings ATC 35%  General: Comfortable at this time Neck: supple Cardiovascular: no malignant arrhythmias Respiratory: Bilaterally diminished Skin: no rash seen on limited exam Musculoskeletal: No gross abnormality Psychiatric:unable to assess Neurologic:no involuntary movements         Lab Data:   Basic Metabolic Panel: Recent Labs  Lab 02/11/22 0144 02/15/22 0119  NA 142 135  K 4.4 4.6  CL 99 97*  CO2 34* 28  GLUCOSE 118* 108*  BUN 33* 33*  CREATININE 0.61 0.70  CALCIUM 9.1 9.0  MG  --  2.0    ABG: No results for input(s): "PHART", "PCO2ART", "PO2ART", "HCO3", "O2SAT" in the last 168 hours.  Liver Function Tests: No results for input(s): "AST", "ALT", "ALKPHOS", "BILITOT", "PROT", "ALBUMIN" in the last 168 hours. No results for input(s): "LIPASE", "AMYLASE" in the last 168 hours. No results for input(s): "AMMONIA" in the last 168 hours.  CBC: Recent Labs  Lab 02/11/22 0144 02/15/22 0119  WBC 7.0 7.2  HGB 9.4* 9.8*  HCT 30.5* 30.7*  MCV 94.7 93.3  PLT 299 367    Cardiac Enzymes: No results for input(s): "CKTOTAL", "CKMB", "CKMBINDEX", "TROPONINI" in the last 168 hours.  BNP (last 3 results) No results for input(s): "BNP" in the last 8760 hours.  ProBNP (last 3 results) No results for input(s): "PROBNP" in the last 8760 hours.  Radiological Exams: No results  found.  Assessment/Plan Active Problems:   CKD (chronic kidney disease), stage III (HCC)   COPD, severe (HCC)   Acute on chronic respiratory failure with hypoxia (HCC)   Healthcare-associated pneumonia   Chronic heart failure with preserved ejection fraction (HCC)   Acute on chronic respiratory failure hypoxia -will continue with T collar trials wean as tolerated rest on the ventilator at nighttime Severe COPD medical management will continue to monitor along closely. CKD stage III supportive care Healthcare associated pneumonia has been treated Chronic heart failure preserved ejection fraction no change   I have personally seen and evaluated the patient, evaluated laboratory and imaging results, formulated the assessment and plan and placed orders. The Patient requires high complexity decision making with multiple systems involvement.  Rounds were done with the Respiratory Therapy Director and Staff therapists and discussed with nursing staff also.  Allyne Gee, MD [facility] Pulmonary Critical Care Medicine Sleep Medicine

## 2022-02-16 NOTE — Progress Notes (Signed)
Pulmonary Critical Care Medicine [place]   PULMONARY CRITICAL CARE SERVICE  PROGRESS NOTE     Kenneth Fischer  [id number]  DOB: 1950/02/17   DOA: 12/20/2021  Referring Physician: Satira Sark, MD  HPI: Kenneth Fischer is a 72 y.o. male being followed for ventilator/airway/oxygen weaning Acute on Chronic Respiratory Failure.  Patient is currently on T collar has been on 40% FiO2 using the ventilator at nighttime  Medications: Reviewed on Rounds  Physical Exam:  Vitals: Temperature is 97.4 pulse 66 respiratory 16 blood pressure 98/51 saturation is 100%  Ventilator Settings off the ventilator on T collar  General: Comfortable at this time Neck: supple Cardiovascular: no malignant arrhythmias Respiratory: No rhonchi no rales are noted at this time Skin: no rash seen on limited exam Musculoskeletal: No gross abnormality Psychiatric:unable to assess Neurologic:no involuntary movements         Lab Data:   Basic Metabolic Panel: Recent Labs  Lab 02/11/22 0144 02/15/22 0119  NA 142 135  K 4.4 4.6  CL 99 97*  CO2 34* 28  GLUCOSE 118* 108*  BUN 33* 33*  CREATININE 0.61 0.70  CALCIUM 9.1 9.0  MG  --  2.0    ABG: No results for input(s): "PHART", "PCO2ART", "PO2ART", "HCO3", "O2SAT" in the last 168 hours.  Liver Function Tests: No results for input(s): "AST", "ALT", "ALKPHOS", "BILITOT", "PROT", "ALBUMIN" in the last 168 hours. No results for input(s): "LIPASE", "AMYLASE" in the last 168 hours. No results for input(s): "AMMONIA" in the last 168 hours.  CBC: Recent Labs  Lab 02/11/22 0144 02/15/22 0119  WBC 7.0 7.2  HGB 9.4* 9.8*  HCT 30.5* 30.7*  MCV 94.7 93.3  PLT 299 367    Cardiac Enzymes: No results for input(s): "CKTOTAL", "CKMB", "CKMBINDEX", "TROPONINI" in the last 168 hours.  BNP (last 3 results) No results for input(s): "BNP" in the last 8760 hours.  ProBNP (last 3 results) No results for input(s): "PROBNP" in the  last 8760 hours.  Radiological Exams: No results found.  Assessment/Plan Active Problems:   CKD (chronic kidney disease), stage III (HCC)   COPD, severe (HCC)   Acute on chronic respiratory failure with hypoxia (HCC)   Healthcare-associated pneumonia   Chronic heart failure with preserved ejection fraction (HCC)   Acute on chronic respiratory failure hypoxia plan is going to be to continue with T collar trials patient is currently on 40% FiO2 will continue secretion management pulmonary toilet. Severe COPD medical management will continue to follow along closely. CKD stage III supportive care will continue to monitor Healthcare associated pneumonia has been treated with antibiotics Chronic heart failure preserved ejection fraction no change   I have personally seen and evaluated the patient, evaluated laboratory and imaging results, formulated the assessment and plan and placed orders. The Patient requires high complexity decision making with multiple systems involvement.  Rounds were done with the Respiratory Therapy Director and Staff therapists and discussed with nursing staff also.  Allyne Gee, MD [facility] Pulmonary Critical Care Medicine Sleep Medicine

## 2022-02-16 NOTE — Progress Notes (Addendum)
Pulmonary Critical Care Medicine [place]   PULMONARY CRITICAL CARE SERVICE  PROGRESS NOTE     Kenneth Fischer  [id number]  DOB: 11-Jun-1950   DOA: 12/20/2021  Referring Physician: Satira Sark, MD  HPI: Kenneth Fischer is a 72 y.o. male being followed for ventilator/airway/oxygen weaning Acute on Chronic Respiratory Failure.  Patient seen lying in bed, currently on ATC 40%.  Will continue with ATC during the day and vent at night.  Will go ahead and deflate cuff today for trials for PMV.  Medications: Reviewed on Rounds  Physical Exam:  Vitals: Temp 97.8, pulse 67, respirations 16, BP 118/87, SpO2 100%  Ventilator Settings ATC 40%  General: Comfortable at this time Neck: supple Cardiovascular: no malignant arrhythmias Respiratory: Bilaterally diminished Skin: no rash seen on limited exam Musculoskeletal: No gross abnormality Psychiatric:unable to assess Neurologic:no involuntary movements         Lab Data:   Basic Metabolic Panel: Recent Labs  Lab 02/11/22 0144 02/15/22 0119  NA 142 135  K 4.4 4.6  CL 99 97*  CO2 34* 28  GLUCOSE 118* 108*  BUN 33* 33*  CREATININE 0.61 0.70  CALCIUM 9.1 9.0  MG  --  2.0    ABG: No results for input(s): "PHART", "PCO2ART", "PO2ART", "HCO3", "O2SAT" in the last 168 hours.  Liver Function Tests: No results for input(s): "AST", "ALT", "ALKPHOS", "BILITOT", "PROT", "ALBUMIN" in the last 168 hours. No results for input(s): "LIPASE", "AMYLASE" in the last 168 hours. No results for input(s): "AMMONIA" in the last 168 hours.  CBC: Recent Labs  Lab 02/11/22 0144 02/15/22 0119  WBC 7.0 7.2  HGB 9.4* 9.8*  HCT 30.5* 30.7*  MCV 94.7 93.3  PLT 299 367    Cardiac Enzymes: No results for input(s): "CKTOTAL", "CKMB", "CKMBINDEX", "TROPONINI" in the last 168 hours.  BNP (last 3 results) No results for input(s): "BNP" in the last 8760 hours.  ProBNP (last 3 results) No results for input(s): "PROBNP"  in the last 8760 hours.  Radiological Exams: No results found.  Assessment/Plan Active Problems:   CKD (chronic kidney disease), stage III (HCC)   COPD, severe (HCC)   Acute on chronic respiratory failure with hypoxia (HCC)   Healthcare-associated pneumonia   Chronic heart failure with preserved ejection fraction (HCC)  Acute on chronic respiratory failure hypoxia will continue with T collar trials wean as tolerated rest on the ventilator at nighttime Severe COPD medical management will continue to monitor along closely. CKD stage III supportive care Healthcare associated pneumonia has been treated Chronic heart failure preserved ejection fraction no change   I have personally seen and evaluated the patient, evaluated laboratory and imaging results, formulated the assessment and plan and placed orders. The Patient requires high complexity decision making with multiple systems involvement.  Rounds were done with the Respiratory Therapy Director and Staff therapists and discussed with nursing staff also.  Allyne Gee, MD [facility] Pulmonary Critical Care Medicine Sleep Medicine

## 2022-02-17 DIAGNOSIS — J9621 Acute and chronic respiratory failure with hypoxia: Secondary | ICD-10-CM | POA: Diagnosis not present

## 2022-02-17 DIAGNOSIS — I5032 Chronic diastolic (congestive) heart failure: Secondary | ICD-10-CM | POA: Diagnosis not present

## 2022-02-17 DIAGNOSIS — N1831 Chronic kidney disease, stage 3a: Secondary | ICD-10-CM | POA: Diagnosis not present

## 2022-02-17 DIAGNOSIS — J449 Chronic obstructive pulmonary disease, unspecified: Secondary | ICD-10-CM | POA: Diagnosis not present

## 2022-02-17 NOTE — Progress Notes (Signed)
Pulmonary Critical Care Medicine [place]   PULMONARY CRITICAL CARE SERVICE  PROGRESS NOTE     Massey Ruhland  [id number]  DOB: 07-Apr-1950   DOA: 12/20/2021  Referring Physician: Satira Sark, MD  HPI: Amaris Garrette is a 72 y.o. male being followed for ventilator/airway/oxygen weaning Acute on Chronic Respiratory Failure.  Patient is currently off the ventilator goes back on the vent at nighttime right now is on T collar  Medications: Reviewed on Rounds  Physical Exam:  Vitals: Temperature is 97.2 pulse of 88 respiratory rate 17 blood pressure 98/49 saturation is 99%  Ventilator Settings off the ventilator on T-piece  General: Comfortable at this time Neck: supple Cardiovascular: no malignant arrhythmias Respiratory: Scattered rhonchi expansion is equal Skin: no rash seen on limited exam Musculoskeletal: No gross abnormality Psychiatric:unable to assess Neurologic:no involuntary movements         Lab Data:   Basic Metabolic Panel: Recent Labs  Lab 02/11/22 0144 02/15/22 0119  NA 142 135  K 4.4 4.6  CL 99 97*  CO2 34* 28  GLUCOSE 118* 108*  BUN 33* 33*  CREATININE 0.61 0.70  CALCIUM 9.1 9.0  MG  --  2.0    ABG: No results for input(s): "PHART", "PCO2ART", "PO2ART", "HCO3", "O2SAT" in the last 168 hours.  Liver Function Tests: No results for input(s): "AST", "ALT", "ALKPHOS", "BILITOT", "PROT", "ALBUMIN" in the last 168 hours. No results for input(s): "LIPASE", "AMYLASE" in the last 168 hours. No results for input(s): "AMMONIA" in the last 168 hours.  CBC: Recent Labs  Lab 02/11/22 0144 02/15/22 0119  WBC 7.0 7.2  HGB 9.4* 9.8*  HCT 30.5* 30.7*  MCV 94.7 93.3  PLT 299 367    Cardiac Enzymes: No results for input(s): "CKTOTAL", "CKMB", "CKMBINDEX", "TROPONINI" in the last 168 hours.  BNP (last 3 results) No results for input(s): "BNP" in the last 8760 hours.  ProBNP (last 3 results) No results for input(s):  "PROBNP" in the last 8760 hours.  Radiological Exams: No results found.  Assessment/Plan Active Problems:   CKD (chronic kidney disease), stage III (HCC)   COPD, severe (HCC)   Acute on chronic respiratory failure with hypoxia (HCC)   Healthcare-associated pneumonia   Chronic heart failure with preserved ejection fraction (HCC)   Acute on chronic respiratory failure hypoxia will continue with T collar trials titrate oxygen continue pulmonary toilet. Severe COPD medical management will continue to follow along Healthcare associated pneumonia has been treated Chronic heart failure preserved ejection fraction no change CKD supportive care monitor labs   I have personally seen and evaluated the patient, evaluated laboratory and imaging results, formulated the assessment and plan and placed orders. The Patient requires high complexity decision making with multiple systems involvement.  Rounds were done with the Respiratory Therapy Director and Staff therapists and discussed with nursing staff also.  Allyne Gee, MD [facility] Pulmonary Critical Care Medicine Sleep Medicine

## 2022-02-18 DIAGNOSIS — N1831 Chronic kidney disease, stage 3a: Secondary | ICD-10-CM | POA: Diagnosis not present

## 2022-02-18 DIAGNOSIS — J449 Chronic obstructive pulmonary disease, unspecified: Secondary | ICD-10-CM | POA: Diagnosis not present

## 2022-02-18 DIAGNOSIS — J9621 Acute and chronic respiratory failure with hypoxia: Secondary | ICD-10-CM | POA: Diagnosis not present

## 2022-02-18 DIAGNOSIS — I5032 Chronic diastolic (congestive) heart failure: Secondary | ICD-10-CM | POA: Diagnosis not present

## 2022-02-18 LAB — BASIC METABOLIC PANEL
Anion gap: 10 (ref 5–15)
BUN: 45 mg/dL — ABNORMAL HIGH (ref 8–23)
CO2: 27 mmol/L (ref 22–32)
Calcium: 8.9 mg/dL (ref 8.9–10.3)
Chloride: 99 mmol/L (ref 98–111)
Creatinine, Ser: 0.86 mg/dL (ref 0.61–1.24)
GFR, Estimated: >60 mL/min (ref >60)
Glucose, Bld: 113 mg/dL — ABNORMAL HIGH (ref 70–99)
Potassium: 5.2 mmol/L — ABNORMAL HIGH (ref 3.5–5.1)
Sodium: 136 mmol/L (ref 135–145)

## 2022-02-18 LAB — CBC
HCT: 32.5 % — ABNORMAL LOW (ref 39.0–52.0)
Hemoglobin: 9.9 g/dL — ABNORMAL LOW (ref 13.0–17.0)
MCH: 29.4 pg (ref 26.0–34.0)
MCHC: 30.5 g/dL (ref 30.0–36.0)
MCV: 96.4 fL (ref 80.0–100.0)
Platelets: 372 10*3/uL (ref 150–400)
RBC: 3.37 MIL/uL — ABNORMAL LOW (ref 4.22–5.81)
RDW: 16.2 % — ABNORMAL HIGH (ref 11.5–15.5)
WBC: 8 10*3/uL (ref 4.0–10.5)
nRBC: 0 % (ref 0.0–0.2)

## 2022-02-18 LAB — MAGNESIUM: Magnesium: 2.3 mg/dL (ref 1.7–2.4)

## 2022-02-18 NOTE — Progress Notes (Signed)
Pulmonary Critical Care Medicine [place]   PULMONARY CRITICAL CARE SERVICE  PROGRESS NOTE     Kenneth Fischer  [id number]  DOB: 20-Apr-1950   DOA: 12/20/2021  Referring Physician: Satira Sark, MD  HPI: Kenneth Fischer is a 72 y.o. male being followed for ventilator/airway/oxygen weaning Acute on Chronic Respiratory Failure.  Patient is on 35% FiO2 saturations are good  Medications: Reviewed on Rounds  Physical Exam:  Vitals: Temperature is 97.1  Ventilator Settings currently is off the ventilator on 35% T-bar  General: Comfortable at this time Neck: supple Cardiovascular: no malignant arrhythmias Respiratory: No rhonchi no rales are noted at this time Skin: no rash seen on limited exam Musculoskeletal: No gross abnormality Psychiatric:unable to assess Neurologic:no involuntary movements         Lab Data:   Basic Metabolic Panel: Recent Labs  Lab 02/15/22 0119 02/18/22 0156  NA 135 136  K 4.6 5.2*  CL 97* 99  CO2 28 27  GLUCOSE 108* 113*  BUN 33* 45*  CREATININE 0.70 0.86  CALCIUM 9.0 8.9  MG 2.0 2.3    ABG: No results for input(s): "PHART", "PCO2ART", "PO2ART", "HCO3", "O2SAT" in the last 168 hours.  Liver Function Tests: No results for input(s): "AST", "ALT", "ALKPHOS", "BILITOT", "PROT", "ALBUMIN" in the last 168 hours. No results for input(s): "LIPASE", "AMYLASE" in the last 168 hours. No results for input(s): "AMMONIA" in the last 168 hours.  CBC: Recent Labs  Lab 02/15/22 0119 02/18/22 0156  WBC 7.2 8.0  HGB 9.8* 9.9*  HCT 30.7* 32.5*  MCV 93.3 96.4  PLT 367 372    Cardiac Enzymes: No results for input(s): "CKTOTAL", "CKMB", "CKMBINDEX", "TROPONINI" in the last 168 hours.  BNP (last 3 results) No results for input(s): "BNP" in the last 8760 hours.  ProBNP (last 3 results) No results for input(s): "PROBNP" in the last 8760 hours.  Radiological Exams: No results found.  Assessment/Plan Active Problems:    CKD (chronic kidney disease), stage III (HCC)   COPD, severe (HCC)   Acute on chronic respiratory failure with hypoxia (HCC)   Healthcare-associated pneumonia   Chronic heart failure with preserved ejection fraction (HCC)   Acute on chronic respiratory failure hypoxia will continue with the T-bar titrate oxygen as tolerated continue secretion management pulmonary toilet Severe COPD medical management CKD stage III supportive care follow labs Healthcare associated pneumonia has been treated we will continue to monitor Chronic heart failure preserved ejection fraction compensated   I have personally seen and evaluated the patient, evaluated laboratory and imaging results, formulated the assessment and plan and placed orders. The Patient requires high complexity decision making with multiple systems involvement.  Rounds were done with the Respiratory Therapy Director and Staff therapists and discussed with nursing staff also.  Allyne Gee, MD [facility] Pulmonary Critical Care Medicine Sleep Medicine

## 2022-02-19 ENCOUNTER — Other Ambulatory Visit (HOSPITAL_COMMUNITY): Payer: Medicare Other

## 2022-02-19 DIAGNOSIS — I5032 Chronic diastolic (congestive) heart failure: Secondary | ICD-10-CM | POA: Diagnosis not present

## 2022-02-19 DIAGNOSIS — J449 Chronic obstructive pulmonary disease, unspecified: Secondary | ICD-10-CM | POA: Diagnosis not present

## 2022-02-19 DIAGNOSIS — N1831 Chronic kidney disease, stage 3a: Secondary | ICD-10-CM | POA: Diagnosis not present

## 2022-02-19 DIAGNOSIS — J9621 Acute and chronic respiratory failure with hypoxia: Secondary | ICD-10-CM | POA: Diagnosis not present

## 2022-02-19 LAB — POTASSIUM: Potassium: 4.8 mmol/L (ref 3.5–5.1)

## 2022-02-19 NOTE — Progress Notes (Signed)
Pulmonary Critical Care Medicine [name]   PULMONARY CRITICAL CARE SERVICE  PROGRESS NOTE     Kenneth Fischer  [id number]  DOB: 1950-03-30   DOA: 12/20/2021  Referring Physician: Satira Sark, MD  HPI: Kenneth Fischer is a 72 y.o. male being followed for ventilator/airway/oxygen weaning Acute on Chronic Respiratory Failure.  Patient is on T collar currently on 35% FiO2  Medications: Reviewed on Rounds  Physical Exam:  Vitals: Temperature is 97.1 pulse of 78 respiratory 22 blood pressure 93/47 saturation 99%  Ventilator Settings off the ventilator on T collar FiO2 of 35%  General: Comfortable at this time Neck: supple Cardiovascular: no malignant arrhythmias Respiratory: No rhonchi no rales noted at this time Skin: no rash seen on limited exam Musculoskeletal: No gross abnormality Psychiatric:unable to assess Neurologic:no involuntary movements         Lab Data:   Basic Metabolic Panel: Recent Labs  Lab 02/15/22 0119 02/18/22 0156  NA 135 136  K 4.6 5.2*  CL 97* 99  CO2 28 27  GLUCOSE 108* 113*  BUN 33* 45*  CREATININE 0.70 0.86  CALCIUM 9.0 8.9  MG 2.0 2.3    ABG: No results for input(s): "PHART", "PCO2ART", "PO2ART", "HCO3", "O2SAT" in the last 168 hours.  Liver Function Tests: No results for input(s): "AST", "ALT", "ALKPHOS", "BILITOT", "PROT", "ALBUMIN" in the last 168 hours. No results for input(s): "LIPASE", "AMYLASE" in the last 168 hours. No results for input(s): "AMMONIA" in the last 168 hours.  CBC: Recent Labs  Lab 02/15/22 0119 02/18/22 0156  WBC 7.2 8.0  HGB 9.8* 9.9*  HCT 30.7* 32.5*  MCV 93.3 96.4  PLT 367 372    Cardiac Enzymes: No results for input(s): "CKTOTAL", "CKMB", "CKMBINDEX", "TROPONINI" in the last 168 hours.  BNP (last 3 results) No results for input(s): "BNP" in the last 8760 hours.  ProBNP (last 3 results) No results for input(s): "PROBNP" in the last 8760 hours.  Radiological  Exams: No results found.  Assessment/Plan Active Problems:   CKD (chronic kidney disease), stage III (HCC)   COPD, severe (HCC)   Acute on chronic respiratory failure with hypoxia (HCC)   Healthcare-associated pneumonia   Chronic heart failure with preserved ejection fraction (HCC)   Acute on chronic respiratory failure hypoxia will continue with the T-piece wean continue secretion management pulmonary toilet Severe COPD medical management will continue to follow along closely Healthcare associated pneumonia has been treated slow improvement Chronic heart failure preserved ejection fraction compensated CKD monitoring labs   I have personally seen and evaluated the patient, evaluated laboratory and imaging results, formulated the assessment and plan and placed orders. The Patient requires high complexity decision making with multiple systems involvement.  Rounds were done with the Respiratory Therapy Director and Staff therapists and discussed with nursing staff also.  Allyne Gee, MD [facility] Pulmonary Critical Care Medicine Sleep Medicine

## 2022-02-20 DIAGNOSIS — J9621 Acute and chronic respiratory failure with hypoxia: Secondary | ICD-10-CM | POA: Diagnosis not present

## 2022-02-20 DIAGNOSIS — N1831 Chronic kidney disease, stage 3a: Secondary | ICD-10-CM | POA: Diagnosis not present

## 2022-02-20 DIAGNOSIS — J449 Chronic obstructive pulmonary disease, unspecified: Secondary | ICD-10-CM | POA: Diagnosis not present

## 2022-02-20 DIAGNOSIS — I5032 Chronic diastolic (congestive) heart failure: Secondary | ICD-10-CM | POA: Diagnosis not present

## 2022-02-20 NOTE — Progress Notes (Signed)
Pulmonary Critical Care Medicine [place]   PULMONARY CRITICAL CARE SERVICE  PROGRESS NOTE     Kenneth Fischer  [id number]:[id number]  DOB: January 20, 1951   DOA: 12/20/2021  Referring Physician: Satira Sark, MD  HPI: Kenneth Fischer is a 72 y.o. male being followed for ventilator/airway/oxygen weaning Acute on Chronic Respiratory Failure. ***  Medications: Reviewed on Rounds  Physical Exam:  Vitals: ***  Ventilator Settings ***  General: Comfortable at this time Neck: supple Cardiovascular: no malignant arrhythmias Respiratory: *** Skin: no rash seen on limited exam Musculoskeletal: No gross abnormality Psychiatric:unable to assess Neurologic:no involuntary movements         Lab Data:   Basic Metabolic Panel: Recent Labs  Lab 02/15/22 0119 02/18/22 0156 02/19/22 1646  NA 135 136  --   K 4.6 5.2* 4.8  CL 97* 99  --   CO2 28 27  --   GLUCOSE 108* 113*  --   BUN 33* 45*  --   CREATININE 0.70 0.86  --   CALCIUM 9.0 8.9  --   MG 2.0 2.3  --     ABG: No results for input(s): "PHART", "PCO2ART", "PO2ART", "HCO3", "O2SAT" in the last 168 hours.  Liver Function Tests: No results for input(s): "AST", "ALT", "ALKPHOS", "BILITOT", "PROT", "ALBUMIN" in the last 168 hours. No results for input(s): "LIPASE", "AMYLASE" in the last 168 hours. No results for input(s): "AMMONIA" in the last 168 hours.  CBC: Recent Labs  Lab 02/15/22 0119 02/18/22 0156  WBC 7.2 8.0  HGB 9.8* 9.9*  HCT 30.7* 32.5*  MCV 93.3 96.4  PLT 367 372    Cardiac Enzymes: No results for input(s): "CKTOTAL", "CKMB", "CKMBINDEX", "TROPONINI" in the last 168 hours.  BNP (last 3 results) No results for input(s): "BNP" in the last 8760 hours.  ProBNP (last 3 results) No results for input(s): "PROBNP" in the last 8760 hours.  Radiological Exams: DG CHEST PORT 1 VIEW  Result Date: 02/19/2022 CLINICAL DATA:  Evaluate for pleural effusion. EXAM: PORTABLE CHEST 1 VIEW COMPARISON:   02/09/2022 FINDINGS: Tracheostomy tube tip is above the carina. There is diffuse coarsened interstitial markings identified throughout both lungs. Bilateral pleural effusions are identified. Asymmetric opacification overlying the right mid and right lower lung is a new finding compared with the previous exam. Patchy airspace densities within the left lower lung appear unchanged from previous exam. IMPRESSION: 1. New asymmetric opacification overlying the right mid and right lower lung. 2. Persistent patchy airspace densities in the left lower lung. 3. Chronic interstitial coarsening. 4. Bilateral pleural effusions.  Similar to previous study. Electronically Signed   By: Kerby Moors M.D.   On: 02/19/2022 15:43    Assessment/Plan Active Problems:   CKD (chronic kidney disease), stage III (HCC)   COPD, severe (HCC)   Acute on chronic respiratory failure with hypoxia (HCC)   Healthcare-associated pneumonia   Chronic heart failure with preserved ejection fraction (HCC)   *** *** *** *** ***   I have personally seen and evaluated the patient, evaluated laboratory and imaging results, formulated the assessment and plan and placed orders. The Patient requires high complexity decision making with multiple systems involvement.  Rounds were done with the Respiratory Therapy Director and Staff therapists and discussed with nursing staff also.  Allyne Gee, MD [facility] Pulmonary Critical Care Medicine Sleep Medicine

## 2022-02-21 DIAGNOSIS — N1831 Chronic kidney disease, stage 3a: Secondary | ICD-10-CM | POA: Diagnosis not present

## 2022-02-21 DIAGNOSIS — I5032 Chronic diastolic (congestive) heart failure: Secondary | ICD-10-CM | POA: Diagnosis not present

## 2022-02-21 DIAGNOSIS — J449 Chronic obstructive pulmonary disease, unspecified: Secondary | ICD-10-CM | POA: Diagnosis not present

## 2022-02-21 DIAGNOSIS — J9621 Acute and chronic respiratory failure with hypoxia: Secondary | ICD-10-CM | POA: Diagnosis not present

## 2022-02-21 LAB — CBC
HCT: 38.3 % — ABNORMAL LOW (ref 39.0–52.0)
Hemoglobin: 11.9 g/dL — ABNORMAL LOW (ref 13.0–17.0)
MCH: 28.1 pg (ref 26.0–34.0)
MCHC: 31.1 g/dL (ref 30.0–36.0)
MCV: 90.3 fL (ref 80.0–100.0)
Platelets: 105 10*3/uL — ABNORMAL LOW (ref 150–400)
RBC: 4.24 MIL/uL (ref 4.22–5.81)
RDW: 14.4 % (ref 11.5–15.5)
WBC: 7.3 10*3/uL (ref 4.0–10.5)
nRBC: 0 % (ref 0.0–0.2)

## 2022-02-21 LAB — BASIC METABOLIC PANEL
Anion gap: 10 (ref 5–15)
BUN: 18 mg/dL (ref 8–23)
CO2: 29 mmol/L (ref 22–32)
Calcium: 8.6 mg/dL — ABNORMAL LOW (ref 8.9–10.3)
Chloride: 95 mmol/L — ABNORMAL LOW (ref 98–111)
Creatinine, Ser: 0.58 mg/dL — ABNORMAL LOW (ref 0.61–1.24)
GFR, Estimated: >60 mL/min (ref >60)
Glucose, Bld: 197 mg/dL — ABNORMAL HIGH (ref 70–99)
Potassium: 4.8 mmol/L (ref 3.5–5.1)
Sodium: 134 mmol/L — ABNORMAL LOW (ref 135–145)

## 2022-02-21 LAB — MAGNESIUM: Magnesium: 2.2 mg/dL (ref 1.7–2.4)

## 2022-02-21 NOTE — Progress Notes (Signed)
Pulmonary Critical Care Medicine [place]   PULMONARY CRITICAL CARE SERVICE  PROGRESS NOTE     Kenneth Fischer  [id number]:[id number]  DOB: [birth year]   DOA: 12/20/2021  Referring Physician: Satira Sark, MD  HPI: Kenneth Fischer is a 72 y.o. male being followed for ventilator/airway/oxygen weaning Acute on Chronic Respiratory Failure.  Patient is on T collar currently on 35% FiO2 saturations are good  Medications: Reviewed on Rounds  Physical Exam:  Vitals: Temperature is 97.9 pulse of 73 respiratory rate is 22 blood pressure 96/70 saturation is 99%  Ventilator Settings on T-piece FiO2 is 35%  General: Comfortable at this time Neck: supple Cardiovascular: no malignant arrhythmias Respiratory: No rhonchi no rales are noted at this time Skin: no rash seen on limited exam Musculoskeletal: No gross abnormality Psychiatric:unable to assess Neurologic:no involuntary movements         Lab Data:   Basic Metabolic Panel: Recent Labs  Lab 02/15/22 0119 02/18/22 0156 02/19/22 1646 02/21/22 0202  NA 135 136  --  134*  K 4.6 5.2* 4.8 4.8  CL 97* 99  --  95*  CO2 28 27  --  29  GLUCOSE 108* 113*  --  197*  BUN 33* 45*  --  18  CREATININE 0.70 0.86  --  0.58*  CALCIUM 9.0 8.9  --  8.6*  MG 2.0 2.3  --  2.2    ABG: No results for input(s): "PHART", "PCO2ART", "PO2ART", "HCO3", "O2SAT" in the last 168 hours.  Liver Function Tests: No results for input(s): "AST", "ALT", "ALKPHOS", "BILITOT", "PROT", "ALBUMIN" in the last 168 hours. No results for input(s): "LIPASE", "AMYLASE" in the last 168 hours. No results for input(s): "AMMONIA" in the last 168 hours.  CBC: Recent Labs  Lab 02/15/22 0119 02/18/22 0156 02/21/22 0202  WBC 7.2 8.0 7.3  HGB 9.8* 9.9* 11.9*  HCT 30.7* 32.5* 38.3*  MCV 93.3 96.4 90.3  PLT 367 372 105*    Cardiac Enzymes: No results for input(s): "CKTOTAL", "CKMB", "CKMBINDEX", "TROPONINI" in the last 168 hours.  BNP (last 3  results) No results for input(s): "BNP" in the last 8760 hours.  ProBNP (last 3 results) No results for input(s): "PROBNP" in the last 8760 hours.  Radiological Exams: DG CHEST PORT 1 VIEW  Result Date: 02/19/2022 CLINICAL DATA:  Evaluate for pleural effusion. EXAM: PORTABLE CHEST 1 VIEW COMPARISON:  02/09/2022 FINDINGS: Tracheostomy tube tip is above the carina. There is diffuse coarsened interstitial markings identified throughout both lungs. Bilateral pleural effusions are identified. Asymmetric opacification overlying the right mid and right lower lung is a new finding compared with the previous exam. Patchy airspace densities within the left lower lung appear unchanged from previous exam. IMPRESSION: 1. New asymmetric opacification overlying the right mid and right lower lung. 2. Persistent patchy airspace densities in the left lower lung. 3. Chronic interstitial coarsening. 4. Bilateral pleural effusions.  Similar to previous study. Electronically Signed   By: Kerby Moors M.D.   On: 02/19/2022 15:43    Assessment/Plan Active Problems:   CKD (chronic kidney disease), stage III (HCC)   COPD, severe (HCC)   Acute on chronic respiratory failure with hypoxia (HCC)   Healthcare-associated pneumonia   Chronic heart failure with preserved ejection fraction (HCC)   Acute on chronic respiratory failure hypoxia will continue with T-piece and rest on the ventilator at night CKD stage III supportive care will continue to follow along Severe COPD medical management continue to monitor Healthcare associated pneumonia has  been treated with antibiotics Chronic heart failure preserved ejection fraction no change   I have personally seen and evaluated the patient, evaluated laboratory and imaging results, formulated the assessment and plan and placed orders. The Patient requires high complexity decision making with multiple systems involvement.  Rounds were done with the Respiratory Therapy  Director and Staff therapists and discussed with nursing staff also.  Allyne Gee, MD [facility] Pulmonary Critical Care Medicine Sleep Medicine

## 2022-02-22 DIAGNOSIS — I5032 Chronic diastolic (congestive) heart failure: Secondary | ICD-10-CM | POA: Diagnosis not present

## 2022-02-22 DIAGNOSIS — N1831 Chronic kidney disease, stage 3a: Secondary | ICD-10-CM | POA: Diagnosis not present

## 2022-02-22 DIAGNOSIS — J9621 Acute and chronic respiratory failure with hypoxia: Secondary | ICD-10-CM | POA: Diagnosis not present

## 2022-02-22 DIAGNOSIS — J449 Chronic obstructive pulmonary disease, unspecified: Secondary | ICD-10-CM | POA: Diagnosis not present

## 2022-02-22 LAB — CULTURE, RESPIRATORY W GRAM STAIN

## 2022-02-22 NOTE — Progress Notes (Signed)
Pulmonary Critical Care Medicine [place]   PULMONARY CRITICAL CARE SERVICE  PROGRESS NOTE     Kei Mcelhiney  [id number]:[id number]  DOB: May 07, 1950   DOA: 12/20/2021  Referring Physician: Satira Sark, MD  HPI: Kenneth Fischer is a 72 y.o. male being followed for ventilator/airway/oxygen weaning Acute on Chronic Respiratory Failure.  Patient is afebrile resting comfortably has been on T collar using the PMV  Medications: Reviewed on Rounds  Physical Exam:  Vitals: Temperature is 98.3 pulse 79 respiratory is 18 blood pressure 103/61 saturations 100%  Ventilator Settings on T collar with the PMV in place  General: Comfortable at this time Neck: supple Cardiovascular: no malignant arrhythmias Respiratory: No rhonchi no rales are noted at this time Skin: no rash seen on limited exam Musculoskeletal: No gross abnormality Psychiatric:unable to assess Neurologic:no involuntary movements         Lab Data:   Basic Metabolic Panel: Recent Labs  Lab 02/18/22 0156 02/19/22 1646 02/21/22 0202  NA 136  --  134*  K 5.2* 4.8 4.8  CL 99  --  95*  CO2 27  --  29  GLUCOSE 113*  --  197*  BUN 45*  --  18  CREATININE 0.86  --  0.58*  CALCIUM 8.9  --  8.6*  MG 2.3  --  2.2    ABG: No results for input(s): "PHART", "PCO2ART", "PO2ART", "HCO3", "O2SAT" in the last 168 hours.  Liver Function Tests: No results for input(s): "AST", "ALT", "ALKPHOS", "BILITOT", "PROT", "ALBUMIN" in the last 168 hours. No results for input(s): "LIPASE", "AMYLASE" in the last 168 hours. No results for input(s): "AMMONIA" in the last 168 hours.  CBC: Recent Labs  Lab 02/18/22 0156 02/21/22 0202  WBC 8.0 7.3  HGB 9.9* 11.9*  HCT 32.5* 38.3*  MCV 96.4 90.3  PLT 372 105*    Cardiac Enzymes: No results for input(s): "CKTOTAL", "CKMB", "CKMBINDEX", "TROPONINI" in the last 168 hours.  BNP (last 3 results) No results for input(s): "BNP" in the last 8760 hours.  ProBNP (last 3  results) No results for input(s): "PROBNP" in the last 8760 hours.  Radiological Exams: No results found.  Assessment/Plan Active Problems:   CKD (chronic kidney disease), stage III (HCC)   COPD, severe (HCC)   Acute on chronic respiratory failure with hypoxia (HCC)   Healthcare-associated pneumonia   Chronic heart failure with preserved ejection fraction (HCC)   Acute on chronic respiratory failure hypoxia plan is going to be to continue with T collar and PMV during the daytime go back on the ventilator at night Severe COPD medical management will continue to follow along closely Chronic kidney disease stage III supportive care Healthcare associated pneumonia has been treated Chronic heart failure preserved ejection fraction appears to be compensated   I have personally seen and evaluated the patient, evaluated laboratory and imaging results, formulated the assessment and plan and placed orders. The Patient requires high complexity decision making with multiple systems involvement.  Rounds were done with the Respiratory Therapy Director and Staff therapists and discussed with nursing staff also.  Allyne Gee, MD [facility] Pulmonary Critical Care Medicine Sleep Medicine

## 2022-02-23 DIAGNOSIS — I5032 Chronic diastolic (congestive) heart failure: Secondary | ICD-10-CM | POA: Diagnosis not present

## 2022-02-23 DIAGNOSIS — N1831 Chronic kidney disease, stage 3a: Secondary | ICD-10-CM | POA: Diagnosis not present

## 2022-02-23 DIAGNOSIS — J9621 Acute and chronic respiratory failure with hypoxia: Secondary | ICD-10-CM | POA: Diagnosis not present

## 2022-02-23 DIAGNOSIS — J449 Chronic obstructive pulmonary disease, unspecified: Secondary | ICD-10-CM | POA: Diagnosis not present

## 2022-02-24 DIAGNOSIS — J9621 Acute and chronic respiratory failure with hypoxia: Secondary | ICD-10-CM | POA: Diagnosis not present

## 2022-02-24 DIAGNOSIS — I5032 Chronic diastolic (congestive) heart failure: Secondary | ICD-10-CM | POA: Diagnosis not present

## 2022-02-24 DIAGNOSIS — J449 Chronic obstructive pulmonary disease, unspecified: Secondary | ICD-10-CM | POA: Diagnosis not present

## 2022-02-24 DIAGNOSIS — N1831 Chronic kidney disease, stage 3a: Secondary | ICD-10-CM | POA: Diagnosis not present

## 2022-02-25 ENCOUNTER — Other Ambulatory Visit (HOSPITAL_COMMUNITY): Payer: Medicare Other

## 2022-02-25 DIAGNOSIS — I5032 Chronic diastolic (congestive) heart failure: Secondary | ICD-10-CM | POA: Diagnosis not present

## 2022-02-25 DIAGNOSIS — J9621 Acute and chronic respiratory failure with hypoxia: Secondary | ICD-10-CM | POA: Diagnosis not present

## 2022-02-25 DIAGNOSIS — J449 Chronic obstructive pulmonary disease, unspecified: Secondary | ICD-10-CM | POA: Diagnosis not present

## 2022-02-25 DIAGNOSIS — N1831 Chronic kidney disease, stage 3a: Secondary | ICD-10-CM | POA: Diagnosis not present

## 2022-02-25 LAB — BASIC METABOLIC PANEL
Anion gap: 10 (ref 5–15)
BUN: 45 mg/dL — ABNORMAL HIGH (ref 8–23)
CO2: 32 mmol/L (ref 22–32)
Calcium: 9.3 mg/dL (ref 8.9–10.3)
Chloride: 97 mmol/L — ABNORMAL LOW (ref 98–111)
Creatinine, Ser: 0.83 mg/dL (ref 0.61–1.24)
GFR, Estimated: >60 mL/min (ref >60)
Glucose, Bld: 135 mg/dL — ABNORMAL HIGH (ref 70–99)
Potassium: 4.2 mmol/L (ref 3.5–5.1)
Sodium: 139 mmol/L (ref 135–145)

## 2022-02-25 LAB — CBC
HCT: 29.5 % — ABNORMAL LOW (ref 39.0–52.0)
Hemoglobin: 9.6 g/dL — ABNORMAL LOW (ref 13.0–17.0)
MCH: 30.1 pg (ref 26.0–34.0)
MCHC: 32.5 g/dL (ref 30.0–36.0)
MCV: 92.5 fL (ref 80.0–100.0)
Platelets: 277 10*3/uL (ref 150–400)
RBC: 3.19 MIL/uL — ABNORMAL LOW (ref 4.22–5.81)
RDW: 15.5 % (ref 11.5–15.5)
WBC: 8.9 10*3/uL (ref 4.0–10.5)
nRBC: 0 % (ref 0.0–0.2)

## 2022-02-25 LAB — PHOSPHORUS: Phosphorus: 4.6 mg/dL (ref 2.5–4.6)

## 2022-02-25 LAB — MAGNESIUM: Magnesium: 2.3 mg/dL (ref 1.7–2.4)

## 2022-02-25 NOTE — Progress Notes (Cosign Needed)
Pulmonary Critical Care Medicine [place]   PULMONARY CRITICAL CARE SERVICE  PROGRESS NOTE     Kenneth Fischer  [name]  DOB: 14-Mar-1950   DOA: 12/20/2021  Referring Physician: Satira Sark, MD  HPI: Kenneth Fischer is a 72 y.o. male being followed for ventilator/airway/oxygen weaning Acute on Chronic Respiratory Failure.  Patient seen lying in bed, currently on ATC 35% with PMV in place.  Will continue with 16 hours of ATC during the day with full support ventilation 8 hours at night.  Medications: Reviewed on Rounds  Physical Exam:  Vitals: Temp 97.3, pulse 70, respirations 31, BP 113/87, SpO2 100%  Ventilator Settings ATC 35% with PMV in place  General: Comfortable at this time Neck: supple Cardiovascular: no malignant arrhythmias Respiratory: Bilaterally diminished Skin: no rash seen on limited exam Musculoskeletal: No gross abnormality Psychiatric:unable to assess Neurologic:no involuntary movements         Lab Data:   Basic Metabolic Panel: Recent Labs  Lab 02/19/22 1646 02/21/22 0202 02/25/22 0115  NA  --  134* 139  K 4.8 4.8 4.2  CL  --  95* 97*  CO2  --  29 32  GLUCOSE  --  197* 135*  BUN  --  18 45*  CREATININE  --  0.58* 0.83  CALCIUM  --  8.6* 9.3  MG  --  2.2 2.3  PHOS  --   --  4.6    ABG: No results for input(s): "PHART", "PCO2ART", "PO2ART", "HCO3", "O2SAT" in the last 168 hours.  Liver Function Tests: No results for input(s): "AST", "ALT", "ALKPHOS", "BILITOT", "PROT", "ALBUMIN" in the last 168 hours. No results for input(s): "LIPASE", "AMYLASE" in the last 168 hours. No results for input(s): "AMMONIA" in the last 168 hours.  CBC: Recent Labs  Lab 02/21/22 0202 02/25/22 0115  WBC 7.3 8.9  HGB 11.9* 9.6*  HCT 38.3* 29.5*  MCV 90.3 92.5  PLT 105* 277    Cardiac Enzymes: No results for input(s): "CKTOTAL", "CKMB", "CKMBINDEX", "TROPONINI" in the last 168 hours.  BNP (last 3 results) No results for  input(s): "BNP" in the last 8760 hours.  ProBNP (last 3 results) No results for input(s): "PROBNP" in the last 8760 hours.  Radiological Exams: DG CHEST PORT 1 VIEW  Result Date: 02/25/2022 CLINICAL DATA:  72 year old male history of pleural effusion. EXAM: PORTABLE CHEST 1 VIEW COMPARISON:  Chest x-ray 02/19/2022. FINDINGS: A tracheostomy tube is in place with tip 7.7 cm above the carina. Diffuse interstitial prominence and patchy ill-defined airspace consolidation scattered throughout the lungs bilaterally, most evident in the mid to lower lungs (right greater than left), similar to the prior study. Small bilateral pleural effusions. Bibasilar opacities also likely reflect underlying subsegmental atelectasis. No pneumothorax. No evidence of pulmonary edema. Heart size is normal. Upper mediastinal contours are within normal limits. Suture line projecting over the lower left hemithorax incidentally noted. IMPRESSION: 1. The appearance of the chest is most suggestive of severe multilobar bilateral bronchopneumonia with superimposed small bilateral parapneumonic pleural effusions, similar to the recent prior study, as above. Electronically Signed   By: Vinnie Langton M.D.   On: 02/25/2022 07:28    Assessment/Plan Active Problems:   CKD (chronic kidney disease), stage III (HCC)   COPD, severe (HCC)   Acute on chronic respiratory failure with hypoxia (HCC)   Healthcare-associated pneumonia   Chronic heart failure with preserved ejection fraction (HCC)  Acute on chronic respiratory failure hypoxia plan is going to be to continue  with T collar and PMV during the daytime go back on the ventilator at night Severe COPD medical management will continue to follow along closely Chronic kidney disease stage III supportive care Healthcare associated pneumonia has been treated Chronic heart failure preserved ejection fraction appears to be compensated   I have personally seen and evaluated the patient,  evaluated laboratory and imaging results, formulated the assessment and plan and placed orders. The Patient requires high complexity decision making with multiple systems involvement.  Rounds were done with the Respiratory Therapy Director and Staff therapists and discussed with nursing staff also.  Allyne Gee, MD [facility] Pulmonary Critical Care Medicine Sleep Medicine

## 2022-02-25 NOTE — Progress Notes (Cosign Needed)
Pulmonary Critical Care Medicine [place]   PULMONARY CRITICAL CARE SERVICE  PROGRESS NOTE     Kenneth Fischer  [id number]  DOB: [birth year]   DOA: 12/20/2021  Referring Physician: Satira Sark, MD  HPI: Kenneth Fischer is a 72 y.o. male being followed for ventilator/airway/oxygen weaning Acute on Chronic Respiratory Failure.  Patient seen lying in bed, currently remains on ATC during the day with full support ventilation at night.  Medications: Reviewed on Rounds  Physical Exam:  Vitals: Temp 98.1, pulse 69, respirations 21, BP 112/63, SpO2 100%  Ventilator Settings ATC 28%  General: Comfortable at this time Neck: supple Cardiovascular: no malignant arrhythmias Respiratory: Bilaterally diminished Skin: no rash seen on limited exam Musculoskeletal: No gross abnormality Psychiatric:unable to assess Neurologic:no involuntary movements         Lab Data:   Basic Metabolic Panel: Recent Labs  Lab 02/19/22 1646 02/21/22 0202 02/25/22 0115  NA  --  134* 139  K 4.8 4.8 4.2  CL  --  95* 97*  CO2  --  29 32  GLUCOSE  --  197* 135*  BUN  --  18 45*  CREATININE  --  0.58* 0.83  CALCIUM  --  8.6* 9.3  MG  --  2.2 2.3  PHOS  --   --  4.6    ABG: No results for input(s): "PHART", "PCO2ART", "PO2ART", "HCO3", "O2SAT" in the last 168 hours.  Liver Function Tests: No results for input(s): "AST", "ALT", "ALKPHOS", "BILITOT", "PROT", "ALBUMIN" in the last 168 hours. No results for input(s): "LIPASE", "AMYLASE" in the last 168 hours. No results for input(s): "AMMONIA" in the last 168 hours.  CBC: Recent Labs  Lab 02/21/22 0202 02/25/22 0115  WBC 7.3 8.9  HGB 11.9* 9.6*  HCT 38.3* 29.5*  MCV 90.3 92.5  PLT 105* 277    Cardiac Enzymes: No results for input(s): "CKTOTAL", "CKMB", "CKMBINDEX", "TROPONINI" in the last 168 hours.  BNP (last 3 results) No results for input(s): "BNP" in the last 8760 hours.  ProBNP (last 3 results) No  results for input(s): "PROBNP" in the last 8760 hours.  Radiological Exams: DG CHEST PORT 1 VIEW  Result Date: 02/25/2022 CLINICAL DATA:  72 year old male history of pleural effusion. EXAM: PORTABLE CHEST 1 VIEW COMPARISON:  Chest x-ray 02/19/2022. FINDINGS: A tracheostomy tube is in place with tip 7.7 cm above the carina. Diffuse interstitial prominence and patchy ill-defined airspace consolidation scattered throughout the lungs bilaterally, most evident in the mid to lower lungs (right greater than left), similar to the prior study. Small bilateral pleural effusions. Bibasilar opacities also likely reflect underlying subsegmental atelectasis. No pneumothorax. No evidence of pulmonary edema. Heart size is normal. Upper mediastinal contours are within normal limits. Suture line projecting over the lower left hemithorax incidentally noted. IMPRESSION: 1. The appearance of the chest is most suggestive of severe multilobar bilateral bronchopneumonia with superimposed small bilateral parapneumonic pleural effusions, similar to the recent prior study, as above. Electronically Signed   By: Kenneth Fischer M.D.   On: 02/25/2022 07:28    Assessment/Plan Active Problems:   CKD (chronic kidney disease), stage III (HCC)   COPD, severe (HCC)   Acute on chronic respiratory failure with hypoxia (HCC)   Healthcare-associated pneumonia   Chronic heart failure with preserved ejection fraction (HCC)   Acute on chronic respiratory failure hypoxia plan is going to be to continue with T collar and PMV during the daytime go back on the ventilator at night Severe COPD  medical management will continue to follow along closely Chronic kidney disease stage III supportive care Healthcare associated pneumonia has been treated Chronic heart failure preserved ejection fraction appears to be compensated   I have personally seen and evaluated the patient, evaluated laboratory and imaging results, formulated the assessment  and plan and placed orders. The Patient requires high complexity decision making with multiple systems involvement.  Rounds were done with the Respiratory Therapy Director and Staff therapists and discussed with nursing staff also.  Kenneth Gee, MD [facility] Pulmonary Critical Care Medicine Sleep Medicine

## 2022-02-25 NOTE — Progress Notes (Cosign Needed)
Pulmonary Critical Care Medicine [place]   PULMONARY CRITICAL CARE SERVICE  PROGRESS NOTE     Kenneth Fischer  VOJ:[id number]  DOB: 05-03-1950   DOA: 12/20/2021  Referring Physician: Satira Sark, MD  HPI: Kenneth Fischer is a 72 y.o. male being followed for ventilator/airway/oxygen weaning Acute on Chronic Respiratory Failure.  Patient seen lying in bed, currently family is at bedside.  Currently remains on ATC 28%.  Will continue 16 hours ATC during the day and full support ventilation at night.  Medications: Reviewed on Rounds  Physical Exam:  Vitals: Temp 97.1, pulse 71, respirations 17, BP 1 1/55, SpO2 99 percent  Ventilator Settings ATC 28%  General: Comfortable at this time Neck: supple Cardiovascular: no malignant arrhythmias Respiratory: Bilaterally diminished Skin: no rash seen on limited exam Musculoskeletal: No gross abnormality Psychiatric:unable to assess Neurologic:no involuntary movements         Lab Data:   Basic Metabolic Panel: Recent Labs  Lab 02/19/22 1646 02/21/22 0202 02/25/22 0115  NA  --  134* 139  K 4.8 4.8 4.2  CL  --  95* 97*  CO2  --  29 32  GLUCOSE  --  197* 135*  BUN  --  18 45*  CREATININE  --  0.58* 0.83  CALCIUM  --  8.6* 9.3  MG  --  2.2 2.3  PHOS  --   --  4.6    ABG: No results for input(s): "PHART", "PCO2ART", "PO2ART", "HCO3", "O2SAT" in the last 168 hours.  Liver Function Tests: No results for input(s): "AST", "ALT", "ALKPHOS", "BILITOT", "PROT", "ALBUMIN" in the last 168 hours. No results for input(s): "LIPASE", "AMYLASE" in the last 168 hours. No results for input(s): "AMMONIA" in the last 168 hours.  CBC: Recent Labs  Lab 02/21/22 0202 02/25/22 0115  WBC 7.3 8.9  HGB 11.9* 9.6*  HCT 38.3* 29.5*  MCV 90.3 92.5  PLT 105* 277    Cardiac Enzymes: No results for input(s): "CKTOTAL", "CKMB", "CKMBINDEX", "TROPONINI" in the last 168 hours.  BNP (last 3 results) No results for  input(s): "BNP" in the last 8760 hours.  ProBNP (last 3 results) No results for input(s): "PROBNP" in the last 8760 hours.  Radiological Exams: DG CHEST PORT 1 VIEW  Result Date: 02/25/2022 CLINICAL DATA:  72 year old male history of pleural effusion. EXAM: PORTABLE CHEST 1 VIEW COMPARISON:  Chest x-ray 02/19/2022. FINDINGS: A tracheostomy tube is in place with tip 7.7 cm above the carina. Diffuse interstitial prominence and patchy ill-defined airspace consolidation scattered throughout the lungs bilaterally, most evident in the mid to lower lungs (right greater than left), similar to the prior study. Small bilateral pleural effusions. Bibasilar opacities also likely reflect underlying subsegmental atelectasis. No pneumothorax. No evidence of pulmonary edema. Heart size is normal. Upper mediastinal contours are within normal limits. Suture line projecting over the lower left hemithorax incidentally noted. IMPRESSION: 1. The appearance of the chest is most suggestive of severe multilobar bilateral bronchopneumonia with superimposed small bilateral parapneumonic pleural effusions, similar to the recent prior study, as above. Electronically Signed   By: Vinnie Langton M.D.   On: 02/25/2022 07:28    Assessment/Plan Active Problems:   CKD (chronic kidney disease), stage III (HCC)   COPD, severe (HCC)   Acute on chronic respiratory failure with hypoxia (HCC)   Healthcare-associated pneumonia   Chronic heart failure with preserved ejection fraction (HCC)   Acute on chronic respiratory failure hypoxia plan is going to be to continue with T  collar and PMV during the daytime go back on the ventilator at night Severe COPD medical management will continue to follow along closely Chronic kidney disease stage III supportive care Healthcare associated pneumonia has been treated Chronic heart failure preserved ejection fraction appears to be compensated   I have personally seen and evaluated the patient,  evaluated laboratory and imaging results, formulated the assessment and plan and placed orders. The Patient requires high complexity decision making with multiple systems involvement.  Rounds were done with the Respiratory Therapy Director and Staff therapists and discussed with nursing staff also.  Allyne Gee, MD [facility] Pulmonary Critical Care Medicine Sleep Medicine

## 2022-02-25 NOTE — Progress Notes (Addendum)
Pulmonary Critical Care Medicine [place]   PULMONARY CRITICAL CARE SERVICE  PROGRESS NOTE     Kalven Argent  [id number]  DOB: 1950-08-24   DOA: 12/20/2021  Referring Physician: Satira Sark, MD  HPI: Kenneth Fischer is a 72 y.o. male being followed for ventilator/airway/oxygen weaning Acute on Chronic Respiratory Failure. ***  Medications: Reviewed on Rounds  Physical Exam:  Vitals: ***  Ventilator Settings ***  General: Comfortable at this time Neck: supple Cardiovascular: no malignant arrhythmias Respiratory: *** Skin: no rash seen on limited exam Musculoskeletal: No gross abnormality Psychiatric:unable to assess Neurologic:no involuntary movements         Lab Data:   Basic Metabolic Panel: Recent Labs  Lab 02/19/22 1646 02/21/22 0202 02/25/22 0115  NA  --  134* 139  K 4.8 4.8 4.2  CL  --  95* 97*  CO2  --  29 32  GLUCOSE  --  197* 135*  BUN  --  18 45*  CREATININE  --  0.58* 0.83  CALCIUM  --  8.6* 9.3  MG  --  2.2 2.3  PHOS  --   --  4.6    ABG: No results for input(s): "PHART", "PCO2ART", "PO2ART", "HCO3", "O2SAT" in the last 168 hours.  Liver Function Tests: No results for input(s): "AST", "ALT", "ALKPHOS", "BILITOT", "PROT", "ALBUMIN" in the last 168 hours. No results for input(s): "LIPASE", "AMYLASE" in the last 168 hours. No results for input(s): "AMMONIA" in the last 168 hours.  CBC: Recent Labs  Lab 02/21/22 0202 02/25/22 0115  WBC 7.3 8.9  HGB 11.9* 9.6*  HCT 38.3* 29.5*  MCV 90.3 92.5  PLT 105* 277    Cardiac Enzymes: No results for input(s): "CKTOTAL", "CKMB", "CKMBINDEX", "TROPONINI" in the last 168 hours.  BNP (last 3 results) No results for input(s): "BNP" in the last 8760 hours.  ProBNP (last 3 results) No results for input(s): "PROBNP" in the last 8760 hours.  Radiological Exams: DG CHEST PORT 1 VIEW  Result Date: 02/25/2022 CLINICAL DATA:  72 year old male history of pleural effusion.  EXAM: PORTABLE CHEST 1 VIEW COMPARISON:  Chest x-ray 02/19/2022. FINDINGS: A tracheostomy tube is in place with tip 7.7 cm above the carina. Diffuse interstitial prominence and patchy ill-defined airspace consolidation scattered throughout the lungs bilaterally, most evident in the mid to lower lungs (right greater than left), similar to the prior study. Small bilateral pleural effusions. Bibasilar opacities also likely reflect underlying subsegmental atelectasis. No pneumothorax. No evidence of pulmonary edema. Heart size is normal. Upper mediastinal contours are within normal limits. Suture line projecting over the lower left hemithorax incidentally noted. IMPRESSION: 1. The appearance of the chest is most suggestive of severe multilobar bilateral bronchopneumonia with superimposed small bilateral parapneumonic pleural effusions, similar to the recent prior study, as above. Electronically Signed   By: Vinnie Langton M.D.   On: 02/25/2022 07:28    Assessment/Plan Active Problems:   CKD (chronic kidney disease), stage III (HCC)   COPD, severe (HCC)   Acute on chronic respiratory failure with hypoxia (HCC)   Healthcare-associated pneumonia   Chronic heart failure with preserved ejection fraction (HCC)   Acute on chronic respiratory failure hypoxia plan is going to be to continue with T collar and PMV during the daytime go back on the ventilator at night Severe COPD medical management will continue to follow along closely Chronic kidney disease stage III supportive care Healthcare associated pneumonia has been treated Chronic heart failure preserved ejection fraction appears to  be compensated   I have personally seen and evaluated the patient, evaluated laboratory and imaging results, formulated the assessment and plan and placed orders. The Patient requires high complexity decision making with multiple systems involvement.  Rounds were done with the Respiratory Therapy Director and Staff  therapists and discussed with nursing staff also.  Allyne Gee, MD [facility] Pulmonary Critical Care Medicine Sleep Medicine

## 2022-02-26 DIAGNOSIS — J449 Chronic obstructive pulmonary disease, unspecified: Secondary | ICD-10-CM | POA: Diagnosis not present

## 2022-02-26 DIAGNOSIS — N1831 Chronic kidney disease, stage 3a: Secondary | ICD-10-CM | POA: Diagnosis not present

## 2022-02-26 DIAGNOSIS — I5032 Chronic diastolic (congestive) heart failure: Secondary | ICD-10-CM | POA: Diagnosis not present

## 2022-02-26 DIAGNOSIS — J9621 Acute and chronic respiratory failure with hypoxia: Secondary | ICD-10-CM | POA: Diagnosis not present

## 2022-02-27 ENCOUNTER — Other Ambulatory Visit (HOSPITAL_COMMUNITY): Payer: Medicare Other

## 2022-11-12 IMAGING — CR DG CHEST 2V
2 series · 2 of 2 positions shown · non-contrast
Comparison: Correlation made with prior chest CT

CLINICAL DATA: Preoperative evaluation, lung mass

EXAM:
CHEST - 2 VIEW

[w chest pa]
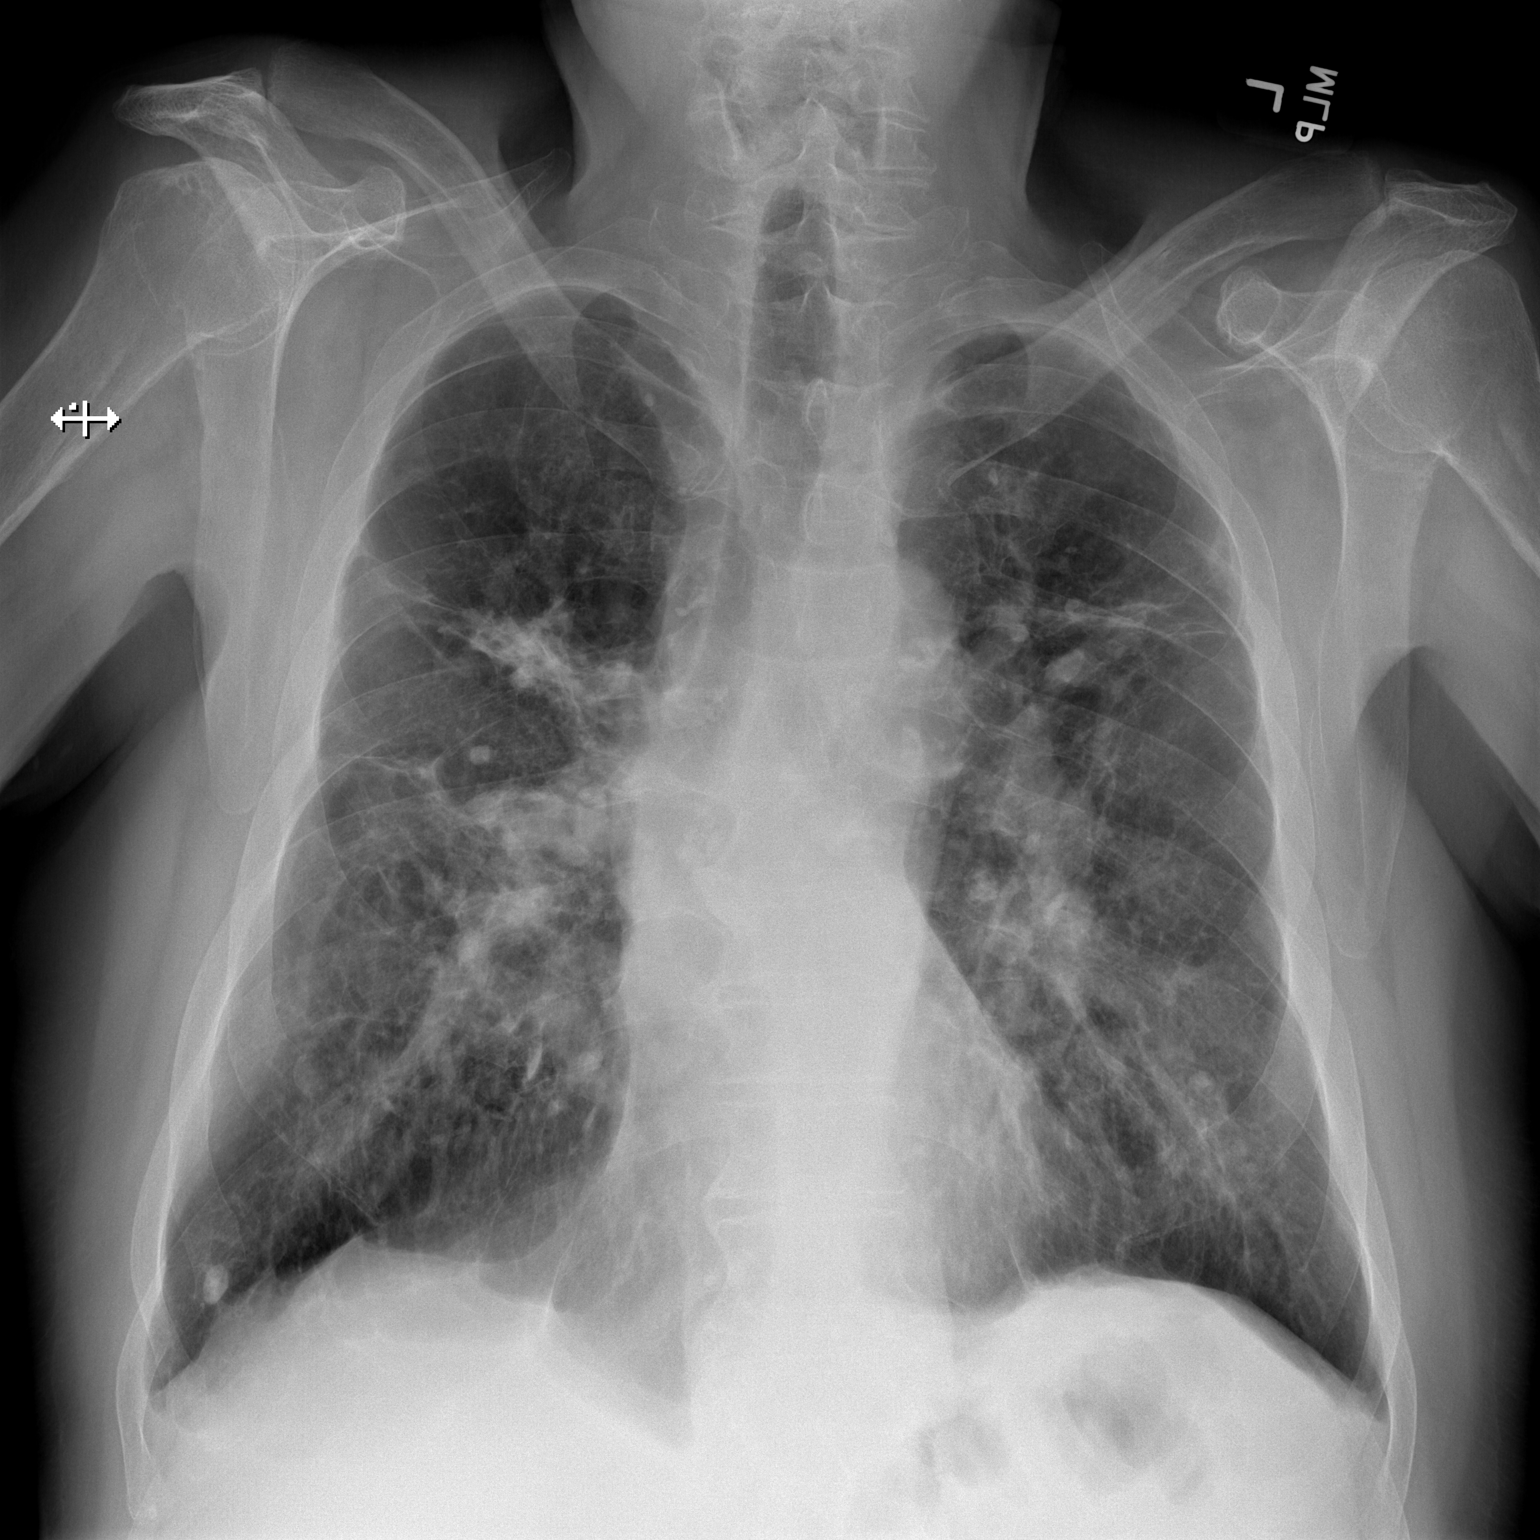

[w chest lat]
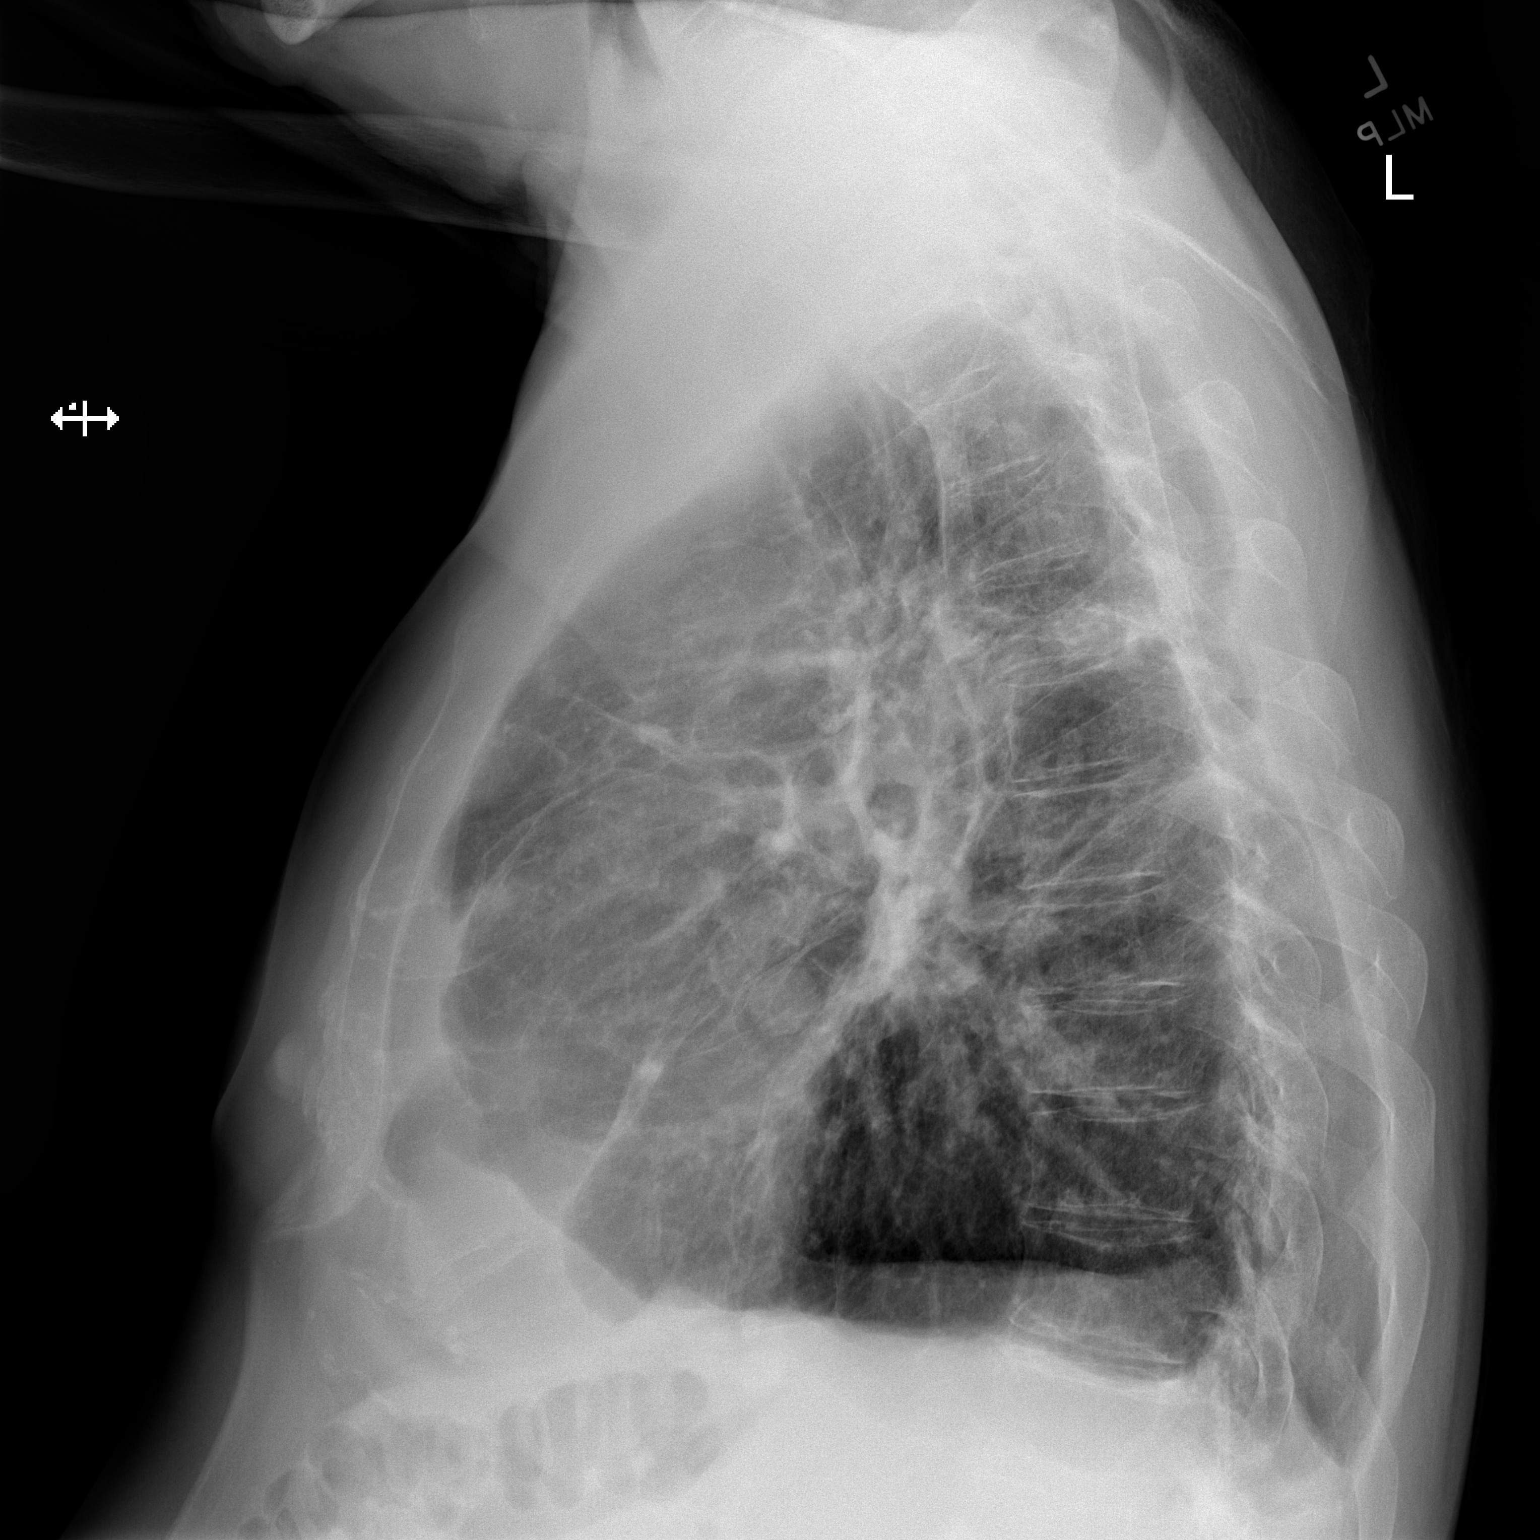

[2 of 2 positions shown; findings below may reference images not displayed]

FINDINGS: There are bilateral areas of scarring and architectural distortion
with calcification. Left lower lobe nodule better seen on CT. No
pleural effusion. Calcified mediastinal and hilar lymph nodes.
Normal heart size.
IMPRESSION: Left lower lobe nodule better seen on CT. Bilateral areas of
scarring and architectural distortion with calcification.

## 2022-11-14 IMAGING — DX DG CHEST 1V PORT
1 series · 1 of 1 positions shown · non-contrast
Comparison: Radiograph earlier today. Preoperative imaging also
reviewed.

CLINICAL DATA: Chest tube placement post thoracotomy.

EXAM:
PORTABLE CHEST 1 VIEW

[chest ap]
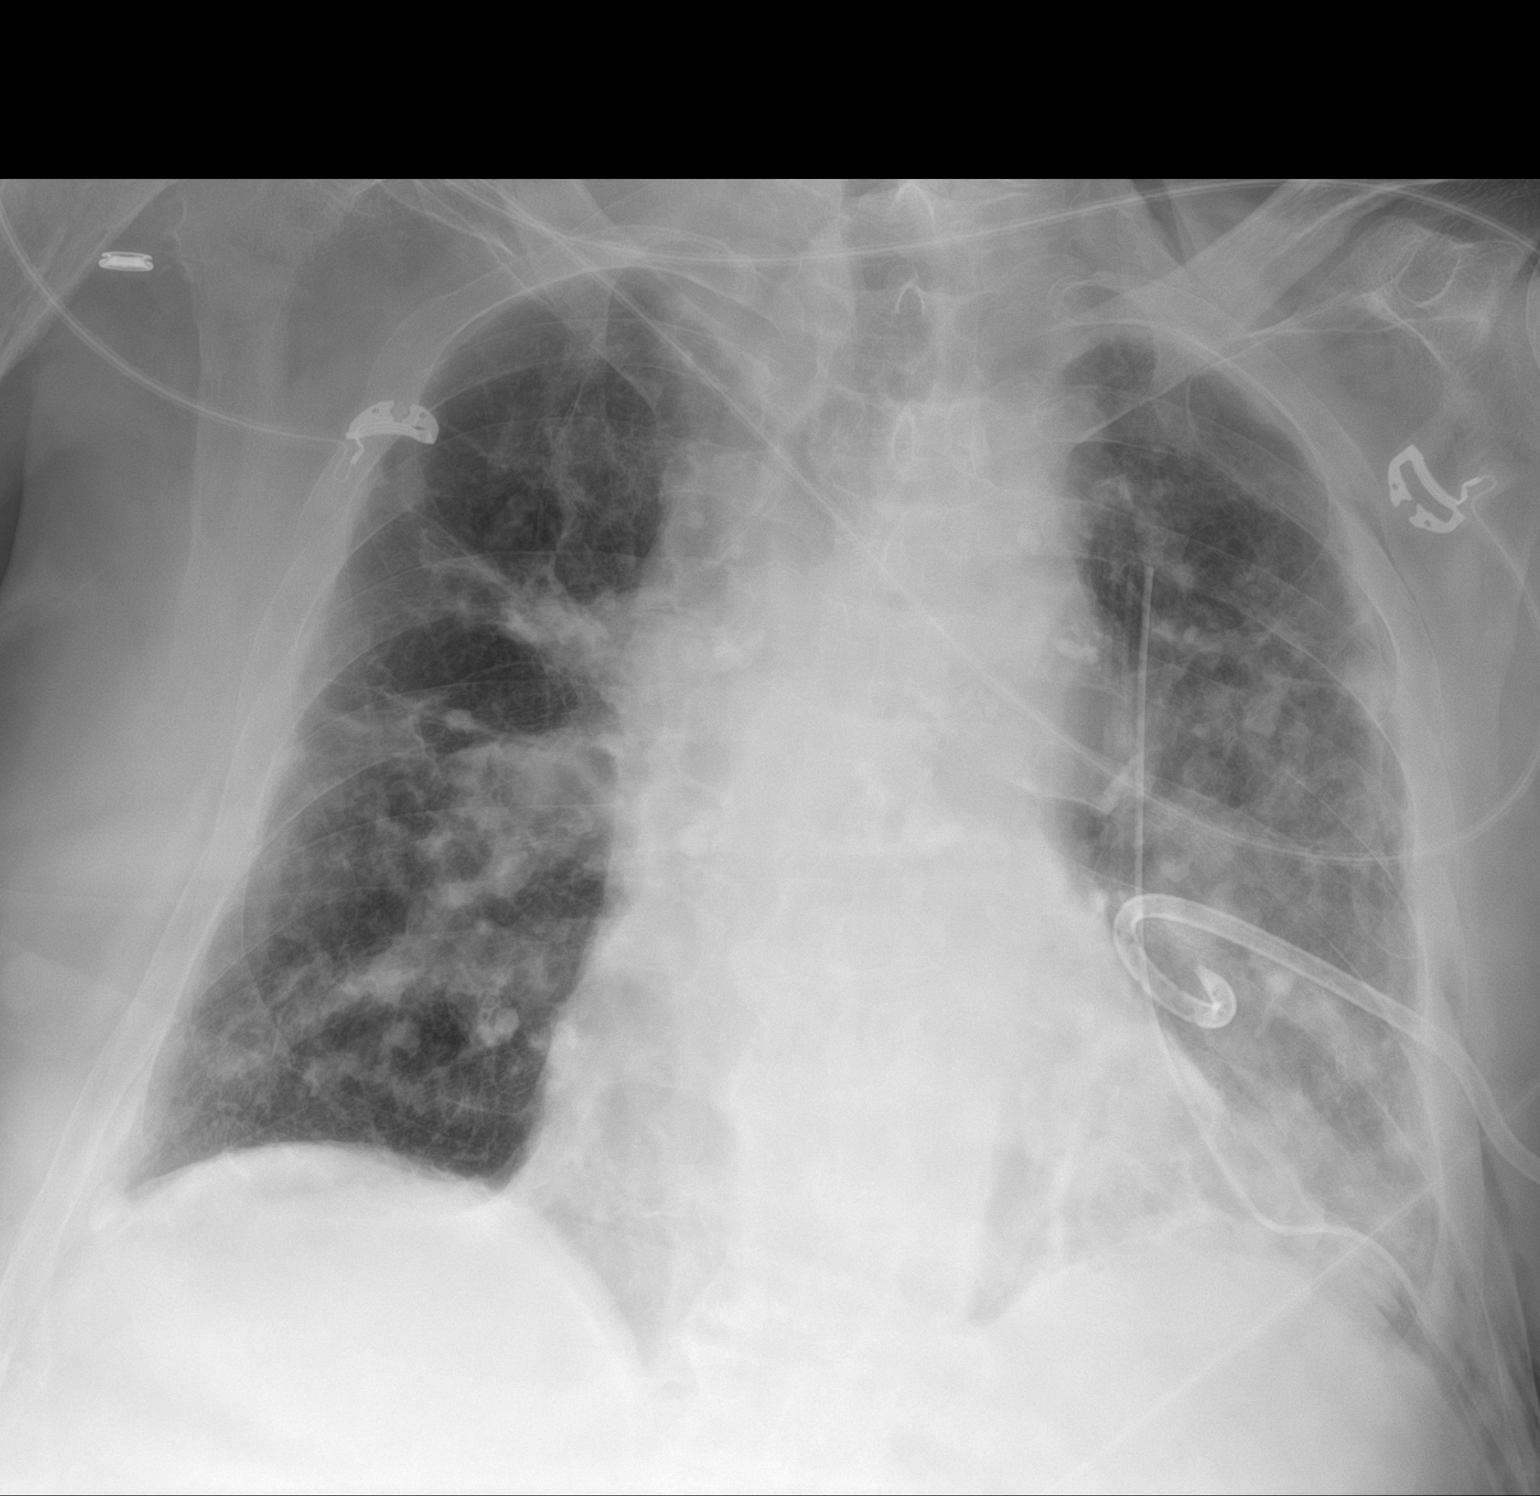

[1 of 1 positions shown; findings below may reference images not displayed]

FINDINGS: Interval extubation. Right-sided chest tube as well as pigtail
catheter in place, large-bore chest tube coursing towards the apex
pigtail catheter in the mid hemithorax. Subcutaneous emphysema along
the right chest wall. There may be a small right basilar
pneumothorax, difficult to assess due to overlying emphysema. Stable
heart size and mediastinal contours. Streaky right suprahilar and
perihilar opacities similar to preoperative exam. Patchy opacities
throughout the left hemithorax. Chronic lung markings on the right.
No significant pleural effusion.
IMPRESSION: 1. Interval extubation. Right-sided chest tubes in place.
Subcutaneous emphysema along the right chest wall.
2. Questionable small right basilar pneumothorax, difficult to
assess due to overlying soft tissue emphysema. Recommend attention
at follow-up.
3. Underlying chronic lung disease.

## 2023-10-15 DEATH — deceased
